# Patient Record
Sex: Female | Born: 1975 | Race: Black or African American | Hispanic: No | Smoking: Never smoker
Health system: Southern US, Community
[De-identification: ages and names within clinical notes are randomized; demographics above are authoritative.]

## PROBLEM LIST (undated history)

## (undated) DIAGNOSIS — E669 Obesity, unspecified: Secondary | ICD-10-CM

## (undated) DIAGNOSIS — Z8719 Personal history of other diseases of the digestive system: Secondary | ICD-10-CM

## (undated) DIAGNOSIS — E668 Other obesity: Secondary | ICD-10-CM

## (undated) DIAGNOSIS — R51 Headache: Secondary | ICD-10-CM

## (undated) DIAGNOSIS — E119 Type 2 diabetes mellitus without complications: Secondary | ICD-10-CM

## (undated) DIAGNOSIS — G4733 Obstructive sleep apnea (adult) (pediatric): Secondary | ICD-10-CM

## (undated) DIAGNOSIS — K219 Gastro-esophageal reflux disease without esophagitis: Secondary | ICD-10-CM

## (undated) DIAGNOSIS — Z9989 Dependence on other enabling machines and devices: Secondary | ICD-10-CM

## (undated) DIAGNOSIS — R0789 Other chest pain: Secondary | ICD-10-CM

## (undated) DIAGNOSIS — I10 Essential (primary) hypertension: Secondary | ICD-10-CM

## (undated) DIAGNOSIS — H33309 Unspecified retinal break, unspecified eye: Secondary | ICD-10-CM

## (undated) HISTORY — PX: ABDOMINAL HYSTERECTOMY: SHX81

## (undated) HISTORY — DX: Unspecified retinal break, unspecified eye: H33.309

## (undated) HISTORY — PX: TUBAL LIGATION: SHX77

## (undated) HISTORY — DX: Obstructive sleep apnea (adult) (pediatric): G47.33

---

## 1997-05-12 HISTORY — PX: WISDOM TOOTH EXTRACTION: SHX21

## 2006-09-27 ENCOUNTER — Emergency Department (HOSPITAL_COMMUNITY): Admission: EM | Admit: 2006-09-27 | Discharge: 2006-09-28 | Payer: Self-pay | Admitting: Emergency Medicine

## 2006-11-25 ENCOUNTER — Ambulatory Visit (HOSPITAL_COMMUNITY): Admission: RE | Admit: 2006-11-25 | Discharge: 2006-11-25 | Payer: Self-pay | Admitting: Obstetrics

## 2007-01-01 ENCOUNTER — Ambulatory Visit (HOSPITAL_COMMUNITY): Admission: RE | Admit: 2007-01-01 | Discharge: 2007-01-01 | Payer: Self-pay | Admitting: Obstetrics

## 2007-01-15 ENCOUNTER — Ambulatory Visit (HOSPITAL_COMMUNITY): Admission: RE | Admit: 2007-01-15 | Discharge: 2007-01-15 | Payer: Self-pay | Admitting: Obstetrics

## 2007-04-02 ENCOUNTER — Ambulatory Visit (HOSPITAL_COMMUNITY): Admission: RE | Admit: 2007-04-02 | Discharge: 2007-04-02 | Payer: Self-pay | Admitting: Obstetrics & Gynecology

## 2007-04-19 ENCOUNTER — Inpatient Hospital Stay (HOSPITAL_COMMUNITY): Admission: AD | Admit: 2007-04-19 | Discharge: 2007-04-19 | Payer: Self-pay | Admitting: Obstetrics

## 2007-05-09 ENCOUNTER — Inpatient Hospital Stay (HOSPITAL_COMMUNITY): Admission: AD | Admit: 2007-05-09 | Discharge: 2007-05-09 | Payer: Self-pay | Admitting: Obstetrics

## 2007-05-14 ENCOUNTER — Inpatient Hospital Stay (HOSPITAL_COMMUNITY): Admission: AD | Admit: 2007-05-14 | Discharge: 2007-05-14 | Payer: Self-pay | Admitting: Obstetrics & Gynecology

## 2007-05-15 ENCOUNTER — Inpatient Hospital Stay (HOSPITAL_COMMUNITY): Admission: AD | Admit: 2007-05-15 | Discharge: 2007-05-16 | Payer: Self-pay | Admitting: Obstetrics & Gynecology

## 2007-05-24 ENCOUNTER — Inpatient Hospital Stay (HOSPITAL_COMMUNITY): Admission: AD | Admit: 2007-05-24 | Discharge: 2007-05-24 | Payer: Self-pay | Admitting: Obstetrics & Gynecology

## 2007-05-25 ENCOUNTER — Inpatient Hospital Stay (HOSPITAL_COMMUNITY): Admission: AD | Admit: 2007-05-25 | Discharge: 2007-05-30 | Payer: Self-pay | Admitting: Obstetrics

## 2007-05-28 ENCOUNTER — Encounter: Payer: Self-pay | Admitting: Obstetrics & Gynecology

## 2007-09-26 ENCOUNTER — Emergency Department (HOSPITAL_COMMUNITY): Admission: EM | Admit: 2007-09-26 | Discharge: 2007-09-26 | Payer: Self-pay | Admitting: Emergency Medicine

## 2008-03-09 ENCOUNTER — Emergency Department (HOSPITAL_COMMUNITY): Admission: EM | Admit: 2008-03-09 | Discharge: 2008-03-09 | Payer: Self-pay | Admitting: Emergency Medicine

## 2008-06-13 ENCOUNTER — Emergency Department (HOSPITAL_COMMUNITY): Admission: EM | Admit: 2008-06-13 | Discharge: 2008-06-13 | Payer: Self-pay | Admitting: Emergency Medicine

## 2008-09-15 ENCOUNTER — Emergency Department (HOSPITAL_COMMUNITY): Admission: EM | Admit: 2008-09-15 | Discharge: 2008-09-16 | Payer: Self-pay | Admitting: Emergency Medicine

## 2009-03-06 ENCOUNTER — Emergency Department (HOSPITAL_COMMUNITY): Admission: EM | Admit: 2009-03-06 | Discharge: 2009-03-06 | Payer: Self-pay | Admitting: Emergency Medicine

## 2009-03-26 ENCOUNTER — Encounter: Admission: RE | Admit: 2009-03-26 | Discharge: 2009-03-26 | Payer: Self-pay | Admitting: Physician Assistant

## 2009-03-29 ENCOUNTER — Emergency Department (HOSPITAL_COMMUNITY): Admission: EM | Admit: 2009-03-29 | Discharge: 2009-03-29 | Payer: Self-pay | Admitting: Emergency Medicine

## 2009-04-11 HISTORY — PX: HERNIA REPAIR: SHX51

## 2009-09-13 ENCOUNTER — Ambulatory Visit: Payer: Self-pay | Admitting: Diagnostic Radiology

## 2009-09-13 ENCOUNTER — Emergency Department (HOSPITAL_BASED_OUTPATIENT_CLINIC_OR_DEPARTMENT_OTHER): Admission: EM | Admit: 2009-09-13 | Discharge: 2009-09-13 | Payer: Self-pay | Admitting: Emergency Medicine

## 2009-09-19 ENCOUNTER — Ambulatory Visit: Payer: Self-pay | Admitting: Family

## 2009-09-19 DIAGNOSIS — J45909 Unspecified asthma, uncomplicated: Secondary | ICD-10-CM | POA: Insufficient documentation

## 2009-09-19 DIAGNOSIS — J309 Allergic rhinitis, unspecified: Secondary | ICD-10-CM | POA: Insufficient documentation

## 2009-10-17 ENCOUNTER — Ambulatory Visit: Payer: Self-pay | Admitting: Family

## 2009-10-17 LAB — CONVERTED CEMR LAB
Albumin: 4.1 g/dL (ref 3.5–5.2)
Alkaline Phosphatase: 68 units/L (ref 39–117)
BUN: 13 mg/dL (ref 6–23)
Basophils Absolute: 0 10*3/uL (ref 0.0–0.1)
Basophils Relative: 0 % (ref 0–1)
Eosinophils Absolute: 0.6 10*3/uL (ref 0.0–0.7)
Eosinophils Relative: 7 % — ABNORMAL HIGH (ref 0–5)
Glucose, Bld: 92 mg/dL (ref 70–99)
HCT: 39.9 % (ref 36.0–46.0)
HDL: 44 mg/dL (ref >39)
Hemoglobin: 12.8 g/dL (ref 12.0–15.0)
LDL Cholesterol: 67 mg/dL (ref 0–99)
MCHC: 32.1 g/dL (ref 30.0–36.0)
MCV: 89.1 fL (ref 78.0–100.0)
Monocytes Absolute: 0.7 10*3/uL (ref 0.1–1.0)
Potassium: 5.2 meq/L (ref 3.5–5.3)
RDW: 14.3 % (ref 11.5–15.5)
Triglycerides: 83 mg/dL (ref <150)

## 2009-10-18 ENCOUNTER — Encounter: Payer: Self-pay | Admitting: Family

## 2009-10-23 ENCOUNTER — Encounter: Payer: Self-pay | Admitting: Family

## 2009-12-04 ENCOUNTER — Ambulatory Visit: Payer: Self-pay | Admitting: Internal Medicine

## 2010-03-12 LAB — HM PAP SMEAR: HM Pap smear: NORMAL

## 2010-03-13 ENCOUNTER — Ambulatory Visit: Payer: Self-pay | Admitting: Family

## 2010-03-13 ENCOUNTER — Ambulatory Visit (HOSPITAL_BASED_OUTPATIENT_CLINIC_OR_DEPARTMENT_OTHER): Admission: RE | Admit: 2010-03-13 | Discharge: 2010-03-13 | Payer: Self-pay | Admitting: Internal Medicine

## 2010-03-13 ENCOUNTER — Ambulatory Visit: Payer: Self-pay | Admitting: Diagnostic Radiology

## 2010-03-13 DIAGNOSIS — M25559 Pain in unspecified hip: Secondary | ICD-10-CM | POA: Insufficient documentation

## 2010-03-13 DIAGNOSIS — M25569 Pain in unspecified knee: Secondary | ICD-10-CM

## 2010-03-22 ENCOUNTER — Ambulatory Visit: Payer: Self-pay | Admitting: Family Medicine

## 2010-03-25 ENCOUNTER — Encounter: Payer: Self-pay | Admitting: Family Medicine

## 2010-04-17 ENCOUNTER — Ambulatory Visit: Payer: Self-pay | Admitting: Family

## 2010-04-18 ENCOUNTER — Encounter: Payer: Self-pay | Admitting: Family

## 2010-04-24 ENCOUNTER — Ambulatory Visit: Payer: Self-pay | Admitting: Family

## 2010-05-01 ENCOUNTER — Encounter: Admit: 2010-05-01 | Payer: Self-pay | Attending: Internal Medicine | Admitting: Internal Medicine

## 2010-06-04 ENCOUNTER — Ambulatory Visit
Admission: RE | Admit: 2010-06-04 | Discharge: 2010-06-04 | Payer: Self-pay | Source: Home / Self Care | Attending: Family | Admitting: Family

## 2010-06-04 ENCOUNTER — Encounter: Payer: Self-pay | Admitting: Family

## 2010-06-04 DIAGNOSIS — A088 Other specified intestinal infections: Secondary | ICD-10-CM | POA: Insufficient documentation

## 2010-06-04 LAB — CONVERTED CEMR LAB
Basophils Absolute: 0 10*3/uL (ref 0.0–0.1)
Basophils Relative: 0 % (ref 0–1)
Bilirubin, Direct: 0.1 mg/dL (ref 0.0–0.3)
Calcium: 9.1 mg/dL (ref 8.4–10.5)
Creatinine, Ser: 0.7 mg/dL (ref 0.40–1.20)
Eosinophils Absolute: 0.4 10*3/uL (ref 0.0–0.7)
Eosinophils Relative: 4 % (ref 0–5)
HCT: 37.2 % (ref 36.0–46.0)
Hemoglobin: 12.1 g/dL (ref 12.0–15.0)
Indirect Bilirubin: 0.2 mg/dL (ref 0.0–0.9)
MCHC: 32.5 g/dL (ref 30.0–36.0)
Monocytes Absolute: 0.7 10*3/uL (ref 0.1–1.0)
Neutro Abs: 4.8 10*3/uL (ref 1.7–7.7)
RDW: 13.7 % (ref 11.5–15.5)
Total Bilirubin: 0.3 mg/dL (ref 0.3–1.2)

## 2010-06-05 ENCOUNTER — Encounter: Payer: Self-pay | Admitting: Family

## 2010-06-12 NOTE — Letter (Signed)
   [place] at [facility] [address] [place], [place]  [address]  [place] Phone: [phone]      October 18, 2009   [facility] [address] [place], [place] [address]  RE:  LAB RESULTS  Dear  [name],  The following is an interpretation of your most recent lab tests.  Please take note of any instructions provided or changes to medications that have resulted from your lab work.  ELECTROLYTES:  Good - no changes needed  KIDNEY FUNCTION TESTS:  Good - no changes needed  LIVER FUNCTION TESTS:  Good - no changes needed  LIPID PANEL:  Good - no changes needed Triglyceride: 83   Cholesterol: 128   LDL: 67   HDL: 44   Chol/HDL%:  2.9 Ratio  THYROID STUDIES:  Thyroid studies normal TSH: 1.769     DIABETIC STUDIES:  Excellent - no changes needed Blood Glucose: 92    CBC:  Good - no changes needed   Sincerely Yours,    Lemont Fillers FNP

## 2010-06-12 NOTE — Letter (Signed)
   [place] at [facility] [address] [place], [place]  [address]  [place] Phone: [phone]      October 23, 2009   [facility] [address] [place], [place] [address]  RE:  LAB RESULTS  Dear  Ms. Stukey,  The following is an interpretation of your most recent lab tests.  Please take note of any instructions provided or changes to medications that have resulted from your lab work.  ELECTROLYTES:  Good - no changes needed  KIDNEY FUNCTION TESTS:  Good - no changes needed  LIVER FUNCTION TESTS:  Good - no changes needed  LIPID PANEL:  Good - no changes needed Triglyceride: 83   Cholesterol: 128   LDL: 67   HDL: 44   Chol/HDL%:  2.9 Ratio  THYROID STUDIES:  Thyroid studies normal TSH: 1.769     CBC:  Good - no changes needed   Sincerely Yours,    Lemont Fillers FNP

## 2010-06-12 NOTE — Assessment & Plan Note (Signed)
Summary: LEFT HIP PAIN/NP/LP   Vital Signs:  Patient profile:   35 year old female Height:      66.75 inches (169.55 cm) Weight:      363.8 pounds (165.36 kg) BMI:     57.61 Temp:     97.9 degrees F (36.61 degrees C) oral Pulse rate:   84 / minute BP sitting:   137 / 84  (right arm)  Vitals Entered By: Baxter Hire) (March 22, 2010 11:51 AM) CC: left hip and lower back pain Pain Assessment Patient in pain? yes     Location: lower back/hip Intensity: 2 Type: throbbing Onset of pain  pain comes and goes  Does patient need assistance? Functional Status Self care Ambulation Normal   Primary Care Provider:  Lemont Fillers FNP  CC:  left hip and lower back pain.  History of Present Illness: 35 yo F presents with intermittent (more persistent recently) low back and left leg pain  Patient reports first had pain with last pregnancy about 3 years ago Remembers falling on ice in 2004 but did not have lingering issues since then Developed low back pain/sciatica/SI joint pain per her report with pregnancy Saw a chiropractor which helped some Describes a stabbing sensation in left low back with radiation into left leg No numbness or tingling but describes feeling of 'ants running up and down my leg' at times No bowel/bladder issues Unable to stay in one position for a length of time No problems with right leg. Has not tried PT.  Has tried heat patches. Aleve irritated her stomach but tolerated ibuprofen.  Habits & Providers  Alcohol-Tobacco-Diet     Alcohol drinks/day: 0     Tobacco Status: never  Problems Prior to Update: 1)  Lumbago  (ICD-724.2) 2)  Knee Pain, Bilateral  (ICD-719.46) 3)  Hip Pain, Left  (ICD-719.45) 4)  Rhinosinusitis, Acute  (ICD-461.8) 5)  Morbid Obesity  (ICD-278.01) 6)  Preventive Health Care  (ICD-V70.0) 7)  Allergic Rhinitis  (ICD-477.9) 8)  Asthma  (ICD-493.90)  Medications Prior to Update: 1)  Albuterol Sulfate (2.5 Mg/86ml)  0.083% Nebu (Albuterol Sulfate) .... Once Daily 2)  Ventolin Hfa 108 (90 Base) Mcg/act Aers (Albuterol Sulfate) .... 2 Puffs As Needed. 3)  Zyrtec Allergy 10 Mg Caps (Cetirizine Hcl) .... One Tablet By Mouth Daily 4)  Flonase 50 Mcg/act Susp (Fluticasone Propionate) .... Two Sprays Each Nostril Once Daily 5)  One-A-Day Extras Antioxidant  Caps (Multiple Vitamins-Minerals) .... Take 1 Capsule By Mouth Once A Day. 6)  Aleve 220 Mg Tabs (Naproxen Sodium) .... One Tablet By Mouth Two Times A Day For One Week, Then Use Only On An As Needed Basis  Allergies: 1)  ! * Latex 2)  ! * Advair 3)  ! Spinach  Family History: Reviewed history from 12/04/2009 and no changes required. Cervical Dysplasia--mother Heart Disease--maternal grandmother HTN--mother, father, maternal / paternal grandparents Manic Depressive, Bipolar-- father, maternal grandmother Diabetes, Type II--mother   Social History: Reviewed history from 10/17/2009 and no changes required. 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  No scoliosis or other abnormalities. TTP in mid lumbar region, L > R paraspinal region as well.  No thoracic TTP.  Mod TTP left SI joint. FROM, pain worse with extension. Strength 4/5 with Left hip flexion, knee extension, flexion, foot dorsiflexion and plantarflexion - ?effort  or limited by pain MSRs trace in bilateral patellar tendons, 1+ in bilateral achilles tendons and equal + Fabers on left SLRs negative bilaterally 2+ dp pulses  L hip negative logroll   Impression & Recommendations:  Problem # 1:  LUMBAGO (ICD-724.2) Assessment New Patient on exam has evidence of SI joint dysfunction as well as lumbar radiculopathy.  Her weakness in left extremity does not fit with a dermatomal distribution and I believe is more limited 2/2 pain  than true weakness.  Morbid obesity a contributing factor to her pain as well - she is trying to become more active, considering joining the aquatic center in [place].  Has seen a chiropractor for back which helped some (does help with SI joint issues - can consider this again).  Treat conservatively - shown SI joint stretches and will go to physical therapy for both issues.  Flexeril for muscle spasms, try a prednisone dosepak with transition to mobic (to call if this also irritates her stomach - she tolerated ibuprofen though).  Weight loss and core strengthening are paramount to her improving in teh long term.  Her updated medication list for this problem includes:    Meloxicam 15 Mg Tabs (Meloxicam) .[name]... 1 tab by mouth daily with food - start after finishing prednisone    Flexeril 10 Mg Tabs (Cyclobenzaprine hcl) .[name]... 1 tab by mouth three times a day as needed spasms  Complete Medication List: 1)  Albuterol Sulfate (2.5 Mg/104ml) 0.083% Nebu (Albuterol sulfate) .... Once daily 2)  Ventolin Hfa 108 (90 Base) Mcg/act Aers (Albuterol sulfate) .... 2 puffs as needed. 3)  Zyrtec Allergy 10 Mg Caps (Cetirizine hcl) .... One tablet by mouth daily 4)  Flonase 50 Mcg/act Susp (Fluticasone propionate) .... Two sprays each nostril once daily 5)  One-a-day Extras Antioxidant Caps (Multiple vitamins-minerals) .... Take 1 capsule by mouth once a day. 6)  Meloxicam 15 Mg Tabs (Meloxicam) .[name].. 1 tab by mouth daily with food - start after finishing prednisone 7)  Prednisone (pak) 10 Mg Tabs (Prednisone) .... Take as directed x 6 days 8)  Flexeril 10 Mg Tabs (Cyclobenzaprine hcl) .[name].. 1 tab by mouth three times a day as needed spasms  Patient Instructions: 1)  You have evidence of SI joint dysfunction and nerve irritation coming out of your back. 2)  Take prednisone dose pack as directed - AFTER FINISHING then start the meloxicam once daily.  Make sure you take these with food. 3)  Flexeril up to three times  a day as needed for muscle spasms but no driving on this. 4)  Heat 15 minutes at a time 3-4 times a day 5)  Stay as active as possible. 6)  Go to physical therapy 2-3 times a week for 6 weeks. 7)  Strengthening your core muscles and low back muscles is important to relieve motion causing the nerve irritation in your back. 8)  Can consider massage, chiropractor, acupuncture. 9)  Follow up with me in 6 weeks for a recheck. Prescriptions: FLEXERIL 10 MG TABS (CYCLOBENZAPRINE HCL) 1 tab by mouth three times a day as needed spasms  #60 x 1   Entered and Authorized by:   Norton Blizzard MD   Signed by:   Norton Blizzard MD on 03/26/2010   Method used:   [facility]        [facility] [address] [phone]* (retail)       [address]       [place]  [place], [place]  [address]       Ph: [phone] or [phone]       Fax: [phone]   RxID:   [id number] PREDNISONE (PAK) 10 MG TABS (PREDNISONE) Take as directed x 6 days  #1 x 0   Entered and Authorized by:   Norton Blizzard MD   Signed by:   Norton Blizzard MD on 03/26/2010   Method used:   [facility]        [facility] [address] [phone]* (retail)       [address]       [place], [place]  [address]       Ph: [phone] or [phone]       Fax: [phone]   RxID:   [id number] MELOXICAM 15 MG TABS (MELOXICAM) 1 tab by mouth daily with food - start AFTER finishing prednisone  #30 x 1   Entered and Authorized by:   Norton Blizzard MD   Signed by:   Norton Blizzard MD on 03/26/2010   Method used:   [facility]        [facility] [address] [phone]* (retail)       [address]       [place], [place]  [address]       Ph: [phone] or [phone]       Fax: [phone]   RxID:   [id number] FLEXERIL 10 MG TABS (CYCLOBENZAPRINE HCL) 1 tab by mouth three times a day as needed spasms  #60 x 1   Entered and Authorized by:   Norton Blizzard MD   Signed by:    Norton Blizzard MD on 03/25/2010   Method used:   [facility]ically [facility]        [facility] [address] [phone]* (retail)       [address]       [place], [place]  [address]       Ph: [phone] or [phone]       Fax: [phone]   RxID:   [id number] PREDNISONE (PAK) 10 MG TABS (PREDNISONE) Take as directed x 6 days  #1 x 0   Entered and Authorized by:   Norton Blizzard MD   Signed by:   Norton Blizzard MD on 03/25/2010   Method used:   [facility]ronically to        [facility] [address] [phone]* (retail)       [address]       [place], [place]  [address]       Ph: [phone] or [phone]       Fax: [phone]   RxID:   [id number] MELOXICAM 15 MG TABS (MELOXICAM) 1 tab by mouth daily with food - start AFTER finishing prednisone  #30 x 1   Entered and Authorized by:   Norton Blizzard MD   Signed by:   Norton Blizzard MD on 03/25/2010   Method used:   [facility]        [facility] [address] [phone]* (retail)       [address]       [place], [place]  [address]       Ph: [phone] or [phone]       Fax: [phone]   RxID:   [id number]  Orders Added: 1)  New Patient Level III [[address]]

## 2010-06-12 NOTE — Assessment & Plan Note (Signed)
Summary: SOB/COUGH/SEEN IN ED/HEA   Vital Signs:  Patient profile:   35 year old female Height:      66.75 inches Weight:      378.75 pounds BMI:     59.98 O2 Sat:      98 % on Room air Temp:     97.7 degrees F oral Pulse rate:   112 / minute Pulse rhythm:   regular Resp:     16 per minute BP sitting:   130 / 78  (right arm) Cuff size:   thigh  Vitals Entered By: Amber Salas CMA (Sep 19, 2009 3:13 PM)  O2 Flow:  Room air CC: room 4  Pt was seen in ER Thursday for difficulty breathing. Has also been diagnosed with sinus infection. Feels like symptoms have returned. Is Patient Diabetic? No   CC:  room 4  Pt was seen in ER Thursday for difficulty breathing. Has also been diagnosed with sinus infection. Feels like symptoms have returned.[name]  History of Present Illness: Amber Salas is a 35 year old female who presents today to establish care.  She was seen in the Emergency Department last Thursday for cough/shortness of breath and was diagnosed with an Acute Asthma Exacerbation.  She was prescribed a spacer and albuterol nebs (which she is using daily) also was given a 5 day course of Prednisone.  Patient tells met that she only took the first dose but then forgot to take the remaining 4 days.  Today she notes overall breathing is feeling better, but now having clear nasal discharge which she is attributes to her allergies.  She has not had a primary care provider in some time- except a few visits in [facility] for evaluation of an umbilical hernia.  Preventive Screening-Counseling & Management  Alcohol-Tobacco     Alcohol drinks/day: 0     Smoking Status: never  Caffeine-Diet-Exercise     Caffeine use/day: 0     Does Patient Exercise: yes     Type of exercise: pilates     Exercise (avg: min/session): <30     Times/week: 3  Allergies (verified): 1)  ! 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 Resp: + cough clear "bubbles" CV:  Denies Chest Pain GI:  Denies nausea or vomitting GU: Denies dysuria Lymphatic: Denies lymphadenopathy Musculoskeletal:  Denies muscle/joint pain Skin:  Denies Rashes Psychiatric: Denies depression Neuro: Denies numbness     Physical Exam  General:  Well-developed,well-nourished,in no acute distress; alert,appropriate and cooperative throughout examination Head:  Normocephalic and atraumatic without obvious abnormalities. No apparent alopecia or balding. Lungs:  Normal respiratory effort, chest expands symmetrically. Lungs are clear to auscultation, no crackles or wheezes. Heart:  Normal rate and regular rhythm. S1 and S2 normal without gallop, murmur, click, rub or other extra sounds.   Impression & Recommendations:  Problem # 1:  ASTHMA (ICD-493.90) Assessment Improved Symptoms have improved since her visit to the ED.  Pt instructed to complete prednisone  taper.  Will add Advair as maintenence medication.  Continue as needed albuterol nebs/MDI Her updated medication list for this problem includes:    Albuterol Sulfate (2.5 Mg/30ml) 0.083% Nebu (Albuterol sulfate) ..... Once daily    Ventolin Hfa 108 (90 Base) Mcg/act Aers (Albuterol sulfate) .[name]... 2 puffs as needed.  Problem # 2:  ALLERGIC RHINITIS (ICD-477.9) Assessment: New Will add zyrtec for nasal congestion.   Her updated  medication list for this problem includes:    Zyrtec Allergy 10 Mg Caps (Cetirizine hcl) ..... One tablet by mouth daily  Complete Medication List: 1)  Albuterol Sulfate (2.5 Mg/71ml) 0.083% Nebu (Albuterol sulfate) .... Once daily 2)  Ventolin Hfa 108 (90 Base) Mcg/act Aers (Albuterol sulfate) .... 2 puffs as needed. 3)  Zyrtec Allergy 10 Mg Caps (Cetirizine hcl) .... One tablet by mouth daily  Patient Instructions: 1)  Please complete your prednisone. 2)  Call if worsening shortness of breath. 3)  Please return in 1-2 weeks for a complete physical.  Come fasting to this appointment.   Preventive Care Screening  Pap Smear:    Date:  03/12/2009    Results:  normal    Current Allergies (reviewed today): ! * LATEX  Appended Document: SOB/COUGH/SEEN IN ED/HEA Extremities without edema Psych: A and  O x 3, calm and pleasant

## 2010-06-12 NOTE — Assessment & Plan Note (Signed)
Summary: cpx patient fasting/mhf--Rm 4   Vital Signs:  Patient profile:   35 year old female Height:      66.75 inches Weight:      378.75 pounds BMI:     59.98 Temp:     98.0 degrees F oral Pulse rate:   84 / minute Pulse rhythm:   regular Resp:     16 per minute BP sitting:   130 / 78  (right arm) Cuff size:   thigh  Vitals Entered By: Mervin Kung CMA (October 17, 2009 9:31 AM) CC: Rm 4  Physical.  Needs refill on Albuterol for nebulizer and Zyrtec. Is Patient Diabetic? No   CC:  Rm 4  Physical.  Needs refill on Albuterol for nebulizer and Zyrtec.[name]  History of Present Illness: [name] is a 35 year old female who presents today for complete physical.   Preventative- Has been exercising regularly.  Pap smear was up to date.  Last tetanus was greater than 10 years ago.  Has been working hard on diet.    1)Asthma- improved since starting zyrtec, did not tolerate advair.  2)Morbid obesity- having trouble losing weight despite exercise/healthy eating.   3)allergic rhinitis- continues with mild cough and post nasal drip despite zyrtec  Preventive Screening-Counseling & Management  Alcohol-Tobacco     Alcohol drinks/day: 0     Smoking Status: never  Caffeine-Diet-Exercise     Caffeine use/day: none     Does Patient Exercise: yes     Type of exercise: dance video     Exercise (avg: min/session): 30-60     Times/week: 3  Allergies: 1)  ! * Latex 2)  ! 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 Resp: Denies cough CV:  Denies Chest Pain GI:  Denies nausea or vomitting GU: Denies dysuria Lymphatic: Denies lymphadenopathy Musculoskeletal:  occasional right knee pain Skin:  Denies Rashes Psychiatric: Denies depression or  anxiety Neuro: some numbness and swelling in left leg following "pinched nerve" from epidural 2 years ago     Physical Exam  General:  Pleasant, morbidly obese AA female, awake, alert, NAD Head:  Normocephalic and atraumatic without obvious abnormalities. No apparent alopecia or balding. Eyes:  PERRLA Ears:  External ear exam shows no significant lesions or deformities.  Otoscopic examination reveals clear canals, tympanic membranes are intact bilaterally without bulging, retraction, inflammation or discharge. Hearing is grossly normal bilaterally. Mouth:  Oral mucosa and oropharynx without lesions or exudates.  Teeth in good repair. Neck:  No deformities, masses, or tenderness noted. Breasts:  No mass, nodules, thickening, tenderness, bulging, retraction, inflamation, nipple discharge or skin changes noted.   Lungs:  Normal respiratory effort, chest expands symmetrically. Lungs are clear to auscultation, no crackles or wheezes. Heart:  Normal rate and regular rhythm. S1 and S2 normal without gallop, murmur, click, rub or other extra sounds. Abdomen:  Bowel sounds positive,abdomen soft and non-tender without masses, organomegaly or hernias noted. Genitalia:  deferred Extremities:  No clubbing, cyanosis, edema, or deformity noted with normal full range of motion of all joints.   Neurologic:  alert & oriented X3, cranial nerves II-XII intact, strength normal in all extremities, and gait normal.  DTR's limited by habitus Skin:  Intact without suspicious lesions  or rashes Cervical Nodes:  No lymphadenopathy noted Axillary Nodes:  No palpable lymphadenopathy Psych:  Cognition and judgment appear intact. Alert and cooperative with normal attention span and concentration. No apparent delusions, illusions, hallucinations   Impression & Recommendations:  Problem # 1:  Preventive Health Care (ICD-V70.0) Assessment Comment Only Immunizations reviewed- patient due for Td which was given today.   Patient educated on diet, exercise and weight loss.  Will plan referral to the Weight Loss Center  Orders: T-Comprehensive Metabolic Panel [phone]) T-CBC w/Diff [phone]) T-TSH [phone]) T-Lipid Profile [phone])  Problem # 2:  ALLERGIC RHINITIS (ICD-477.9)  Her updated medication list for this problem includes:    Zyrtec Allergy 10 Mg Caps (Cetirizine hcl) ..... One tablet by mouth daily    Flonase 50 Mcg/act Susp (Fluticasone propionate) .[name]..[name] Two sprays each nostril once daily  Problem # 3:  ASTHMA (ICD-493.90) Assessment: Improved didn't tolerate advair, has been well controlled since starting zyrtec. Will hold off on any further inhaled steroids at this time. Her updated medication list for this problem includes:    Albuterol Sulfate (2.5 Mg/54ml) 0.083% Nebu (Albuterol sulfate) ..... Once daily    Ventolin Hfa 108 (90 Base) Mcg/act Aers (Albuterol sulfate) .[name]... 2 puffs as needed.  Problem # 4:  MORBID OBESITY (ICD-278.01)  Requests referral to weight loss clinic.   Orders: Misc. Referral (Misc. Ref)  Complete Medication List: 1)  Albuterol Sulfate (2.5 Mg/67ml) 0.083% Nebu (Albuterol sulfate) .... Once daily 2)  Ventolin Hfa 108 (90 Base) Mcg/act Aers (Albuterol sulfate) .... 2 puffs as needed. 3)  Zyrtec Allergy 10 Mg Caps (Cetirizine hcl) .... One tablet by mouth daily 4)  Flonase 50 Mcg/act Susp (Fluticasone propionate) .... Two sprays each nostril once daily  Other Orders: Tdap => 67yrs IM (36644) Admin 1st Vaccine (03474)  Patient Instructions: 1)  You will be contacted about your referral to the Weight Loss Clinic. 2)  Try to keep to 1200-1500 calories a day. 3)  Exercise 30 minutes every day. 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 5)  Follow up in 6 months so that we can monitor your weight loss progress, sooner if problems or concerns. Prescriptions: FLONASE 50 MCG/ACT SUSP (FLUTICASONE PROPIONATE) two sprays each nostril once daily  #1 x 2    Entered and Authorized by:   Lemont Fillers FNP   Signed by:   Lemont Fillers FNP on 10/17/2009   Method used:   [facility]        [facility] [address] [phone]* (retail)       [address]       [place], [place]  [address]       Ph: [phone] or [phone]       Fax: [phone]   RxID:   [id number]    Current Allergies (reviewed today): ! * LATEX ! * ADVAIR      Immunizations Administered:  Tetanus Vaccine:    Vaccine Type: Tdap    Site: right deltoid    Mfr: [employer]oSmith[employer]    Dose: 0.5 ml    Route: IM    Given by: Mervin Kung CMA    Exp. Date: 08/03/2011    Lot #: [id number]

## 2010-06-12 NOTE — Assessment & Plan Note (Signed)
Summary: sinus infection/mhf   Vital Signs:  Patient profile:   35 year old female Weight:      371.75 pounds BMI:     58.87 O2 Sat:      98 % on Room air Temp:     98.0 degrees F oral Pulse rate:   93 / minute Pulse rhythm:   regular Resp:     20 per minute BP sitting:   122 / 90  (left arm) Cuff size:   Thigh  Vitals Entered By: Glendell Docker CMA (December 04, 2009 11:48 AM)  O2 Flow:  Room air CC: Rm 3-  sinus discomfort Is Patient Diabetic? No Pain Assessment Patient in pain? no      Comments c/o sinus pressure , nasal drainage yellow in color, throat pain, denies temp- not checked at home, taken sinus congestion,  and theraflu with no relief, ongoing for the past 3 days. She state this is her 3rd infection ithis year   Primary Care Provider:  Lemont Fillers FNP  CC:  Rm 3-  sinus discomfort.  History of Present Illness: 35 yo AA female co sinus congestion and purulent drainage  onset 3 days left sided facial pressure 3 sinus infections within 6 months to 1 yr no previous imaging  she has hx of allegic rhinitis no pets at home prev skin test showed sensitivity to grass and pet dander  Preventive Screening-Counseling & Management  Alcohol-Tobacco     Smoking Status: never  Allergies: 1)  ! * Latex 2)  ! * Advair 3)  ! Spinach  Past History:  Past Medical History: Hx of asthma Murmur    Family History: Cervical Dysplasia--mother Heart Disease--maternal grandmother HTN--mother, father, maternal / paternal grandparents Manic Depressive, Bipolar-- father, maternal grandmother Diabetes, Type II--mother   Physical Exam  General:  alert, well-developed, and well-nourished.   Ears:  R ear normal and L ear normal.   Mouth:  pharynx pink and moist.   Lungs:  normal respiratory effort and normal breath sounds.   Heart:  normal rate, regular rhythm, and no gallop.     Impression & Recommendations:  Problem # 1:  RHINOSINUSITIS, ACUTE  (ICD-461.8)  Her updated medication list for this problem includes:    Flonase 50 Mcg/act Susp (Fluticasone propionate) .[name]..[name] Two sprays each nostril once daily    Cefuroxime Axetil 500 Mg Tabs (Cefuroxime axetil) ..... One by mouth bid  Instructed on treatment. Call if symptoms persist or worsen.  Use nasal saline irrigation If persistent infection, consider CT of sinuses  Complete Medication List: 1)  Albuterol Sulfate (2.5 Mg/8ml) 0.083% Nebu (Albuterol sulfate) .... Once daily 2)  Ventolin Hfa 108 (90 Base) Mcg/act Aers (Albuterol sulfate) .... 2 puffs as needed. 3)  Zyrtec Allergy 10 Mg Caps (Cetirizine hcl) .... One tablet by mouth daily 4)  Flonase 50 Mcg/act Susp (Fluticasone propionate) .... Two sprays each nostril once daily 5)  Cefuroxime Axetil 500 Mg Tabs (Cefuroxime axetil) .... One by mouth bid  Patient Instructions: 1)  Patient advised to call office if symptoms persist or worsen. 2)  The highlighted prescriptions were electronically sent to your pharmacy 3)  Use nasal saline irrigation regularly Prescriptions: CEFUROXIME AXETIL 500 MG TABS (CEFUROXIME AXETIL) one by mouth bid  #20 x 0   Entered and Authorized by:   D. Thomos Lemons DO   Signed by:   D. Thomos Lemons DO on 12/04/2009   Method used:   [facility] [address] [phone]* (retail)       [address]       [place], [place]  [address]       Ph: [phone] or [phone]       Fax: [phone]   RxID:   [id number]   Current Allergies (reviewed today): ! * LATEX ! * ADVAIR ! SPINACH

## 2010-06-12 NOTE — Assessment & Plan Note (Signed)
Summary: leg swelling/mhf rsch per pt/dt--Rm 5   Vital Signs:  Patient profile:   35 year old female Height:      66.75 inches Weight:      367.25 pounds BMI:     58.16 Temp:     98.0 degrees F oral Pulse rate:   90 / minute Pulse rhythm:   regular Resp:     18 per minute BP sitting:   130 / 84  (right arm) Cuff size:   thigh  Vitals Entered By: Mervin Kung CMA Duncan Dull) (March 13, 2010 10:09 AM) CC: Rm 5  Pt states she is having pain and tingling in left leg down to the foot. States she had injury to her joint during delivery of her son. Is Patient Diabetic? No Pain Assessment Patient in pain? yes     Location: left hip down to the foot Comments Pt has completed Flonase and cefuroxime. Nicki Guadalajara Fergerson CMA Duncan Dull)  March 13, 2010 10:16 AM    Primary Care [place]ot:  Lemont Fillers FNP  CC:  Rm 5  Pt states she is having pain and tingling in left leg down to the foot. States she had injury to her joint during delivery of her son.[name]  History of Present Illness: Ms Kozicki isa 34 year old female who presents today with complaint of Left leg pain/swelling.  Pain starts in the left lower back and then radiates down her left leg.  Associated with tightness and intermittent swelling. Pain is worsened by being in one position. Pain is improved by ibuprofen.   Allergies: 1)  ! * Latex 2)  ! * Advair 3)  ! Spinach  Past History:  Past Medical History: Last updated: 12/04/2009 Hx of asthma Murmur    Past Surgical History: Last updated: 09/19/2009 abdominal hernia repair--12/10 c-section--4/04 wisdom teeth extraction--1999  Physical Exam  General:  Morbidly obese female, awake, alert and in NAD Head:  Normocephalic and atraumatic without obvious abnormalities. No apparent alopecia or balding. Lungs:  Normal respiratory effort, chest expands symmetrically. Lungs are clear to auscultation, no crackles or wheezes. Heart:  Normal rate and regular rhythm. S1 and  S2 normal without gallop, murmur, click, rub or other extra sounds. Msk:  Bilateral LE strength is 5/5.  + pain with extention and adduction of left hip.    Extremities:  No clubbing, cyanosis, edema, or deformity noted  Neurologic:  Difficulty eliciting patellar reflexes due to habitus   Detailed Back/Spine Exam  Lumbosacral Exam:  Toe Walking:    Right:  normal    Left:  abnormal Heel Walking:    Right:  normal    Left:  normal   Impression & Recommendations:  Problem # 1:  HIP PAIN, LEFT (ICD-719.45) Assessment Deteriorated Patient has left hip pain with some pain which seems to radiate down the left leg, ? sciatic component.  + history of SI injury during childbirth.  X-ray of LS spine today notes vacuum SI joint phenomena and neg lumbar spine film.  Will plan to refer to Sports medicine for ? SI injection and also to PT.  Recommended short term use of Aleve for pain.   Orders: Physical Therapy Referral (PT) Sports Medicine (Sports Med)  Her updated medication list for this problem includes:    Aleve 220 Mg Tabs (Naproxen sodium) ..... One tablet by mouth two times a day for one week, then use only on an as needed basis  Complete Medication List: 1)  Albuterol Sulfate (2.5  Mg/66ml) 0.083% Nebu (Albuterol sulfate) .... Once daily 2)  Ventolin Hfa 108 (90 Base) Mcg/act Aers (Albuterol sulfate) .... 2 puffs as needed. 3)  Zyrtec Allergy 10 Mg Caps (Cetirizine hcl) .... One tablet by mouth daily 4)  Flonase 50 Mcg/act Susp (Fluticasone propionate) .... Two sprays each nostril once daily 5)  One-a-day Extras Antioxidant Caps (Multiple vitamins-minerals) .... Take 1 capsule by mouth once a day. 6)  Aleve 220 Mg Tabs (Naproxen sodium) .... One tablet by mouth two times a day for one week, then use only on an as needed basis  Other Orders: Lumbar Spine Complete, 5 Views [phone])  Patient Instructions: 1)  Try uploading [email or web address].com to your phone  for a convenient calorie  counter. 2)  Try to keep your calories to 1200-1400kcal a day. 3)  Try to exercise 30 minutes 5 days a week. 4)  Complete your x-ray downstairs today. 5)  You will be contacted about your referral to physical therapy. 6)  Follow up in 6 weeks.    Orders Added: 1)  Lumbar Spine Complete, 5 Views [71110TC] 2)  Physical Therapy Referral [PT] 3)  Sports Medicine [Sports Med] 4)  Est. Patient Level III [45409]    Current Allergies (reviewed today): ! * LATEX ! * ADVAIR ! SPINACH

## 2010-06-13 NOTE — Assessment & Plan Note (Signed)
Summary: N & V  /hea--rm 5   Vital Signs:  Patient profile:   35 year old female Height:      66.75 inches Weight:      370 pounds BMI:     58.60 Temp:     97.1 degrees F oral Pulse rate:   90 / minute Pulse rhythm:   regular Resp:     18 per minute BP sitting:   140 / 70  (right arm) Cuff size:   thigh  Vitals Entered By: Mervin Kung CMA Duncan Dull) (June 04, 2010 12:01 PM) CC: Pt has had vomiting x 2 days. Is Patient Diabetic? No Pain Assessment Patient in pain? no      Comments Pt states she has never taken Meloxicam. Has completed prednisone. Nicki Guadalajara Fergerson CMA Duncan Dull)  June 04, 2010 12:08 PM    Primary Care Provider:  Lemont Fillers FNP  CC:  Pt has had vomiting x 2 days.[name]  History of Present Illness: Ms.  Kirchman is a 35 year old female who presents today with chief complaint of vomitting.  Symptoms started 2 days ago.  Denies hematemesis or diarrhea.   + epigastric pain in her lower abdomen since yesterday.   Denies known fever.  Feels weak.  Improved by nothing.  Has not taken anything over the counter.  Denies sick contacts.  Allergies: 1)  ! * Latex 2)  ! * Advair 3)  ! Spinach  Past History:  Past Medical History: Last updated: 12/04/2009 Hx of asthma Murmur    Past Surgical History: Last updated: 09/19/2009 abdominal hernia repair--12/10 c-section--4/04 wisdom teeth extraction--1999  Review of Systems       see HPI  Physical Exam  General:  Morbidly obese AA female, awake, alert and ,in no acute distress; alert,appropriate and cooperative throughout examination Head:  Normocephalic and atraumatic without obvious abnormalities. No apparent alopecia or balding. Lungs:  Normal respiratory effort, chest expands symmetrically. Lungs are clear to auscultation, no crackles or wheezes. Heart:  Normal rate and regular rhythm. S1 and S2 normal without gallop, murmur, click, rub or other extra sounds. Abdomen:  generalized abdominal  ternderness without guarding or distention.  + bowel sounds noted.   Psych:  Cognition and judgment appear intact. Alert and cooperative with normal attention span and concentration. No apparent delusions, illusions, hallucinations   Impression & Recommendations:  Problem # 1:  GASTROENTERITIS, VIRAL, ACUTE (ICD-008.8) Assessment New Will check labs as listed below.  Phenergan given today in the office.  Patient was instructed on hydration and follow up as outlined in pt. sign out sheet. Orders: TLB-BMP (Basic Metabolic Panel-BMET) (80048-METABOL) TLB-CBC Platelet - w/Differential (85025-CBCD) TLB-Hepatic/Liver Function Pnl (80076-HEPATIC) Promethazine up to 50mg  (J2550) Admin of Therapeutic Inj  intramuscular or subcutaneous ([address])  Complete Medication List: 1)  Albuterol Sulfate (2.5 Mg/59ml) 0.083% Nebu (Albuterol sulfate) .... Once daily 2)  Ventolin Hfa 108 (90 Base) Mcg/act Aers (Albuterol sulfate) .... 2 puffs as needed. 3)  Zyrtec Allergy 10 Mg Caps (Cetirizine hcl) .... One tablet by mouth daily 4)  Flonase 50 Mcg/act Susp (Fluticasone propionate) .... Two sprays each nostril once daily 5)  One-a-day Extras Antioxidant Caps (Multiple vitamins-minerals) .... Take 1 capsule by mouth once a day. 6)  Meloxicam 15 Mg Tabs (Meloxicam) .[name].. 1 tab by mouth daily with food - start after finishing prednisone 7)  Flexeril 10 Mg Tabs (Cyclobenzaprine hcl) .[name].. 1 tab by mouth three times a day as needed spasms 8)  Zofran 4 Mg Tabs (  Ondansetron hcl) .... One tablet by mouth every 8 hours as needed for anxiety  Patient Instructions: 1)  Please drink small frequent sips of fluids (popsicles, gatorade etc.) 2)  If you are unable to keep down fluids despite use of zofran, then you will need to go to the ER. 3)  Call us if your symptoms are not improved in 48 hours. Prescriptions: ZOFRAN 4 MG TABS (ONDANSETRON HCL) one tablet by mouth every 8 hours as needed for anxiety  #20 x 0   Entered and  Authorized by:   Lemont Fillers FNP   Signed by:   Lemont Fillers FNP on 06/04/2010   Method used:   [facility]        [facility] [address] [phone]* (retail)       [address]       [place], [place]  [address]       Ph: [phone] or [phone]       Fax: [phone]   RxID:   [id number]    Medication Administration  Injection # 1:    Medication: Promethazine up to 50mg     Diagnosis: GASTROENTERITIS, VIRAL, ACUTE (ICD-008.8)    Route: IM    Site: RUOQ gluteus    Exp Date: 05/12/2011    Lot #: [id number]    Mfr: [employer]    Comments: Pt was given 25mg .    Patient tolerated injection without complications    Given by: Mervin Kung CMA Duncan Dull) (June 04, 2010 2:27 PM)  Orders Added: 1)  TLB-BMP (Basic Metabolic Panel-BMET) [80048-METABOL] 2)  TLB-CBC Platelet - w/Differential [85025-CBCD] 3)  TLB-Hepatic/Liver Function Pnl [80076-HEPATIC] 4)  Promethazine up to 50mg  [J2550] 5)  Admin of Therapeutic Inj  intramuscular or subcutaneous [96372] 6)  Est. Patient Level III [30160]    Current Allergies (reviewed today): ! * LATEX ! * ADVAIR ! SPINACH

## 2010-06-13 NOTE — Letter (Signed)
Summary: Unable to Reach Patient/[facility]  [facility]/ [facility]   Imported By: Lanelle Bal 04/27/2010 10:59:29  _____________________________________________________________________  External Attachment:    Type:   Image     Comment:   External Document

## 2010-06-13 NOTE — Letter (Signed)
   [place] [facility] [address] [place], [place]  [address]  [place] Phone: [phone]      June 05, 2010   [facility] [address] [place], [place] [address]  RE:  LAB RESULTS  Dear  Ms. Felling,  The following is an interpretation of your most recent lab tests.  Please take note of any instructions provided or changes to medications that have resulted from your lab work.  ELECTROLYTES:  Good - no changes needed  KIDNEY FUNCTION TESTS:  Good - no changes needed  LIVER FUNCTION TESTS:  Good - no changes needed    CBC:  Good - no changes needed Your lab work is all normal.  I hope that you are feeling better.  Please let us know if your symptoms are not improved.   Sincerely Yours,    Lemont Fillers FNP  Appended Document:  Mailed.

## 2010-07-11 ENCOUNTER — Ambulatory Visit: Payer: Self-pay | Admitting: Internal Medicine

## 2010-07-12 ENCOUNTER — Encounter: Payer: Self-pay | Admitting: Family

## 2010-07-12 ENCOUNTER — Ambulatory Visit (INDEPENDENT_AMBULATORY_CARE_PROVIDER_SITE_OTHER): Payer: Managed Care, Other (non HMO) | Admitting: Family

## 2010-07-12 DIAGNOSIS — J029 Acute pharyngitis, unspecified: Secondary | ICD-10-CM

## 2010-07-12 DIAGNOSIS — R350 Frequency of micturition: Secondary | ICD-10-CM | POA: Insufficient documentation

## 2010-07-12 DIAGNOSIS — J329 Chronic sinusitis, unspecified: Secondary | ICD-10-CM | POA: Insufficient documentation

## 2010-07-12 LAB — CONVERTED CEMR LAB
Bilirubin Urine: NEGATIVE
Ketones, urine, test strip: NEGATIVE
Protein, U semiquant: NEGATIVE
Rapid Strep: NEGATIVE
Urobilinogen, UA: 0.2
pH: 6.5

## 2010-07-18 NOTE — Assessment & Plan Note (Signed)
Summary: sinus infection/ss--rm 4   Vital Signs:  Patient profile:   35 year old female Height:      66.75 inches Weight:      371.75 pounds BMI:     58.87 Temp:     97.9 degrees F oral Pulse rate:   84 / minute Pulse rhythm:   regular Resp:     18 per minute BP sitting:   122 / 78  (right arm) Cuff size:   thigh  Vitals Entered By: Mervin Kung CMA Duncan Dull) (July 12, 2010 8:22 AM) CC: Pt state she has had sore throat, productive cough and head congestion x 3 days. Also has had urinary frequency x 1 day. Is Patient Diabetic? No   Primary Care Provider:  Lemont Fillers FNP  CC:  Pt state she has had sore throat and productive cough and head congestion x 3 days. Also has had urinary frequency x 1 day.[name]  History of Present Illness: Ms.  Scoggin is a 35 year old female who presents with 3 day history of nasal congestion and sore throat. Nasal congestion is green/yellow in the AM, then clearer as the day moves on. Symptoms started 3 days ago.  Denies associated fever, nausea and vomitting.  Symptoms are associated with cough (yellow with streaks of blood).  Tried Dayquil yesterday which helped to relieve sinus pressure.    Pt also complains of urinary frequency.  Allergies: 1)  ! * Latex 2)  ! * Advair 3)  ! Spinach  Past History:  Past Medical History: Last updated: 12/04/2009 Hx of asthma Murmur    Past Surgical History: Last updated: 09/19/2009 abdominal hernia repair--12/10 c-section--4/04 wisdom teeth extraction--1999  Review of Systems       see HPI  Physical Exam  General:  Morbidly obese female, awake, alert and in NAD Ears:  External ear exam shows no significant lesions or deformities.  Otoscopic examination reveals clear canals, tympanic membranes are intact bilaterally without bulging, retraction, inflammation or discharge. Hearing is grossly normal bilaterally. Mouth:  Oral mucosa and oropharynx without lesions or exudates.  Teeth in good  repair. Lungs:  Normal respiratory effort, chest expands symmetrically. Lungs are clear to auscultation, no crackles or wheezes. Heart:  Normal rate and regular rhythm. S1 and S2 normal without gallop, murmur, click, rub or other extra sounds.   Impression & Recommendations:  Problem # 1:  SINUSITIS (ICD-473.9) Assessment New Will treat with amoxicillin.   Her updated medication list for this problem includes:    Flonase 50 Mcg/act Susp (Fluticasone propionate) .[name]..[name] Two sprays each nostril once daily as needed.    Amoxicillin 500 Mg Cap (Amoxicillin) .[name]... Take 2 capsules by mouth three times a day x 10 days  Her updated medication list for this problem includes:    Flonase 50 Mcg/act Susp (Fluticasone propionate) .[name]..[name] Two sprays each nostril once daily as needed.  Problem # 2:  FREQUENCY, URINARY (ICD-788.41) Assessment: New UA is negative.  Sugar was stable last visit.  Will monitor for now.   Orders: UA Dipstick w/o Micro (manual) ([address])  Complete Medication List: 1)  Albuterol Sulfate (2.5 Mg/39ml) 0.083% Nebu (Albuterol sulfate) .... Once daily as needed. 2)  Ventolin Hfa 108 (90 Base) Mcg/act Aers (Albuterol sulfate) .... 2 puffs as needed. 3)  Zyrtec Allergy 10 Mg Caps (Cetirizine hcl) .... One tablet by mouth daily as needed. 4)  Flonase 50 Mcg/act Susp (Fluticasone propionate) .... Two sprays each nostril once daily as needed. 5)  One-a-day Extras Antioxidant Caps (Multiple vitamins-minerals) .... Take 1 capsule by mouth once a day. 6)  Amoxicillin 500 Mg Cap (Amoxicillin) .... Take 2 capsules by mouth three times a day x 10 days  Other Orders: Rapid Strep ([address])  Patient Instructions: 1)  Call if you develop fever over 101, increasing sinus pressure, pain with eye movement, increased facial tenderness of swelling, or if you develop visual changes. 2)  Call if your symptoms are not improved in 48-72 hours. Prescriptions: AMOXICILLIN 500 MG CAP (AMOXICILLIN) Take 2  capsules by mouth three times a day X 10 days  #60 x 0   Entered and Authorized by:   Lemont Fillers FNP   Signed by:   Lemont Fillers FNP on 07/12/2010   Method used:   [facility]        [facility] [address] [phone]* (retail)       [address]       [place], [place]  [address]       Ph: [phone] or [phone]       Fax: [phone]   RxID:   [id number]    Orders Added: 1)  Rapid Strep [03474] 2)  UA Dipstick w/o Micro (manual) [81002] 3)  Est. Patient Level III [25[address]]    Current Allergies (reviewed today): ! * LATEX ! * ADVAIR ! [facility] Laboratory Results   Urine Tests   Date/Time Reported: Mervin Kung CMA Duncan Dull)  July 12, 2010 8:43 AM   Routine Urinalysis   Color: yellow Appearance: Clear Glucose: negative   (Normal Range: Negative) Bilirubin: negative   (Normal Range: Negative) Ketone: negative   (Normal Range: Negative) Spec. Gravity: 1.010   (Normal Range: 1.003-1.035) Blood: negative   (Normal Range: Negative) pH: 6.5   (Normal Range: 5.0-8.0) Protein: negative   (Normal Range: Negative) Urobilinogen: 0.2   (Normal Range: 0-1) Nitrite: negative   (Normal Range: Negative) Leukocyte Esterace: negative   (Normal Range: Negative)      Other Tests  Rapid Strep: negative  Kit Test Internal QC: Positive   (Normal Range: Negative)

## 2010-07-23 ENCOUNTER — Encounter: Payer: Managed Care, Other (non HMO) | Admitting: Family

## 2010-07-29 ENCOUNTER — Encounter: Payer: Self-pay | Admitting: Family

## 2010-07-30 ENCOUNTER — Ambulatory Visit (INDEPENDENT_AMBULATORY_CARE_PROVIDER_SITE_OTHER): Payer: Managed Care, Other (non HMO) | Admitting: Family

## 2010-07-30 ENCOUNTER — Other Ambulatory Visit (HOSPITAL_COMMUNITY)
Admission: RE | Admit: 2010-07-30 | Discharge: 2010-07-30 | Disposition: A | Discharge status: Home / Self Care | Payer: Managed Care, Other (non HMO) | Source: Ambulatory Visit | Attending: Internal Medicine | Admitting: Internal Medicine

## 2010-07-30 ENCOUNTER — Encounter: Payer: Self-pay | Admitting: Family

## 2010-07-30 VITALS — BP 118/88 | HR 84 | Temp 98.5°F | Resp 16 | Ht 66.0 in | Wt 377.0 lb

## 2010-07-30 DIAGNOSIS — Z01419 Encounter for gynecological examination (general) (routine) without abnormal findings: Secondary | ICD-10-CM | POA: Insufficient documentation

## 2010-07-30 DIAGNOSIS — Z124 Encounter for screening for malignant neoplasm of cervix: Secondary | ICD-10-CM

## 2010-07-30 LAB — BASIC METABOLIC PANEL WITH GFR
BUN: 15 mg/dL (ref 6–23)
CO2: 28 meq/L (ref 19–32)
Calcium: 9.3 mg/dL (ref 8.4–10.5)
Glucose, Bld: 100 mg/dL — ABNORMAL HIGH (ref 70–99)
Sodium: 145 meq/L (ref 135–145)

## 2010-07-30 LAB — BASIC METABOLIC PANEL
Chloride: 107 mEq/L (ref 96–112)
Creatinine, Ser: 0.8 mg/dL (ref 0.4–1.2)
GFR calc Af Amer: >60 mL/min (ref >60)
GFR calc non Af Amer: >60 mL/min (ref >60)
Potassium: 4.6 mEq/L (ref 3.5–5.1)

## 2010-07-30 LAB — POCT CARDIAC MARKERS
CKMB, poc: <1 ng/mL — ABNORMAL LOW (ref 1.0–8.0)
Myoglobin, poc: 42.4 ng/mL (ref 12–200)
Troponin i, poc: <0.05 ng/mL (ref 0.00–0.09)

## 2010-07-30 MED ORDER — FLUTICASONE PROPIONATE 50 MCG/ACT NA SUSP: 2.0000 | Freq: Every day | NASAL | Status: DC | PRN

## 2010-07-30 NOTE — Progress Notes (Signed)
Subjective:    Patient ID: Amber Salas, female    DOB: 03/04/1976, 35 y.o.   MRN: [id number]  Ms.  Amber Salas is a 35 year old female who presents today for her pap smear.  She reports that her last pap smear was 11/10.  She denies history of abnormal pap.    Gynecologic Exam The patient's pertinent negatives include no genital itching or genital odor. Pertinent negatives include no abdominal pain, dysuria, fever, frequency or nausea. She is sexually active. She uses tubal ligation for contraception. Her menstrual history has been regular.      Review of Systems  Constitutional: Negative for fever.  Gastrointestinal: Negative for nausea and abdominal pain.  Genitourinary: Negative for dysuria and frequency.   Past Medical History  Diagnosis Date  . Asthma     history of  . Murmur, cardiac     History   Social History  . Marital Status: Married    Spouse Name: N/A    Number of Children: N/A  . Years of Education: N/A   Occupational History  . Not on file.   Social History Main Topics  . Smoking status: Never Smoker   . Smokeless tobacco: Never Used  . Alcohol Use: No  . Drug Use: Not on file  . Sexually Active: Not on file   Other Topics Concern  . 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    Past Surgical History  Procedure Date  . Cesarean section 4/04  . Hernia repair 04/2009    abdominal hernia  . Wisdom tooth extraction 1999    Family History  Problem Relation Age of Onset  . Hypertension Mother   . Diabetes Mother     type II  . Other Mother     cervical dysplasia  . Hypertension Father   . Bipolar disorder Father   . Heart disease Maternal Grandmother   . Hypertension Maternal Grandmother   . Bipolar disorder Maternal Grandmother   . Hypertension Maternal Grandfather   . Hypertension Paternal Grandmother   . Hypertension Paternal  Grandfather     Allergies  Allergen Reactions  . Advair Hfa     REACTION: Palpitations, chest pain  . Latex     REACTION: wheezing, rash  . Spinach     REACTION: Rash    Current Outpatient Prescriptions on File Prior to Visit  Medication Sig Dispense Refill  . albuterol (PROVENTIL HFA) 108 (90 BASE) MCG/ACT inhaler Inhale 2 puffs into the lungs as needed.        [name] albuterol (PROVENTIL) (2.5 MG/3ML) 0.083% nebulizer solution Use once daily as needed.       . cetirizine (ZYRTEC) 10 MG tablet Take 10 mg by mouth daily as needed.        . Multiple Vitamins-Minerals (ONE-A-DAY EXTRAS ANTIOXIDANT) CAPS Take 1 capsule by mouth daily.        [name] DISCONTD: fluticasone (FLONASE) 50 MCG/ACT nasal spray 2 sprays by Nasal route daily as needed.         BP 118/88  Pulse 84  Temp(Src) 98.5 F (36.9 C) (Oral)  Resp 16  Ht 5\' 6"  (1.676 m)  Wt 377 lb (171.006 kg)  BMI 60.85 kg/m2  LMP 07/23/2010       Objective:   Physical Exam Gen:  Morbidly obese AA female, awake, alert and in NAD Cv: S1/S2, RRR Resp:  BS CTA bilaterally, no wheezes rales, or rhonchi Abdomen: soft, non-tender, non-distended.  + BS GYN:  Normal external genitalia.  No adnexal fullness noted.  Uterus is not enlarged.  No abnormal vaginal discharge. Breast:  No masses noted Axillary LN:  No axillary LAD.        Assessment & Plan:

## 2010-07-30 NOTE — Assessment & Plan Note (Signed)
Normal GYN exam today.  Pap was performed.  Pt to return in June for cpx.

## 2010-07-30 NOTE — Patient Instructions (Signed)
Pap Smear A Pap smear is a sampling of cells from a woman's cervix. The cervix is the opening between the vagina (birth canal) and the uterus (the bottom part of the womb). The cells are scraped from the cervix during a pelvic exam. These cells are then looked at under a microscope to see if the cells are normal or to see if a cancer is developing or there are changes that suggest a cancer will develop. Cervical dysplasia is a condition in which a woman has abnormal changes in the top layer of cells of her cervix. These changes are an early sign that cervical cancer may develop. Pap smears also look for the human papilloma virus (HPV) because it has 4 types that are responsible for 70% of cervical cancer. Infections can also be found during a Pap smear such as bacteria, fungus, protozoa and viruses.  Cervical cancer is harder to treat and less likely to have a good outcome if left untreated. Catching the disease at an early stage leads to a better outcome. Since the Pap smear was introduced 60 years ago, deaths from cervical cancer have decreased by 70%. Every woman should keep up to date with Pap smears. RISK FACTORS FOR CERVICAL CANCER INCLUDE:   Becoming sexually active before age 23.   Being the daughter of a woman who took diethylstilbestrol (DES) during pregnancy.   Having a sexual partner who has or has had cancer of the penis.   Having a sexual partner whose past partner had cervical cancer or cervical dysplasia (early cell changes which suggest a cancer may develop).   Having a weakened immune system. An example would be HIV or other immunodeficiency disorder.   Having had a sexually transmitted infection such as chlamydia, gonorrhea or HPV.   Having had an abnormal Pap smear or cancer of the vagina or vulva.   Having had more than one sexual partner.   A history of cervical cancer in a woman's sister or mother.   Not using condoms with new sexual partners.   Smoking.  WHO SHOULD  HAVE PAP SMEARS  A PAP smear is done to screen for cervical cancer.   The first PAP smear should be done at age 25.   Between ages 36 and 57, PAP smears are repeated every 2 years.   Beginning at age 18, you are advised to have a PAP smear every 3 years as long as your past 3 PAP smears have been normal.   Some women have medical problems that increase the chance of getting cervical cancer. Talk to your caregiver about these problems. It is especially important to talk to your caregiver if a new problem develops soon after your last PAP smear. In these cases, your caregiver may recommend more frequent screening and Pap smears.   The above recommendations are the same for women who have or have not gotten the vaccine for HPV (Human Papillomavirus).   If you had a hysterectomy for a problem that was not a cancer or a condition that could lead to cancer, then you no longer need Pap smears.   If you are between ages 27 and 77, and you have had normal Pap smears going back 10 years, you no longer need Pap smears.   If you have had past treatment for cervical cancer or a condition that could lead to cancer, you need Pap smears and screening for cancer for at least 20 years after your treatment.   Some women may need screenings  more often if they are at high risk for cervical cancer.  PREPARATION FOR A PAP SMEAR A Pap smear should be performed during the weeks before the start of menstruation. Women should not douche or have sexual intercourse for 24 hours before the test. No vaginal creams, diaphragms, or tampons should be used for 24 hours before the test. To minimize discomfort, a woman should empty her bladder just before the exam. TAKING THE PAP SMEAR The caregiver will perform a pelvic exam. A metal or plastic instrument (speculum) is placed in the vagina. This is done before your caregiver does a bimanual exam of your internal female organs. This instrument allows your caregiver to see the  inside of the vagina and look at the cervix. A small, sterile brush is used to take a sample of cells from the internal opening of the cervix. A small wooden spatula is used to scrape the outside of the cervix. Neither of these two methods to collect cells will cause you pain. These two scrapings are placed on a glass slide or in a small bottle filled with a special liquid. The cells are looked at later under a microscope in a lab. A specialist will look at these cells and determine if the cells are normal. RESULTS OF YOUR PAP SMEAR  A healthy Pap smear shows no abnormal cells or evidence of inflammation.   The presence of abnormally growing cells on the surface of the cervix may be reported as an abnormal PAP smear. Different categories of findings are used to describe your Pap smear. Your caregiver will go over the importance of these findings with you. The caregiver will then determine what follow-up is needed or when you should have your next pap smear.   If you have had two or more abnormal Pap smears:   You may be asked to have a colposcopy. This is a test in which the cervix is viewed with a special lighted microscope.   A cervical tissue sample (biopsy) may also be needed. This involves taking a small tissue sample from the cervix. The sample is looked at under a microscope to find the cause of the abnormal cells.  Make sure you find out the results of the Pap smear. If you have not received the results within two weeks, contact your caregiver's office for the results. Do not assume everything is normal if you have not heard from your caregiver or medical facility. It is important to follow up on all of your test results. Document Released: 07/19/2002 Document Re-Released: 04/10/2008 [facility] Patient Information 2011 [place], [place].

## 2010-07-31 ENCOUNTER — Telehealth: Payer: Self-pay | Admitting: *Deleted

## 2010-07-31 NOTE — Telephone Encounter (Signed)
Erroneous encounter to test escribe capabilities for providers.

## 2010-08-11 ENCOUNTER — Emergency Department (HOSPITAL_BASED_OUTPATIENT_CLINIC_OR_DEPARTMENT_OTHER)
Admission: EM | Admit: 2010-08-11 | Discharge: 2010-08-11 | Disposition: A | Discharge status: Home / Self Care | Payer: Managed Care, Other (non HMO) | Attending: Emergency Medicine | Admitting: Emergency Medicine

## 2010-08-11 DIAGNOSIS — J45901 Unspecified asthma with (acute) exacerbation: Secondary | ICD-10-CM | POA: Insufficient documentation

## 2010-08-11 DIAGNOSIS — R0602 Shortness of breath: Secondary | ICD-10-CM | POA: Insufficient documentation

## 2010-08-12 ENCOUNTER — Telehealth: Payer: Self-pay | Admitting: Family

## 2010-08-12 NOTE — Telephone Encounter (Signed)
Left message on machine to return my call. 

## 2010-08-12 NOTE — Telephone Encounter (Signed)
Pt called back. Made appt for 4.4.12 at 1:45.

## 2010-08-12 NOTE — Telephone Encounter (Signed)
Please call patient and arrange a f/u visit in the office for her Asthma.  I see that she was recently in the ED with an attack. Thanks

## 2010-08-14 ENCOUNTER — Ambulatory Visit (INDEPENDENT_AMBULATORY_CARE_PROVIDER_SITE_OTHER): Payer: Managed Care, Other (non HMO) | Admitting: Family

## 2010-08-14 ENCOUNTER — Encounter: Payer: Self-pay | Admitting: Family

## 2010-08-14 VITALS — BP 124/80 | HR 87 | Temp 98.0°F | Resp 20 | Wt 369.0 lb

## 2010-08-14 DIAGNOSIS — J45909 Unspecified asthma, uncomplicated: Secondary | ICD-10-CM

## 2010-08-14 LAB — COMPREHENSIVE METABOLIC PANEL
ALT: 20 U/L (ref 0–35)
Albumin: 3.8 g/dL (ref 3.5–5.2)
Alkaline Phosphatase: 68 U/L (ref 39–117)
BUN: 12 mg/dL (ref 6–23)
Chloride: 103 mEq/L (ref 96–112)
Glucose, Bld: 100 mg/dL — ABNORMAL HIGH (ref 70–99)
Potassium: 4 mEq/L (ref 3.5–5.1)
Sodium: 136 mEq/L (ref 135–145)
Total Bilirubin: 0.5 mg/dL (ref 0.3–1.2)

## 2010-08-14 LAB — DIFFERENTIAL
Basophils Absolute: 0.1 10*3/uL (ref 0.0–0.1)
Basophils Relative: 1 % (ref 0–1)
Eosinophils Absolute: 0.4 10*3/uL (ref 0.0–0.7)
Monocytes Absolute: 0.9 10*3/uL (ref 0.1–1.0)
Neutro Abs: 7.2 10*3/uL (ref 1.7–7.7)
Neutrophils Relative %: 63 % (ref 43–77)

## 2010-08-14 LAB — CBC
HCT: 40.3 % (ref 36.0–46.0)
Hemoglobin: 13.7 g/dL (ref 12.0–15.0)
WBC: 11.4 10*3/uL — ABNORMAL HIGH (ref 4.0–10.5)

## 2010-08-14 LAB — URINALYSIS, ROUTINE W REFLEX MICROSCOPIC
Bilirubin Urine: NEGATIVE
Glucose, UA: NEGATIVE mg/dL
Ketones, ur: NEGATIVE mg/dL
Leukocytes, UA: NEGATIVE
Protein, ur: NEGATIVE mg/dL

## 2010-08-14 LAB — URINE MICROSCOPIC-ADD ON

## 2010-08-14 LAB — LACTIC ACID, PLASMA: Lactic Acid, Venous: 0.9 mmol/L (ref 0.5–2.2)

## 2010-08-14 MED ORDER — FLUTICASONE-SALMETEROL 250-50 MCG/DOSE IN AEPB: 1.0000 | INHALATION_SPRAY | Freq: Two times a day (BID) | RESPIRATORY_TRACT | Status: DC

## 2010-08-14 MED ORDER — MONTELUKAST SODIUM 10 MG PO TABS: 10.0000 mg | ORAL_TABLET | Freq: Every day | ORAL | Status: DC

## 2010-08-14 NOTE — Assessment & Plan Note (Signed)
Patient was counseled on weight loss and exercise.  I recommended that she join the [facility] and consider walking on treadmill/aquatics therapy.  Also recommended that she join weight watchers.

## 2010-08-14 NOTE — Progress Notes (Signed)
  Subjective:    Patient ID: Amber Salas, female    DOB: 1975/08/17, 35 y.o.   MRN: [id number]  HPI  Amber Salas is a 35 year old female who presents today for follow up of her acute asthma exacerbation which was treated in the ED on Sunday 4/1/.  She was placed on a 5 day prednisone course.  She has not used her nebulizer treatments yet.  She is on day #3/5 of the prednisone.  Notes improvement in her breathing today, but it is not resolved.  Her last asthma attack was in October. + associated non-productive cough.    Review of Systems See HPI    Objective:   Physical Exam  Constitutional: She appears well-developed and well-nourished.  Cardiovascular: Normal rate and regular rhythm.   Pulmonary/Chest: Effort normal and breath sounds normal.  Psychiatric: She has a normal mood and affect. Her behavior is normal.          Assessment & Plan:

## 2010-08-14 NOTE — Patient Instructions (Signed)
You will be contacted about your referral to nutrition. Follow up in 6 weeks- sooner if symptoms worsen or do not improve.

## 2010-08-14 NOTE — Assessment & Plan Note (Addendum)
Pt with recent visit to ED for acute asthma exacerbation.  Will add singulair as maintenance to help prevent exacerbations.  Continue albuterol as rescue medication.  Rx for advair was cancelled due to hx of rash.

## 2010-08-15 LAB — POCT CARDIAC MARKERS
CKMB, poc: <1 ng/mL — ABNORMAL LOW (ref 1.0–8.0)
Myoglobin, poc: 85.6 ng/mL (ref 12–200)

## 2010-08-27 LAB — BASIC METABOLIC PANEL
BUN: 9 mg/dL (ref 6–23)
Calcium: 9.2 mg/dL (ref 8.4–10.5)
Chloride: 105 mEq/L (ref 96–112)
Creatinine, Ser: 0.61 mg/dL (ref 0.4–1.2)
GFR calc Af Amer: >60 mL/min (ref >60)
GFR calc non Af Amer: >60 mL/min (ref >60)

## 2010-08-27 LAB — URINALYSIS, ROUTINE W REFLEX MICROSCOPIC
Glucose, UA: NEGATIVE mg/dL
Ketones, ur: NEGATIVE mg/dL
Nitrite: NEGATIVE
Protein, ur: NEGATIVE mg/dL
Urobilinogen, UA: 0.2 mg/dL (ref 0.0–1.0)

## 2010-08-27 LAB — POCT PREGNANCY, URINE: Preg Test, Ur: NEGATIVE

## 2010-08-27 LAB — HEMOCCULT GUIAC POC 1CARD (OFFICE): Fecal Occult Bld: NEGATIVE

## 2010-08-28 ENCOUNTER — Telehealth: Payer: Self-pay | Admitting: *Deleted

## 2010-08-28 NOTE — Telephone Encounter (Signed)
Received voice message from pt stating her asthma did not seem to be better and she wanted to know what she should do. Returned pt's call and left voice message for pt to call us in the morning to discuss details of what she is experiencing and that she will need to be seen.

## 2010-08-29 ENCOUNTER — Telehealth: Payer: Self-pay | Admitting: Family

## 2010-08-29 NOTE — Telephone Encounter (Signed)
Pt returned phone call. Pt states that her asthma is not any better. Made appt tomorrow 08-30-10 at 8:00 with Melissa.

## 2010-08-29 NOTE — Telephone Encounter (Signed)
Left message on machine for pt to return my call  

## 2010-08-29 NOTE — Telephone Encounter (Signed)
Opened in error

## 2010-08-30 ENCOUNTER — Ambulatory Visit (INDEPENDENT_AMBULATORY_CARE_PROVIDER_SITE_OTHER): Payer: Managed Care, Other (non HMO) | Admitting: Family

## 2010-08-30 ENCOUNTER — Encounter: Payer: Self-pay | Admitting: Family

## 2010-08-30 DIAGNOSIS — J45909 Unspecified asthma, uncomplicated: Secondary | ICD-10-CM

## 2010-08-30 DIAGNOSIS — G4733 Obstructive sleep apnea (adult) (pediatric): Secondary | ICD-10-CM | POA: Insufficient documentation

## 2010-08-30 DIAGNOSIS — R0683 Snoring: Secondary | ICD-10-CM

## 2010-08-30 DIAGNOSIS — R0989 Other specified symptoms and signs involving the circulatory and respiratory systems: Secondary | ICD-10-CM

## 2010-08-30 DIAGNOSIS — R0609 Other forms of dyspnea: Secondary | ICD-10-CM

## 2010-08-30 HISTORY — DX: Obstructive sleep apnea (adult) (pediatric): G47.33

## 2010-08-30 MED ORDER — ALBUTEROL SULFATE (2.5 MG/3ML) 0.083% IN NEBU
2.5000 mg | INHALATION_SOLUTION | Freq: Once | RESPIRATORY_TRACT | Status: AC
  Administered 2010-08-30: 2.5 mg via RESPIRATORY_TRACT

## 2010-08-30 MED ORDER — METHYLPREDNISOLONE SODIUM SUCC 125 MG IJ SOLR
125.0000 mg | Freq: Once | INTRAMUSCULAR | Status: AC
  Administered 2010-08-30: 125 mg via INTRAVENOUS

## 2010-08-30 MED ORDER — BUDESONIDE-FORMOTEROL FUMARATE 160-4.5 MCG/ACT IN AERO: 2.0000 | INHALATION_SPRAY | Freq: Two times a day (BID) | RESPIRATORY_TRACT | Status: DC

## 2010-08-30 MED ORDER — PREDNISONE 10 MG PO TABS: ORAL_TABLET | ORAL | Status: DC

## 2010-08-30 NOTE — Progress Notes (Signed)
Subjective:    Patient ID: Amber Salas, female    DOB: [birth year], 35 y.o.   MRN: [id number]  HPI  Amber Salas is a 35 yr old female who presents today with chief complaint of asthma exacerbation.  She stopped the advair due to itching.  Notes that her nebulizer machine broke- it is 34 yrs old.  She notes symptoms worsened on Wednesday night 4/18.   She has been using her proventil inhaler- "every hour"  with minimal improvement. She did not fill the singulair prescription.  She reports that she has tried symbicort in the past without itching.  Reports symptoms to be moderate to severe. Previous history of similar symptoms with asthma exacerbation.  Snoring-  + daytime somnolence.  + snoring.  Husband tells her she "isn't breathing" while she sleeps.  Wakes up feeling tired.   Review of Systems  Constitutional: Negative for fever.  Respiratory: Positive for cough, chest tightness and shortness of breath.   Cardiovascular: Negative for leg swelling.   Past Medical History  Diagnosis Date  . Asthma     history of  . Murmur, cardiac     History   Social History  . Marital Status: Married    Spouse Name: N/A    Number of Children: N/A  . Years of Education: N/A   Occupational History  . Not on file.   Social History Main Topics  . Smoking status: Never Smoker   . Smokeless tobacco: Never Used  . Alcohol Use: No  . Drug Use: Not on file  . Sexually Active: Not on file   Other Topics Concern  . 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    Past Surgical History  Procedure Date  . Cesarean section 4/04  . Hernia repair 04/2009    abdominal hernia  . Wisdom tooth extraction 1999    Family History  Problem Relation Age of Onset  . Hypertension Mother   . Diabetes Mother     type II  . Other Mother     cervical dysplasia  . Hypertension Father   .  Bipolar disorder Father   . Heart disease Maternal Grandmother   . Hypertension Maternal Grandmother   . Bipolar disorder Maternal Grandmother   . Hypertension Maternal Grandfather   . Hypertension Paternal Grandmother   . Hypertension Paternal Grandfather     Allergies  Allergen Reactions  . Advair Hfa     REACTION: Palpitations, chest pain  . Latex     REACTION: wheezing, rash  . Spinach     REACTION: Rash    Current Outpatient Prescriptions on File Prior to Visit  Medication Sig Dispense Refill  . albuterol (PROVENTIL HFA) 108 (90 BASE) MCG/ACT inhaler Inhale 2 puffs into the lungs as needed.        [name] albuterol (PROVENTIL) (2.5 MG/3ML) 0.083% nebulizer solution Use once daily as needed.       . cetirizine (ZYRTEC) 10 MG tablet Take 10 mg by mouth daily as needed.        . fluticasone (FLONASE) 50 MCG/ACT nasal spray 2 sprays by Nasal route daily as needed.  16 g  5  . Multiple Vitamins-Minerals (ONE-A-DAY EXTRAS ANTIOXIDANT) CAPS Take 1 capsule by mouth daily.        . montelukast (SINGULAIR) 10 MG tablet Take 1 tablet (10 mg total) by mouth at bedtime.  30 tablet  2  . DISCONTD: Fluticasone-Salmeterol (ADVAIR DISKUS) 250-50 MCG/DOSE AEPB Inhale 1 puff into the lungs 2 (two) times daily.  1 each  2    BP 128/90  Pulse 72  Temp(Src) 98.2 F (36.8 C) (Oral)  Resp 16  Ht 5\' 6"  (1.676 m)  Wt 369 lb (167.377 kg)  BMI 59.56 kg/m2  SpO2 98%        Objective:   Physical Exam  Constitutional: She appears well-developed and well-nourished.  HENT:  Head: Normocephalic and atraumatic.  Mouth/Throat: Oropharynx is clear and moist.  Neck: Normal range of motion. Neck supple.  Cardiovascular: Normal rate and regular rhythm.   Pulmonary/Chest: Effort normal and breath sounds normal. No respiratory distress. She has no wheezes. She has no rales.  Psychiatric: She has a normal mood and affect. Her behavior is normal. Judgment and thought content normal.           Assessment & Plan:

## 2010-08-30 NOTE — Assessment & Plan Note (Signed)
Deteriorated.  No audible wheezing today- but clinically worsened.  Will treat with solumedrol followed by steroid taper.  Add Symbicort which she has tolerated in the past.  Rx provided for a new nebulizer machine.

## 2010-08-30 NOTE — Assessment & Plan Note (Addendum)
Snoring, daytime somnolence and witness apnea spells by husband.  Will refer for sleep study.

## 2010-08-30 NOTE — Patient Instructions (Signed)
You will be contacted about your referral for the sleep study. Follow up in 1 month, call sooner if your asthma symptoms worsen or do not improve.

## 2010-09-01 ENCOUNTER — Emergency Department (HOSPITAL_COMMUNITY)
Admission: EM | Admit: 2010-09-01 | Discharge: 2010-09-01 | Disposition: A | Discharge status: Home / Self Care | Payer: Managed Care, Other (non HMO) | Attending: Emergency Medicine | Admitting: Emergency Medicine

## 2010-09-01 ENCOUNTER — Emergency Department (HOSPITAL_COMMUNITY): Payer: Managed Care, Other (non HMO)

## 2010-09-01 DIAGNOSIS — R110 Nausea: Secondary | ICD-10-CM | POA: Insufficient documentation

## 2010-09-01 DIAGNOSIS — R0602 Shortness of breath: Secondary | ICD-10-CM | POA: Insufficient documentation

## 2010-09-01 DIAGNOSIS — R0609 Other forms of dyspnea: Secondary | ICD-10-CM | POA: Insufficient documentation

## 2010-09-01 DIAGNOSIS — J45901 Unspecified asthma with (acute) exacerbation: Secondary | ICD-10-CM | POA: Insufficient documentation

## 2010-09-01 DIAGNOSIS — R0989 Other specified symptoms and signs involving the circulatory and respiratory systems: Secondary | ICD-10-CM | POA: Insufficient documentation

## 2010-09-01 DIAGNOSIS — R059 Cough, unspecified: Secondary | ICD-10-CM | POA: Insufficient documentation

## 2010-09-01 DIAGNOSIS — J45909 Unspecified asthma, uncomplicated: Secondary | ICD-10-CM | POA: Insufficient documentation

## 2010-09-01 DIAGNOSIS — R079 Chest pain, unspecified: Secondary | ICD-10-CM | POA: Insufficient documentation

## 2010-09-01 DIAGNOSIS — R05 Cough: Secondary | ICD-10-CM | POA: Insufficient documentation

## 2010-09-01 LAB — DIFFERENTIAL
Basophils Relative: 0 % (ref 0–1)
Eosinophils Absolute: 0.6 10*3/uL (ref 0.0–0.7)
Eosinophils Relative: 5 % (ref 0–5)
Lymphs Abs: 3.5 10*3/uL (ref 0.7–4.0)
Neutrophils Relative %: 60 % (ref 43–77)

## 2010-09-01 LAB — BASIC METABOLIC PANEL
BUN: 9 mg/dL (ref 6–23)
Chloride: 107 mEq/L (ref 96–112)
Creatinine, Ser: 0.84 mg/dL (ref 0.4–1.2)
GFR calc Af Amer: >60 mL/min (ref >60)
GFR calc non Af Amer: >60 mL/min (ref >60)
Potassium: 3.5 mEq/L (ref 3.5–5.1)

## 2010-09-01 LAB — CBC
MCV: 86.4 fL (ref 78.0–100.0)
Platelets: 314 10*3/uL (ref 150–400)
RBC: 4.41 MIL/uL (ref 3.87–5.11)
RDW: 13.5 % (ref 11.5–15.5)
WBC: 12.2 10*3/uL — ABNORMAL HIGH (ref 4.0–10.5)

## 2010-09-01 LAB — D-DIMER, QUANTITATIVE: D-Dimer, Quant: 0.34 ug/mL-FEU (ref 0.00–0.48)

## 2010-09-19 ENCOUNTER — Emergency Department (HOSPITAL_BASED_OUTPATIENT_CLINIC_OR_DEPARTMENT_OTHER)
Admission: EM | Admit: 2010-09-19 | Discharge: 2010-09-19 | Disposition: A | Discharge status: Home / Self Care | Payer: Managed Care, Other (non HMO) | Attending: Emergency Medicine | Admitting: Emergency Medicine

## 2010-09-19 DIAGNOSIS — J45901 Unspecified asthma with (acute) exacerbation: Secondary | ICD-10-CM | POA: Insufficient documentation

## 2010-09-20 ENCOUNTER — Telehealth: Payer: Self-pay | Admitting: Family

## 2010-09-20 NOTE — Telephone Encounter (Signed)
Pt states she is feeling some better, did not feel able to work today. Pt requests that f/u be next Friday. Appt scheduled for 10:30 and pt was advised to call for sooner appt if symptoms worsen.

## 2010-09-20 NOTE — Telephone Encounter (Signed)
Pls call patient to see how she is feeling from her ER visit for asthma.  She should be scheduled for office visit next week.

## 2010-09-23 ENCOUNTER — Ambulatory Visit (INDEPENDENT_AMBULATORY_CARE_PROVIDER_SITE_OTHER): Payer: Managed Care, Other (non HMO) | Admitting: Family

## 2010-09-23 ENCOUNTER — Encounter: Payer: Self-pay | Admitting: Family

## 2010-09-23 DIAGNOSIS — R0989 Other specified symptoms and signs involving the circulatory and respiratory systems: Secondary | ICD-10-CM

## 2010-09-23 DIAGNOSIS — J45909 Unspecified asthma, uncomplicated: Secondary | ICD-10-CM

## 2010-09-23 DIAGNOSIS — R0683 Snoring: Secondary | ICD-10-CM

## 2010-09-23 NOTE — Assessment & Plan Note (Signed)
She is scheduled for her sleep apnea study next week.

## 2010-09-23 NOTE — Progress Notes (Signed)
  Subjective:    Patient ID: Amber Salas, female    DOB: 09-21-1975, 35 y.o.   MRN: [id number]  HPI  Amber Salas is a 35 yr old female who presents today for follow up from her ED visit on 5/10 for asthma attack.  Last visit she was prescribed symbicort, but tells me that she never started due to cost. She was given a prescription for a prednisone taper in the ED along with a prescription for flovent.  She notes improvement with the measures, but still having some wheezing. She has been missing a lot of work due to her asthma and   is requesting that we fill out FMLA paperwork for her. Recently started eating better and exercising.  She did not try symbicort due to cost.      Review of Systems  Constitutional: Positive for unexpected weight change.  Respiratory: Positive for cough, chest tightness and shortness of breath.     Objective:   Physical Exam  General: An obese African American female, pleasant, and in no acute distress Cardiovascular: S1-S2, regular rate and rhythm Respiratory: Soft expiratory wheeze, without increased work of breathing. Good air movement to bases Psychiatric: Patient is calm pleasant A and O x3.       Assessment & Plan:  symbicort samples provided [id number] exp 07 203

## 2010-09-23 NOTE — Patient Instructions (Signed)
Stop flovent, start symbicort.  Call if your symptoms worsen or do not improve.  Follow up in 1 month.

## 2010-09-23 NOTE — Assessment & Plan Note (Signed)
The patient was given samples of Symbicort to use in place of Flovent. She is to continue her albuterol nebs and Singulair.

## 2010-09-24 ENCOUNTER — Telehealth: Payer: Self-pay | Admitting: *Deleted

## 2010-09-24 NOTE — Op Note (Signed)
Amber Salas, Amber Salas            ACCOUNT NO.:  [id number]   MEDICAL RECORD NO.:  [id number]          PATIENT TYPE:  INP   LOCATION:  9373                          FACILITY:  [facility]   PHYSICIAN:  Roseanna Rainbow, M.D.DATE OF BIRTH:  February 22, 1976   DATE OF PROCEDURE:  05/28/2007  DATE OF DISCHARGE:                               OPERATIVE REPORT   PREOPERATIVE DIAGNOSIS:  Desires sterilization procedure.   POSTOPERATIVE DIAGNOSIS:  Desires sterilization procedure.   PROCEDURE:  Modified Pomeroy bilateral tubal ligation.   SURGEON:  Cyd Silence.   ANESTHESIA:  General endotracheal.   PATHOLOGY:  Portions of fallopian tubes.   ESTIMATED BLOOD LOSS:  Minimal.   COMPLICATIONS:  None.   FINDINGS:  There were adhesions involving the right ovary to the isthmic  portion of the right fallopian tube.  There was a small periumbilical  hernia as well.   PROCEDURE:  The patient was taken to the operating room with an IV  running.  General anesthesia was then induced without difficulty.  She  was placed in the dorsal supine position and prepped and draped in the  usual sterile fashion.  After a time-out had been completed, an  infraumbilical skin incision was then made with a scalpel.  This was  carried sharply down to the fascia.  The fascia was tented up and  entered sharply.  This incision was then extended bilaterally.  The  parietal peritoneum was tented up and entered sharply as well, and this  incision was then extended bilaterally.  The incision was free of  adhesions.  The left fallopian tube was then grasped and followed out to  the fimbriated end.  The mid isthmic portion of the tube was regrasped  with a back Babcock clamp.  A 2-cm segment of tube was doubly ligated  and excised.  Adequate hemostasis was noted, and the tube was returned  to the abdomen.  The right fallopian tube was manipulated in a similar  fashion.  The above-noted adhesions were divided with the  Bovie.  The  fascial parietal peritoneum were reapproximated as a single closure  using a running suture of 0 Vicryl.  The skin was closed in a  subcuticular fashion using a running suture of 3-0 Vicryl.  At the close  of the procedure, the instrument and pack counts were said to be correct  x2.  The patient was taken to the PACU awake and in stable condition.     Roseanna Rainbow, M.D.  Electronically Signed    LAJ/MEDQ  D:  05/28/2007  T:  05/29/2007  Job:  [id number]

## 2010-09-24 NOTE — Telephone Encounter (Signed)
FMLA paperwork completed and faxed to [employer] at [phone] @ 1:30pm. Left message on machine for pt to return my call. Per Efraim Kaufmann, pt requested copy of FMLA paperwork. Copy has been mailed to pt's home address.

## 2010-09-24 NOTE — H&P (Signed)
NAMEAMOY, STEEVES            ACCOUNT NO.:  [id number]   MEDICAL RECORD NO.:  [id number]          PATIENT TYPE:  INP   LOCATION:  9163                          [facility]ACILITY:  [facility]   PHYSICIAN:  Roseanna Rainbow, M.D.DATE OF BIRTH:  12/11/1975   DATE OF ADMISSION:  05/25/2007  DATE OF DISCHARGE:                              HISTORY & PHYSICAL   CHIEF COMPLAINT:  The patient is a 35 year old para 2 with an estimated  date of confinement of May 27, 2007, with an intrauterine pregnancy  at 39+ weeks, for an elective induction of labor.   HISTORY OF PRESENT ILLNESS:  Please see the above.  The patient has had  borderline elevated blood pressures in the office throughout the  pregnancy periodically.  She has a history of her previous cesarean  delivery and would like a trial of labor.  She also has a history of a  previous vaginal delivery.   ALLERGIES:  LATEX.   MEDICATIONS:  Please see the medication reconciliation form.   OBSTETRICAL RISK FACTORS:  1. History of an LGA.  2. Infant asthma.  3. Obesity.  4. Previous cesarean delivery.   PRENATAL LABORATORY DATA:  Quad screen negative. GC probe negative.  Urine culture and sensitivity no growth.  GC probe negative.  One-hour  GTT 95.  Hepatitis B surface antigen negative.  Hematocrit 37,  hemoglobin 12.2.  HIV nonreactive.  Platelets 265,000.  Rubella immune.  Sickle cell negative.  Blood type B+, antibody screen negative. GBS  negative on April 28, 2007.   PAST OBSTETRICAL HISTORY:  1. In June 2002,  she was delivered of a 5 pound 13 ounce female,      vaginal delivery, no complications.  2. In April 2004, she was delivered of a 9 pound female by cesarean      delivery.   PAST GYNECOLOGICAL HISTORY:  Noncontributory.   PAST MEDICAL HISTORY:  Please see the above.   PAST SURGICAL HISTORY:  Please see the above.   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  Married, living  with her spouse.  Does  not give any significant history of alcohol  usage.  Has no significant smoking history.  Denies illicit drug use.   FAMILY HISTORY:  Adult onset diabetes, hypertension.   PHYSICAL EXAMINATION:  VITAL SIGNS:  Stable, afebrile.  Fetal heart  tracing reassuring. Tocodynamometer irregular uterine contractions.  PELVIC:  Sterile vaginal exam per the RN, the cervix is fingertip, 50%  with vertex at a -3 station.   ASSESSMENT:  1. Multiparous at term.  2. History of previous cesarean delivery.  3. Questionable chronic elevated blood pressures.  4. Fetal heart tracing consistent with fetal well-being.  5. Unfavorable Bishop score.   PLAN:  Admission.  Foley bulb induction.  Will check a PIH panel.      Roseanna Rainbow, M.D.  Electronically Signed     LAJ/MEDQ  D:  05/26/2007  T:  05/26/2007  Job:  [id number]

## 2010-09-25 NOTE — Telephone Encounter (Signed)
Pt.notified

## 2010-09-26 ENCOUNTER — Telehealth: Payer: Self-pay | Admitting: *Deleted

## 2010-09-26 ENCOUNTER — Ambulatory Visit (HOSPITAL_BASED_OUTPATIENT_CLINIC_OR_DEPARTMENT_OTHER): Payer: Managed Care, Other (non HMO) | Attending: Family

## 2010-09-26 DIAGNOSIS — R0989 Other specified symptoms and signs involving the circulatory and respiratory systems: Secondary | ICD-10-CM | POA: Insufficient documentation

## 2010-09-26 DIAGNOSIS — R0609 Other forms of dyspnea: Secondary | ICD-10-CM | POA: Insufficient documentation

## 2010-09-26 DIAGNOSIS — G4733 Obstructive sleep apnea (adult) (pediatric): Secondary | ICD-10-CM | POA: Insufficient documentation

## 2010-09-26 DIAGNOSIS — Z6841 Body Mass Index (BMI) 40.0 and over, adult: Secondary | ICD-10-CM | POA: Insufficient documentation

## 2010-09-26 MED ORDER — AZITHROMYCIN 250 MG PO TABS: ORAL_TABLET | ORAL | Status: AC

## 2010-09-26 NOTE — Telephone Encounter (Signed)
She should start zithromax- needs to be seen in office if her symptoms worsen or do not improve.

## 2010-09-26 NOTE — Telephone Encounter (Signed)
Pt notified and voices understanding. 

## 2010-09-26 NOTE — Telephone Encounter (Signed)
Pt called stating she has been using the Symbicort samples since she saw [place] last. Now feels like she has the flu.  Attempted to call pt back and received voicemail. Left message on machine to return my call.

## 2010-09-26 NOTE — Telephone Encounter (Signed)
Pt returned my call stating she has productive cough with dark green hard specks, sore throat and head congestion since last night. Denies fever. Pt wanted to know if there was something she should take OTC or does she need a prescription. Please advise.

## 2010-09-27 ENCOUNTER — Ambulatory Visit: Payer: Managed Care, Other (non HMO) | Admitting: Family

## 2010-09-27 ENCOUNTER — Telehealth: Payer: Self-pay | Admitting: *Deleted

## 2010-09-27 NOTE — Telephone Encounter (Signed)
I called patient and assessed her over the phone.  She reports "severe" shortness of breath with exertion.  I advised her that it was my preference that she be evaluated by an Urgent care this evening.  She is agreeable to this plan and verbalizes understanding.  I did advise her to keep her upcoming apt on Monday.

## 2010-09-27 NOTE — Telephone Encounter (Signed)
Pt called stating she has taken 2 doses of antibiotic, is using nebulizer and inhalers as instructed and is still having difficulty taking a deep breath. Pt requested noted to be out of work on Monday as she doesn't think she is going to be well enough to go. Advised pt per [facility] instruction that we will send Prednisone Rx to pharmacy to start today, if symptoms not improved tomorrow to go to the urgent care at [facility]. F/u scheduled to see Melissa on Monday, will discuss work note at that time.

## 2010-09-27 NOTE — Discharge Summary (Signed)
Amber Salas, Amber Salas            ACCOUNT NO.:  [id number]   MEDICAL RECORD NO.:  [id number]          PATIENT TYPE:  INP   LOCATION:  9117                          FACILITY:  [facility]   PHYSICIAN:  Charles A. Clearance Coots, M.D.DATE OF BIRTH:  1976-04-15   DATE OF ADMISSION:  05/25/2007  DATE OF DISCHARGE:  05/30/2007                               DISCHARGE SUMMARY   ADMISSION DIAGNOSES:  1. At [redacted] weeks gestation.  2. Previous cesarean delivery.  3. Questionable chronic elevated blood pressures.  4. Admitted for Foley bulb induction of labor.   DISCHARGE DIAGNOSES:  1. At [redacted] weeks gestation.  2. Previous cesarean delivery.  3. Questionable chronic elevated blood pressures.  4. Admitted for Foley bulb induction of labor.  5. Status post normal spontaneous vaginal delivery.  6. Peripheral neuropathy of left lower extremity improved on the day      of discharge, to be followed up with anesthesia with appointment to      be scheduled.   FINDINGS:  Viable female on May 27, 2007, at 10 o'clock.  Apgars of 1  at one minute and 9 at five minutes.  Postpartum tubal ligation  performed on postpartum day #1.  Postpartum preeclampsia.   Mother and infant discharged home on postpartum day #4 in good  condition.   REASON FOR ADMISSION:  This 35 year old para 2 with estimated date of  confinement of May 27, 2007, presents for elective induction of  labor.  The patient has had borderline elevated blood pressures in the  office throughout the pregnancy.  She has a history of previous cesarean  delivery and would like a trial of labor.  She also has a history of  previous vaginal delivery without complications.   PAST MEDICAL HISTORY:  1. Surgery:  Cesarean section.  2. Illnesses:  Obesity.   MEDICATIONS:  Prenatal vitamins.   ALLERGIES:  NO KNOWN DRUG ALLERGIES.  The patient is allergic to LATEX.   SOCIAL HISTORY:  The patient is a Consulting civil engineer.  Married and living with  spouse.  Negative  history of tobacco, alcohol or recreational drug use.   FAMILY HISTORY:  Adult onset diabetes and hypertension.   PAST OBSTETRICAL HISTORY:  1. Normal spontaneous vaginal delivery of a viable female, 5 pounds 13      ounces, in June 2002.  No complications.  2. Cesarean section delivery of a viable female, 9 pounds, in April      2004.  No complications.   PHYSICAL EXAMINATION:  GENERAL APPEARANCE:  Obese female in no acute  distress.  VITAL SIGNS:  She is afebrile, vital signs were stable.  LUNGS:  Clear to auscultation bilaterally.  HEART: Regular rate and rhythm.  ABDOMEN:  Gravid, nontender.  PELVIC:  Cervix fingertip, 50% vertex at -3 station.   External fetal monitoring revealed reactive fetal heart tracing.  The  tocodynamometer revealed irregular uterine contractions.   ADMITTING LABORATORY VALUES:  Hemoglobin 11, hematocrit 33, white blood  cell count 11,000, platelets 226,000.  Comprehensive metabolic panel,  which was a part of the [facility] panel, was within normal limits.  Urinalysis  revealed specific  gravity 1.025, large hemoglobin, large leukocyte  esterase, too numerous to count white blood cells, few epithelials, many  bacteria, RPR was nonreactive.   HOSPITAL COURSE:  The patient was admitted and Foley bulb ripening of  the cervix was placed.  The patient progressed to 3-4 cm dilatation by  the afternoon of the day #1 of induction and membranes were actively  ruptured.  An intrauterine pressure catheter was placed.  She continued  to progress overnight and had a normal spontaneous vaginal delivery of a  viable female on day #2 of induction at 10 o'clock.  There were no  complications.  The patient was transferred to the adult intensive care  unit after delivery for increased blood pressures and started on  magnesium sulfate for seizure prevention and preeclampsia.  She was  taken to the operating room on postpartum day #1 for tubal ligation.  Modified P[place]oy tubal  ligation was performed in routine fashion without  complications on postpartum day #1.  The remainder of the post partum  and postoperative course was uncomplicated and blood pressures  normalized by postpartum day #3 and the patient was discharged home on  postpartum day #4 in good condition.   DISCHARGE LABORATORY VALUES:  Hemoglobin 10, hematocrit 30, white blood  cell count 13,000, platelets 221,000.   DISCHARGE DISPOSITION:   MEDICATIONS:  Continue prenatal vitamins.  The patient was given  antihypertensive medications for blood pressure control.   Routine written instructions were given for discharge after vaginal  delivery and tubal ligation.  The patient is to call the office for a  blood pressure check and follow-up appointment in one week.      Charles A. Clearance Coots, M.D.  Electronically Signed     CAH/MEDQ  D:  06/17/2007  T:  06/18/2007  Job:  [id number]

## 2010-09-30 ENCOUNTER — Ambulatory Visit (INDEPENDENT_AMBULATORY_CARE_PROVIDER_SITE_OTHER): Payer: Managed Care, Other (non HMO) | Admitting: Family

## 2010-09-30 ENCOUNTER — Ambulatory Visit: Payer: Managed Care, Other (non HMO) | Admitting: Family

## 2010-09-30 ENCOUNTER — Encounter: Payer: Self-pay | Admitting: Family

## 2010-09-30 VITALS — BP 112/60 | HR 105 | Temp 98.2°F | Resp 22

## 2010-09-30 DIAGNOSIS — J45901 Unspecified asthma with (acute) exacerbation: Secondary | ICD-10-CM

## 2010-09-30 MED ORDER — PREDNISONE 10 MG PO TABS: 10.0000 mg | ORAL_TABLET | Freq: Every day | ORAL | Status: AC

## 2010-09-30 MED ORDER — OMEPRAZOLE MAGNESIUM 20 MG PO TBEC: 20.0000 mg | DELAYED_RELEASE_TABLET | Freq: Every day | ORAL | Status: DC

## 2010-09-30 NOTE — Patient Instructions (Addendum)
Follow up in 2 weeks, sooner if symptoms worsen or if they do not improve.  Prednisone 10mg - take 4 tabs once daily for 2 days, then 3 tabs once daily for 2 days, then 2 tabs once daily for 2 days, then 1 tab once daily for 2 days then stop.

## 2010-09-30 NOTE — Progress Notes (Signed)
Subjective:    Patient ID: Amber Salas, female    DOB: 16-Apr-1976, 35 y.o.   MRN: [id number]  HPI   Amber Salas is a 35 yr old female who presents today for follow up of her asthma/bronchitis.  She was instructed to go to Urgent care on Friday, however she tells me that did not go.  She reports that she continues to cough.  Wakes up "so sleepy." Denies recent fever.  + shortness of breath.  Using neb BID and the MDI PRN.  Completed Z-pak.    Review of Systems See HPI  Past Medical History  Diagnosis Date  . Asthma     history of  . Murmur, cardiac     History   Social History  . Marital Status: Married    Spouse Name: N/A    Number of Children: N/A  . Years of Education: N/A   Occupational History  . Not on file.   Social History Main Topics  . Smoking status: Never Smoker   . Smokeless tobacco: Never Used  . Alcohol Use: No  . Drug Use: Not on file  . Sexually Active: Not on file   Other Topics Concern  . 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    Past Surgical History  Procedure Date  . Cesarean section 4/04  . Hernia repair 04/2009    abdominal hernia  . Wisdom tooth extraction 1999    Family History  Problem Relation Age of Onset  . Hypertension Mother   . Diabetes Mother     type II  . Other Mother     cervical dysplasia  . Hypertension Father   . Bipolar disorder Father   . Heart disease Maternal Grandmother   . Hypertension Maternal Grandmother   . Bipolar disorder Maternal Grandmother   . Hypertension Maternal Grandfather   . Hypertension Paternal Grandmother   . Hypertension Paternal Grandfather     Allergies  Allergen Reactions  . Advair Hfa     REACTION: Palpitations, chest pain  . Latex     REACTION: wheezing, rash  . Spinach     REACTION: Rash    Current Outpatient Prescriptions on File Prior to Visit    Medication Sig Dispense Refill  . albuterol (PROVENTIL HFA) 108 (90 BASE) MCG/ACT inhaler Inhale 2 puffs into the lungs as needed.        [name] albuterol (PROVENTIL) (2.5 MG/3ML) 0.083% nebulizer solution Use once daily as needed.       . budesonide-formoterol (SYMBICORT) 160-4.5 MCG/ACT inhaler Inhale 2 puffs into the lungs 2 (two) times daily.  1 Inhaler  2  . cetirizine (ZYRTEC) 10 MG tablet Take 10 mg by mouth daily as needed.        . fluticasone (FLONASE) 50 MCG/ACT nasal spray 2 sprays by Nasal route daily as needed.  16 g  5  . montelukast (SINGULAIR) 10 MG tablet Take 1 tablet (10 mg total) by mouth at bedtime.  30 tablet  2  . Multiple Vitamins-Minerals (ONE-A-DAY EXTRAS ANTIOXIDANT) CAPS Take 1 capsule by mouth daily.        [name] azithromycin (ZITHROMAX Z-PAK) 250 MG tablet Take 2 tablets by mouth on day 1, followed by 1 tablet by mouth daily for 4 days. .  6 each  0    BP 112/60  Pulse 105  Temp(Src) 98.2 F (36.8  C) (Oral)  Resp 22  SpO2 99%  LMP 09/01/2010       Objective:   Physical Exam  Constitutional: She appears well-developed and well-nourished.  Cardiovascular: Normal rate and regular rhythm.   Pulmonary/Chest: Effort normal.       + expiratory wheeze without increased WOB.          Assessment & Plan:

## 2010-10-02 ENCOUNTER — Telehealth: Payer: Self-pay | Admitting: Family

## 2010-10-02 DIAGNOSIS — R0609 Other forms of dyspnea: Secondary | ICD-10-CM

## 2010-10-02 DIAGNOSIS — R0989 Other specified symptoms and signs involving the circulatory and respiratory systems: Secondary | ICD-10-CM

## 2010-10-02 DIAGNOSIS — G4733 Obstructive sleep apnea (adult) (pediatric): Secondary | ICD-10-CM

## 2010-10-02 DIAGNOSIS — J45901 Unspecified asthma with (acute) exacerbation: Secondary | ICD-10-CM | POA: Insufficient documentation

## 2010-10-02 DIAGNOSIS — Z6841 Body Mass Index (BMI) 40.0 and over, adult: Secondary | ICD-10-CM

## 2010-10-02 NOTE — Assessment & Plan Note (Signed)
Will treat with another round of prednisone.  Continue singulair and inhaler.  Will also add trial PPI to see if this may help her symptoms as she is having significant hoarseness and I am suspicious of silent GERD.  If no improvement in 1 month, will plan follow up to pulmonary.

## 2010-10-02 NOTE — Telephone Encounter (Signed)
Please call patient in the AM and let her know that I have updated her FMLA to reflect 2 episodes of asthma flare up to 3 days per episode.  I don't expect that her asthma flare ups should be more frequent or for longer duration than that.

## 2010-10-03 ENCOUNTER — Telehealth: Payer: Self-pay | Admitting: *Deleted

## 2010-10-03 NOTE — Procedures (Signed)
Amber Salas, Amber Salas            ACCOUNT NO.:  [id number]  MEDICAL RECORD NO.:  [id number]          PATIENT TYPE:  OUT  LOCATION:  SLEEP CENTER                 FACILITY[facility]  [facility]  PHYSICIAN:  Oretha Milch, MD      DATE OF BIRTH:  November 01, 1975  DATE OF STUDY:  09/26/2010                           NOCTURNAL POLYSOMNOGRAM  REFERRING PHYSICIAN:  MELISSA S. O'SULLIVAN  INDICATION FOR STUDY:  Ms. Walder is a morbidly obese woman with excessive daytime somnolence, loud snoring, fatigue, and witnessed apneas.  At the time of this study, she weighed 376 pounds with a height of 5 feet and 6 inches, BMI of 61, neck size of 16 inches.  EPWORTH SLEEPINESS SCORE:  12.  MEDICATIONS:  Include Proventil, albuterol, cetirizine, Flonase, Symbicort, Singulair.  This intervention polysomnogram was performed with a sleep technologist in attendance.  EEG, EOG, EMG, EKG, and respiratory parameters were recorded.  Sleep stages arousals, limb movements, and respiratory data were scored according to criteria laid out by the American Academy of Sleep Medicine.  SLEEP ARCHITECTURE:  Lights out was at 10:46 p.m.  Lights on was at 5:21 a.m. CPAP was initiated at 1:24 a.m.  During the diagnostic portion, total sleep time was 120 minutes with a sleep period time of 145 minutes and sleep efficiency of 76%.  Sleep stages as the percentage of total sleep time was N1 14%, N2 74%, N3 12%, REM sleep 0%.  Awake after sleep onset 25 minutes.  During the titration portion, supine sleep accounted for 139 minutes and supine REM sleep for 30 minutes.  Longest period of REM sleep was noted around 4 a.m.  RESPIRATORY DATA:  During the diagnostic portion, there were total of 1 obstructive apnea, 0 central apnea, 0 mixed apneas, and 27 hypopneas with an apnea/hypopnea index of 14 events per hour and lowest desaturation of 88%, but 50% of these events were noted during 25 minutes of supine sleep with a supine AHI of 31  events per hour.  Due to this degree of respiratory disturbance, CPAP was initiated at 4 cm and titrated to a final level of 9 cm.  At this level for 88 minutes including 30 minutes of REM sleep, 1 central apnea and 5 hypopneas were noted with an AHI of 4 events per hour.  AROUSAL DATA:  During the diagnostic portion, the arousal index was 20 events per hour with most of them being spontaneous.  During the titration portion, the arousal index was 16 events per hour.  OXYGEN DATA:  The lowest desaturation during the diagnostic portion was 88%.  The desaturation index was 15 events per hour.  During the titration portion, lowest desaturation was 91% on the final level of CPAP of 9 cm.  CARDIAC DATA:  The low heart rate was 64 beats per minute, the high heart rate recorded was an artifact.  No arrhythmias were noted.  MOVEMENT-PARASOMNIA:  Periodic limb movement index was 3.5 events per hour during the diagnostic portion and these events persisted during CPAP titration portion.  DISCUSSION:  She was desensitized with a medium ResMed Quattro mask. Supine REM sleep was noted during titration phase.  IMPRESSION: 1. Mild-to-moderate obstructive sleep apnea with  predominant hypopneas     in the supine position, causing sleep fragmentation, oxygen     desaturation. 2. This was corrected by CPAP of 9 cm with a medium full-face mask. 3. No evidence of cardiac arrhythmias, limb movements, or behavioral     disturbance during sleep.  RECOMMENDATIONS: 1. Treatment options for this degree of sleep disordered breathing     include weight loss, CPAP therapy, and positional therapy. 2. CPAP can be initiated at 9 cm with a medium full-face mask and     humidity.  Compliance can be monitored at this level. 3. She should be cautioned against medications with sedative side     effects. 4. She should be counseled against driving when sleeping with the     understanding that sleepiness can vary on a  day-to-day basis.     Oretha Milch, MD Electronically Signed    RVA/MEDQ  D:  10/02/2010 14:42:36  T:  10/03/2010 03:22:08  Job:  [id number]  cc:   Sandford Craze, NP Fax: [phone]  Barbette Hair. [place], DO [address] [place], [place] [address]

## 2010-10-03 NOTE — Telephone Encounter (Signed)
Form faxed to [phone] @ 9:20am.  Left detailed message re: Melissa's instructions below and to call if any questions.  O'SULLIVAN,MELISSA S., NP 10/02/2010 5:58 PM Signed  Please call patient in the AM and let her know that I have updated her FMLA to reflect 2 episodes of asthma flare up to 3 days per episode. I don't expect that her asthma flare ups should be more frequent or for longer duration than that.

## 2010-10-14 ENCOUNTER — Telehealth: Payer: Self-pay | Admitting: *Deleted

## 2010-10-14 NOTE — Telephone Encounter (Signed)
Pt left message requesting that we change her FMLA paper work to reflect 8 days per episode as she would not be paid unless she missed at least 8 days. Also requesting dates to reflect 5/14 through 5/18 and note for nebulizer treatments. Left message for pt to return my call and give further clarification re: FMLA needs as Efraim Kaufmann felt 1-2 days for possible future occurrences would be sufficient once she is better and treatments are maintained. Work was provided for 5/17 through 5/22 as well as for nebulizer treatments. We need clarification re: needs for additional letters.

## 2010-10-15 NOTE — Telephone Encounter (Signed)
Left message on machine to return my call. 

## 2010-10-16 NOTE — Telephone Encounter (Signed)
Left message on machine to return my call. 

## 2010-10-16 NOTE — Telephone Encounter (Signed)
Pt returned my call and was advised per Sandford Craze, NP that she felt 1-2 days per episode flare was sufficient as listed on her FMLA forms. If pt continues treatment as prescribed, flares should become less frequent and less severe. Melissa is not going to change FMLA forms at present. Pt voices understanding and requests copies of her work note and letter re: nebulizer treatments at work. Pt requests to pick letters up tomorrow. Pt notified that letters will be at front desk for pick up.

## 2010-10-30 ENCOUNTER — Telehealth: Payer: Self-pay | Admitting: Family

## 2010-10-30 DIAGNOSIS — G4733 Obstructive sleep apnea (adult) (pediatric): Secondary | ICD-10-CM

## 2010-10-30 NOTE — Telephone Encounter (Signed)
Please call patient and let her know that I have reviewed the sleep study results. They are recommending that she start CPAP.  I will place order for home health to bring to her home and set up.  She should work hard on diet, exercise and weight loss.  Also, she should avoid driving while sleepy.

## 2010-10-31 NOTE — Telephone Encounter (Signed)
Pt notified and prefers to use [facility]. Note has been sent to referral coordinator.

## 2010-11-04 ENCOUNTER — Telehealth: Payer: Self-pay | Admitting: *Deleted

## 2010-11-04 NOTE — Telephone Encounter (Signed)
Received call from [place] at [facility] requesting a return call. Attempted to reach [place] and received voicemail. Left message for her to return my call and leave detailed info re: their need on my voicemail.

## 2010-11-07 NOTE — Telephone Encounter (Signed)
Spoke to [place] at [facility]. She states the order they received for pt's CPAP was unsigned. Advised her we would reprint order and fax to them at [phone]. Referral printed and forwarded to Provider for review.

## 2010-11-10 HISTORY — PX: EYE SURGERY: SHX253

## 2010-11-11 NOTE — Telephone Encounter (Signed)
Signed referral faxed to [facility] [facility] [phone].

## 2010-11-18 ENCOUNTER — Encounter: Payer: Self-pay | Admitting: Family

## 2010-11-20 ENCOUNTER — Telehealth: Payer: Self-pay | Admitting: *Deleted

## 2010-11-20 MED ORDER — ALBUTEROL SULFATE HFA 108 (90 BASE) MCG/ACT IN AERS: 2.0000 | INHALATION_SPRAY | RESPIRATORY_TRACT | Status: DC | PRN

## 2010-11-20 MED ORDER — ALBUTEROL SULFATE (2.5 MG/3ML) 0.083% IN NEBU: INHALATION_SOLUTION | RESPIRATORY_TRACT | Status: DC

## 2010-11-20 MED ORDER — BUDESONIDE-FORMOTEROL FUMARATE 160-4.5 MCG/ACT IN AERO: 2.0000 | INHALATION_SPRAY | Freq: Two times a day (BID) | RESPIRATORY_TRACT | Status: DC

## 2010-11-20 NOTE — Telephone Encounter (Signed)
Received message from pt requesting refills on Proventil inhaler, symbicort and albuterol for her nebulizer. Pt was due for f/u in June. Will give 1 month supply only. Left message on pt's voicemail to call office and schedule f/u before she runs out of current supply as she will need to be seen for additional refills.

## 2010-11-26 ENCOUNTER — Encounter: Payer: Self-pay | Admitting: Family

## 2010-11-26 ENCOUNTER — Ambulatory Visit (INDEPENDENT_AMBULATORY_CARE_PROVIDER_SITE_OTHER): Payer: Managed Care, Other (non HMO) | Admitting: Family

## 2010-11-26 ENCOUNTER — Ambulatory Visit: Payer: Managed Care, Other (non HMO) | Admitting: Family

## 2010-11-26 DIAGNOSIS — J45909 Unspecified asthma, uncomplicated: Secondary | ICD-10-CM

## 2010-11-26 NOTE — Patient Instructions (Signed)
Follow up in 6 months, sooner if problems or concerns.  

## 2010-11-26 NOTE — Assessment & Plan Note (Signed)
Patient's asthma is greatly improved. Continue Symbicort as well as albuterol on a when necessary basis. Motor move Prilosec and Singulair for Aeliana Spates she is not currently using these medications.

## 2010-11-26 NOTE — Progress Notes (Signed)
Subjective:    Patient ID: Amber Salas, female    DOB: 1975/10/13, 35 y.o.   MRN: [id number]  HPI Amber Salas is a 35 year old female who presents today for followup of her asthma. Last visit it was recommended that she stop start Prilosec and continue Singulair. She notes that her symptoms are much improved since that time. She notes significant improvement with the use of simple court twice daily. She did not start the Singulair or Prilosec. She reports that she is only needing to use her rescue albuterol inhaler once or twice a week. Review of Systems    see history of present illness  Past Medical History  Diagnosis Date  . Asthma     history of  . Murmur, cardiac     History   Social History  . Marital Status: Married    Spouse Name: N/A    Number of Children: N/A  . Years of Education: N/A   Occupational History  . Not on file.   Social History Main Topics  . Smoking status: Never Smoker   . Smokeless tobacco: Never Used  . Alcohol Use: No  . Drug Use: Not on file  . Sexually Active: Not on file   Other Topics Concern  . 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    Past Surgical History  Procedure Date  . Cesarean section 4/04  . Hernia repair 04/2009    abdominal hernia  . Wisdom tooth extraction 1999    Family History  Problem Relation Age of Onset  . Hypertension Mother   . Diabetes Mother     type II  . Other Mother     cervical dysplasia  . Hypertension Father   . Bipolar disorder Father   . Heart disease Maternal Grandmother   . Hypertension Maternal Grandmother   . Bipolar disorder Maternal Grandmother   . Hypertension Maternal Grandfather   . Hypertension Paternal Grandmother   . Hypertension Paternal Grandfather     Allergies  Allergen Reactions  . Advair Hfa     REACTION: Palpitations, chest pain  . Latex     REACTION:  wheezing, rash  . Spinach     REACTION: Rash    Current Outpatient Prescriptions on File Prior to Visit  Medication Sig Dispense Refill  . albuterol (PROVENTIL HFA) 108 (90 BASE) MCG/ACT inhaler Inhale 2 puffs into the lungs as needed.  1 Inhaler  0  . albuterol (PROVENTIL) (2.5 MG/3ML) 0.083% nebulizer solution Use once daily as needed.  75 mL  0  . budesonide-formoterol (SYMBICORT) 160-4.5 MCG/ACT inhaler Inhale 2 puffs into the lungs 2 (two) times daily.  1 Inhaler  0  . cetirizine (ZYRTEC) 10 MG tablet Take 10 mg by mouth daily as needed.        . fluticasone (FLONASE) 50 MCG/ACT nasal spray 2 sprays by Nasal route daily as needed.  16 g  5  . Multiple Vitamins-Minerals (ONE-A-DAY EXTRAS ANTIOXIDANT) CAPS Take 1 capsule by mouth daily.          BP 128/86  Pulse 78  Temp(Src) 98.2 F (36.8 C) (Oral)  Resp 16  Ht 5\' 6"  (1.676 m)  Wt 369 lb (167.377 kg)  BMI 59.56 kg/m2  SpO2 99%  LMP 11/05/2010    Objective:   Physical Exam  Constitutional: She appears well-developed and well-nourished.  HENT:  Head: Normocephalic and  atraumatic.  Cardiovascular: Normal rate and regular rhythm.   Pulmonary/Chest: Effort normal and breath sounds normal.  Psychiatric: She has a normal mood and affect. Her behavior is normal. Judgment and thought content normal.          Assessment & Plan:

## 2010-12-18 ENCOUNTER — Encounter: Payer: Self-pay | Admitting: Family

## 2010-12-18 ENCOUNTER — Ambulatory Visit: Payer: Managed Care, Other (non HMO) | Admitting: Family

## 2010-12-18 ENCOUNTER — Ambulatory Visit (INDEPENDENT_AMBULATORY_CARE_PROVIDER_SITE_OTHER): Payer: Managed Care, Other (non HMO) | Admitting: Family

## 2010-12-18 DIAGNOSIS — J45901 Unspecified asthma with (acute) exacerbation: Secondary | ICD-10-CM

## 2010-12-18 MED ORDER — PREDNISONE (PAK) 10 MG PO TABS: 10.0000 mg | ORAL_TABLET | Freq: Every day | ORAL | Status: AC

## 2010-12-18 MED ORDER — BUDESONIDE-FORMOTEROL FUMARATE 160-4.5 MCG/ACT IN AERO: 2.0000 | INHALATION_SPRAY | Freq: Two times a day (BID) | RESPIRATORY_TRACT | Status: DC

## 2010-12-18 NOTE — Patient Instructions (Signed)
Call if your symptoms worsen or if they do not improve.   

## 2010-12-18 NOTE — Assessment & Plan Note (Signed)
While she is not overtly wheezing at today's visit, her symptoms are consistent with acute asthma exacerbation.  Will plan to continue current meds and will add a short prednisone taper.

## 2010-12-18 NOTE — Progress Notes (Signed)
Subjective:    Patient ID: Amber Salas, female    DOB: 1976/03/16, 35 y.o.   MRN: [id number]  HPI  Ms.  Prestage is a 35 yr old female who presents today with chief complaint of wheezing.  Symptoms worsened on Sunday night.  She has been using the nebulizer with brief improvement in her symptoms. She continues the symbicort, zyrtec and flonase.  Feels chest tightness with breathing even at rest, though it is worsened with activity.  Notes dry cough which is mild.     Review of Systems See HPI  Past Medical History  Diagnosis Date  . Asthma     history of  . Murmur, cardiac     History   Social History  . Marital Status: Married    Spouse Name: N/A    Number of Children: N/A  . Years of Education: N/A   Occupational History  . Not on file.   Social History Main Topics  . Smoking status: Never Smoker   . Smokeless tobacco: Never Used  . Alcohol Use: No  . Drug Use: Not on file  . Sexually Active: Not on file   Other Topics Concern  . 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    Past Surgical History  Procedure Date  . Cesarean section 4/04  . Hernia repair 04/2009    abdominal hernia  . Wisdom tooth extraction 1999    Family History  Problem Relation Age of Onset  . Hypertension Mother   . Diabetes Mother     type II  . Other Mother     cervical dysplasia  . Hypertension Father   . Bipolar disorder Father   . Heart disease Maternal Grandmother   . Hypertension Maternal Grandmother   . Bipolar disorder Maternal Grandmother   . Hypertension Maternal Grandfather   . Hypertension Paternal Grandmother   . Hypertension Paternal Grandfather     Allergies  Allergen Reactions  . Advair Hfa     REACTION: Palpitations, chest pain  . Latex     REACTION: wheezing, rash  . Spinach     REACTION: Rash    Current Outpatient Prescriptions on File  Prior to Visit  Medication Sig Dispense Refill  . albuterol (PROVENTIL HFA) 108 (90 BASE) MCG/ACT inhaler Inhale 2 puffs into the lungs as needed.  1 Inhaler  0  . albuterol (PROVENTIL) (2.5 MG/3ML) 0.083% nebulizer solution Use once daily as needed.  75 mL  0  . cetirizine (ZYRTEC) 10 MG tablet Take 10 mg by mouth daily as needed.        . fluticasone (FLONASE) 50 MCG/ACT nasal spray 2 sprays by Nasal route daily as needed.  16 g  5  . Multiple Vitamins-Minerals (ONE-A-DAY EXTRAS ANTIOXIDANT) CAPS Take 1 capsule by mouth daily.          BP 130/70  Pulse 78  Temp(Src) 98.2 F (36.8 C) (Oral)  Resp 18  Ht 5\' 6"  (1.676 m)  Wt 368 lb 1.3 oz (166.96 kg)  BMI 59.41 kg/m2  SpO2 98%  LMP 12/04/2010        Objective:   Physical Exam  Constitutional: She appears well-developed and well-nourished.  HENT:  Head: Normocephalic and atraumatic.  Eyes: Conjunctivae are normal.  Cardiovascular: Normal rate and regular rhythm.   Pulmonary/Chest: Effort normal and breath sounds normal.  Psychiatric: She has a normal  mood and affect. Her behavior is normal. Judgment and thought content normal.          Assessment & Plan:

## 2011-01-30 LAB — CBC
HCT: 33.3 — ABNORMAL LOW
HCT: 34.5 — ABNORMAL LOW
Hemoglobin: 11.2 — ABNORMAL LOW
Hemoglobin: 11.8 — ABNORMAL LOW
Hemoglobin: 10.5 — ABNORMAL LOW
MCHC: 34.2
MCV: 87.7
MCV: 87.8
MCV: 87.9
Platelets: 226
RBC: 3.93
RBC: 3.49 — ABNORMAL LOW
RDW: 13.7
WBC: 11.7 — ABNORMAL HIGH
WBC: 13.4 — ABNORMAL HIGH

## 2011-01-30 LAB — URINE MICROSCOPIC-ADD ON

## 2011-01-30 LAB — URINALYSIS, ROUTINE W REFLEX MICROSCOPIC
Bilirubin Urine: NEGATIVE
Glucose, UA: NEGATIVE
Ketones, ur: NEGATIVE
pH: 6

## 2011-01-30 LAB — COMPREHENSIVE METABOLIC PANEL
ALT: 18
AST: 16
AST: 45 — ABNORMAL HIGH
CO2: 20
CO2: 24
Calcium: 9.1
Chloride: 110
Creatinine, Ser: 0.48
GFR calc Af Amer: >60
GFR calc Af Amer: >60
GFR calc non Af Amer: >60
GFR calc non Af Amer: >60
Glucose, Bld: 110 — ABNORMAL HIGH
Sodium: 134 — ABNORMAL LOW
Sodium: 139
Total Bilirubin: 0.5
Total Protein: 6.2

## 2011-01-30 LAB — LACTATE DEHYDROGENASE: LDH: 114

## 2011-02-11 LAB — POCT URINALYSIS DIP (DEVICE)
Glucose, UA: NEGATIVE
Hgb urine dipstick: NEGATIVE
Nitrite: NEGATIVE
Operator id: 247071
Urobilinogen, UA: 0.2

## 2011-02-11 LAB — POCT PREGNANCY, URINE: Preg Test, Ur: NEGATIVE

## 2011-02-14 LAB — WET PREP, GENITAL: Yeast Wet Prep HPF POC: NONE SEEN

## 2011-02-17 LAB — URINALYSIS, ROUTINE W REFLEX MICROSCOPIC
Glucose, UA: NEGATIVE
Ketones, ur: NEGATIVE
pH: 6.5

## 2011-03-25 ENCOUNTER — Telehealth: Payer: Self-pay | Admitting: *Deleted

## 2011-03-25 NOTE — Telephone Encounter (Signed)
Pt left message stating it is time to renew her FMLA paperwork (due every 6 months). Pt is due for follow up in January. Pt requests [place] to call her if we need her to fax renewal paperwork to [place]. Asks that we leave message on her cell.  Will pt need to be seen for renewal of FMLA?

## 2011-03-26 NOTE — Telephone Encounter (Signed)
Left message on machine to fax or drop copy of FMLA paperwork by our office and to call if any questions.

## 2011-03-26 NOTE — Telephone Encounter (Signed)
We will need a copy of her FMLA.  I do not need to see her back prior to completing.

## 2011-04-08 ENCOUNTER — Telehealth: Payer: Self-pay | Admitting: Family

## 2011-04-08 NOTE — Telephone Encounter (Signed)
Left message for pt requesting that she obtain the proper FMLA paperwork as the one provided was for a family member and she will need one for her own medical leave.

## 2011-04-18 NOTE — Telephone Encounter (Signed)
Pt called stating [name] has not received her FMLA paperwork from [place]. Advised pt per instruction below and asked her to call [employer] to have them send FMLA forms for employee. Pt will request correct form from [employer].

## 2011-04-22 ENCOUNTER — Ambulatory Visit (INDEPENDENT_AMBULATORY_CARE_PROVIDER_SITE_OTHER): Payer: Managed Care, Other (non HMO) | Admitting: Family

## 2011-04-22 ENCOUNTER — Encounter: Payer: Self-pay | Admitting: Family

## 2011-04-22 VITALS — BP 130/80 | HR 84 | Temp 98.2°F | Resp 18 | Ht 66.0 in | Wt 364.1 lb

## 2011-04-22 DIAGNOSIS — J45901 Unspecified asthma with (acute) exacerbation: Secondary | ICD-10-CM

## 2011-04-22 DIAGNOSIS — J029 Acute pharyngitis, unspecified: Secondary | ICD-10-CM

## 2011-04-22 LAB — POCT RAPID STREP A (OFFICE): Rapid Strep A Screen: NEGATIVE

## 2011-04-22 MED ORDER — PREDNISONE 10 MG PO TABS: ORAL_TABLET | ORAL | Status: DC

## 2011-04-22 NOTE — Progress Notes (Signed)
Subjective:    Patient ID: Amber Salas, female    DOB: [birth year], 35 y.o.   MRN: [id number]  HPI  [name] is a 35 yr old female who presents with chief complaint of chest tightness.  She reports + associated sore throat and ear pain.  Denies fever.  Symptoms started yesterday. She is using albuterol and symbicort without significant improvement.     Review of Systems See HPI  Past Medical History  Diagnosis Date  . Asthma     history of  . Murmur, cardiac     History   Social History  . Marital Status: Married    Spouse Name: N/A    Number of Children: N/A  . Years of Education: N/A   Occupational History  . Not on file.   Social History Main Topics  . Smoking status: Never Smoker   . Smokeless tobacco: Never Used  . Alcohol Use: No  . Drug Use: Not on file  . Sexually Active: Not on file   Other Topics Concern  . 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    Past Surgical History  Procedure Date  . Cesarean section 4/04  . Hernia repair 04/2009    abdominal hernia  . Wisdom tooth extraction 1999    Family History  Problem Relation Age of Onset  . Hypertension Mother   . Diabetes Mother     type II  . Other Mother     cervical dysplasia  . Hypertension Father   . Bipolar disorder Father   . Heart disease Maternal Grandmother   . Hypertension Maternal Grandmother   . Bipolar disorder Maternal Grandmother   . Hypertension Maternal Grandfather   . Hypertension Paternal Grandmother   . Hypertension Paternal Grandfather     Allergies  Allergen Reactions  . Advair Hfa     REACTION: Palpitations, chest pain  . Latex     REACTION: wheezing, rash  . Spinach     REACTION: Rash    Current Outpatient Prescriptions on File Prior to Visit  Medication Sig Dispense Refill  . albuterol (PROVENTIL HFA) 108 (90 BASE) MCG/ACT inhaler Inhale  2 puffs into the lungs as needed.  1 Inhaler  0  . albuterol (PROVENTIL) (2.5 MG/3ML) 0.083% nebulizer solution Use once daily as needed.  75 mL  0  . budesonide-formoterol (SYMBICORT) 160-4.5 MCG/ACT inhaler Inhale 2 puffs into the lungs 2 (two) times daily.  1 Inhaler  5  . cetirizine (ZYRTEC) 10 MG tablet Take 10 mg by mouth daily as needed.        . fluticasone (FLONASE) 50 MCG/ACT nasal spray 2 sprays by Nasal route daily as needed.  16 g  5  . Multiple Vitamins-Minerals (ONE-A-DAY EXTRAS ANTIOXIDANT) CAPS Take 1 capsule by mouth daily.          BP 130/80  Pulse 84  Temp(Src) 98.2 F (36.8 C) (Oral)  Resp 18  Ht 5\' 6"  (1.676 m)  Wt 364 lb 1.9 oz (165.164 kg)  BMI 58.77 kg/m2  SpO2 98%       Objective:   Physical Exam  Constitutional: She appears well-developed and well-nourished.  HENT:  Head: Normocephalic and atraumatic.  Right Ear: Tympanic membrane and ear canal normal.  Left Ear: Tympanic membrane and ear canal normal.  Mouth/Throat: No posterior oropharyngeal edema or posterior oropharyngeal erythema.  Cardiovascular: Normal  rate and regular rhythm.   No murmur heard. Pulmonary/Chest: Breath sounds normal. No respiratory distress. She has no wheezes. She has no rales. She exhibits no tenderness.  Psychiatric: She has a normal mood and affect. Her behavior is normal. Judgment and thought content normal.          Assessment & Plan:

## 2011-04-22 NOTE — Telephone Encounter (Signed)
Notified pt that we have not received the correct forms yet. She states she will contact [employer] again.

## 2011-04-22 NOTE — Patient Instructions (Signed)
Call if you develop fever >101, worsening shortness of breath or if you are not feeling better in 2-3 days.

## 2011-04-22 NOTE — Assessment & Plan Note (Addendum)
Asthma exacerbation in setting of URI.  Will plan to treat with prednisone taper. Continue symbicort and albuterol.

## 2011-05-01 ENCOUNTER — Telehealth: Payer: Self-pay | Admitting: *Deleted

## 2011-05-01 NOTE — Telephone Encounter (Signed)
Received fax from pt for FMLA paperwork completion. Paperwork forwarded to Provider for completion and signature.

## 2011-05-02 NOTE — Telephone Encounter (Signed)
Call placed to patient she was informed FMLA paper work was available at front desk for patient pick up

## 2011-05-02 NOTE — Telephone Encounter (Addendum)
Pls call pt and let her know that her FMLA paper work is completed and available for pick up at the front desk.

## 2011-05-05 DIAGNOSIS — Z0279 Encounter for issue of other medical certificate: Secondary | ICD-10-CM

## 2011-05-26 ENCOUNTER — Encounter: Payer: Self-pay | Admitting: Family

## 2011-05-26 ENCOUNTER — Ambulatory Visit (INDEPENDENT_AMBULATORY_CARE_PROVIDER_SITE_OTHER): Payer: Managed Care, Other (non HMO) | Admitting: Family

## 2011-05-26 VITALS — BP 138/84 | HR 98 | Temp 98.0°F | Resp 16 | Wt 377.0 lb

## 2011-05-26 DIAGNOSIS — N926 Irregular menstruation, unspecified: Secondary | ICD-10-CM

## 2011-05-26 DIAGNOSIS — J45909 Unspecified asthma, uncomplicated: Secondary | ICD-10-CM

## 2011-05-26 DIAGNOSIS — G4730 Sleep apnea, unspecified: Secondary | ICD-10-CM

## 2011-05-26 LAB — ESTRADIOL: Estradiol: 107.5 pg/mL

## 2011-05-26 LAB — PROLACTIN: Prolactin: 9.1 ng/mL

## 2011-05-26 MED ORDER — FLUTICASONE PROPIONATE 50 MCG/ACT NA SUSP: 2.0000 | Freq: Every day | NASAL | Status: DC | PRN

## 2011-05-26 NOTE — Progress Notes (Signed)
Subjective:    Patient ID: Amber Salas, female    DOB: Jul 23, 1975, 36 y.o.   MRN: [id number]  HPI  [name] is a 36 yr old female who presents today for follow up.    Asthma- notes that she is "a little tight." Due to rain.  Notes that her children have had pneumonia.  Has mild cough.  No fever.  Using nebulizer.  She continues symbicort- but tells me that she was using only once a day- didn't realize it was a bid med.  Not using flonase- requests refill.  Periods- Notes irregular menses, sometimes spotting, sometimes heavy.  + Hot flashes.  Notes fam hx of early menopause.  OSA- still sleepy a lot during the day.   Now ready to stat CPAP. Review of Systems See HPI  Past Medical History  Diagnosis Date  . Asthma     history of  . Murmur, cardiac     History   Social History  . Marital Status: Married    Spouse Name: N/A    Number of Children: N/A  . Years of Education: N/A   Occupational History  . Not on file.   Social History Main Topics  . Smoking status: Never Smoker   . Smokeless tobacco: Never Used  . Alcohol Use: No  . Drug Use: Not on file  . Sexually Active: Not on file   Other Topics Concern  . 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    Past Surgical History  Procedure Date  . Cesarean section 4/04  . Hernia repair 04/2009    abdominal hernia  . Wisdom tooth extraction 1999    Family History  Problem Relation Age of Onset  . Hypertension Mother   . Diabetes Mother     type II  . Other Mother     cervical dysplasia  . Hypertension Father   . Bipolar disorder Father   . Heart disease Maternal Grandmother   . Hypertension Maternal Grandmother   . Bipolar disorder Maternal Grandmother   . Hypertension Maternal Grandfather   . Hypertension Paternal Grandmother   . Hypertension Paternal Grandfather     Allergies  Allergen  Reactions  . Advair Hfa     REACTION: Palpitations, chest pain  . Latex     REACTION: wheezing, rash  . Spinach     REACTION: Rash    Current Outpatient Prescriptions on File Prior to Visit  Medication Sig Dispense Refill  . albuterol (PROVENTIL HFA) 108 (90 BASE) MCG/ACT inhaler Inhale 2 puffs into the lungs as needed.  1 Inhaler  0  . albuterol (PROVENTIL) (2.5 MG/3ML) 0.083% nebulizer solution Use once daily as needed.  75 mL  0  . budesonide-formoterol (SYMBICORT) 160-4.5 MCG/ACT inhaler Inhale 2 puffs into the lungs 2 (two) times daily.  1 Inhaler  5  . cetirizine (ZYRTEC) 10 MG tablet Take 10 mg by mouth daily as needed.        . Multiple Vitamins-Minerals (ONE-A-DAY EXTRAS ANTIOXIDANT) CAPS Take 1 capsule by mouth daily.          BP 138/84  Pulse 98  Temp(Src) 98 F (36.7 C) (Oral)  Resp 16  Wt 377 lb (171.006 kg)  SpO2 98%  LMP 05/12/2011       Objective:   Physical Exam  Constitutional: She appears well-developed and well-nourished. No distress.  HENT:  Head: Normocephalic and atraumatic.  Right Ear: Tympanic membrane and ear canal normal.  Left Ear: Tympanic membrane and ear canal normal.  Mouth/Throat: No posterior oropharyngeal edema or posterior oropharyngeal erythema.  Cardiovascular: Normal rate and regular rhythm.   No murmur heard. Pulmonary/Chest: Breath sounds normal. No respiratory distress. She has no wheezes. She has no rales.  Lymphadenopathy:    She has no cervical adenopathy.  Psychiatric: She has a normal mood and affect. Her behavior is normal. Judgment and thought content normal.          Assessment & Plan:

## 2011-05-26 NOTE — Assessment & Plan Note (Signed)
Stable, increase symbicort to BID.

## 2011-05-26 NOTE — Patient Instructions (Signed)
Schedule a fasting physical at the front desk.  

## 2011-05-26 NOTE — Assessment & Plan Note (Signed)
She is now agreeable to start CPAP. Will request Home health to bring supplies to her home.  I think this will help a lot with her daytime somnolence.

## 2011-05-27 ENCOUNTER — Ambulatory Visit (INDEPENDENT_AMBULATORY_CARE_PROVIDER_SITE_OTHER): Payer: Managed Care, Other (non HMO) | Admitting: Family

## 2011-05-27 ENCOUNTER — Encounter: Payer: Self-pay | Admitting: Family

## 2011-05-27 DIAGNOSIS — Z0000 Encounter for general adult medical examination without abnormal findings: Secondary | ICD-10-CM

## 2011-05-27 DIAGNOSIS — R739 Hyperglycemia, unspecified: Secondary | ICD-10-CM

## 2011-05-27 DIAGNOSIS — Z23 Encounter for immunization: Secondary | ICD-10-CM

## 2011-05-27 DIAGNOSIS — R7309 Other abnormal glucose: Secondary | ICD-10-CM

## 2011-05-27 NOTE — Progress Notes (Signed)
Subjective:    Patient ID: Amber Salas, female    DOB: 06/28/1975, 36 y.o.   MRN: [id number]  HPI   Amber Salas is here for CPX.  Last pap was 3/12.  She started walking and doing pilates.  Diet is better, she is weighing her food.  Joined a weight loss program at church.  Wants a flu shot.      Review of Systems  Constitutional: Negative for unexpected weight change.  HENT: Negative for congestion.   Eyes: Negative for visual disturbance.  Respiratory: Negative for choking.   Cardiovascular: Negative for chest pain and palpitations.  Gastrointestinal: Negative for nausea, vomiting and diarrhea.  Genitourinary: Negative for dysuria and frequency.  Musculoskeletal:       Occasional left hip pain.  Skin:       Denies rashes or lesions  Neurological: Negative for headaches.  Hematological: Negative for adenopathy.  Psychiatric/Behavioral:       Denies depression/anxiety   Past Medical History  Diagnosis Date  . Asthma     history of  . Murmur, cardiac   . Retinal hole or tear     left eye- 1996 due to injury    History   Social History  . Marital Status: Married    Spouse Name: N/A    Number of Children: N/A  . Years of Education: N/A   Occupational History  . Not on file.   Social History Main Topics  . Smoking status: Never Smoker   . Smokeless tobacco: Never Used  . Alcohol Use: No  . Drug Use: Not on file  . Sexually Active: Not on file   Other Topics Concern  . 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    Past Surgical History  Procedure Date  . Cesarean section 4/04  . Hernia repair 04/2009    abdominal hernia  . Wisdom tooth extraction 1999  . Eye surgery 11/2010    lasar repair of retinal tear    Family History  Problem Relation Age of Onset  . Hypertension Mother   . Diabetes Mother     type II  . Other Mother     cervical  dysplasia  . Hypertension Father   . Bipolar disorder Father   . Heart disease Maternal Grandmother   . Hypertension Maternal Grandmother   . Bipolar disorder Maternal Grandmother   . Hypertension Maternal Grandfather   . Hypertension Paternal Grandmother   . Hypertension Paternal Grandfather     Allergies  Allergen Reactions  . Advair Hfa     REACTION: Palpitations, chest pain  . Latex     REACTION: wheezing, rash  . Spinach     REACTION: Rash    Current Outpatient Prescriptions on File Prior to Visit  Medication Sig Dispense Refill  . albuterol (PROVENTIL HFA) 108 (90 BASE) MCG/ACT inhaler Inhale 2 puffs into the lungs as needed.  1 Inhaler  0  . albuterol (PROVENTIL) (2.5 MG/3ML) 0.083% nebulizer solution Use once daily as needed.  75 mL  0  . budesonide-formoterol (SYMBICORT) 160-4.5 MCG/ACT inhaler Inhale 2 puffs into the lungs 2 (two) times daily.  1 Inhaler  5  . cetirizine (ZYRTEC) 10 MG tablet Take 10 mg by mouth daily as needed.        . fluticasone (FLONASE) 50 MCG/ACT nasal spray Place 2 sprays into the nose daily as  needed.  16 g  5  . Multiple Vitamins-Minerals (ONE-A-DAY EXTRAS ANTIOXIDANT) CAPS Take 1 capsule by mouth daily.          BP 120/84  Pulse 84  Temp(Src) 98.1 F (36.7 C) (Oral)  Resp 16  Wt 375 lb (170.099 kg)  LMP 05/12/2011       Objective:   Physical Exam  Constitutional: She is oriented to person, place, and time. She appears well-developed and well-nourished. No distress.  HENT:  Head: Normocephalic and atraumatic.  Eyes: Pupils are equal, round, and reactive to light.  Neck: Normal range of motion. Neck supple. No thyromegaly present.  Cardiovascular: Normal rate and regular rhythm.   No murmur heard. Pulmonary/Chest: Effort normal and breath sounds normal. No respiratory distress. She has no wheezes. She has no rales. She exhibits no tenderness.  Abdominal: Soft. Bowel sounds are normal. She exhibits no distension. There is no  tenderness. There is no rebound and no guarding.  Genitourinary:       Breast exam is normal without masses, dimpling.  No axillary LAD  Musculoskeletal: She exhibits no edema.  Lymphadenopathy:    She has no cervical adenopathy.  Neurological: She is alert and oriented to person, place, and time. She displays normal reflexes. No cranial nerve deficit. She exhibits normal muscle tone. Coordination normal.  Skin: Skin is warm.  Psychiatric: She has a normal mood and affect. Her behavior is normal. Judgment and thought content normal.          Assessment & Plan:

## 2011-05-27 NOTE — Patient Instructions (Signed)
Please complete your lab work prior to leaving today.  Follow up in 6 months, sooner if problems or concerns.  

## 2011-05-27 NOTE — Assessment & Plan Note (Signed)
36 yr old female here today for annual CPX.  We discussed healthy diet, exercise and weight loss.  She had a normal pap in March 2012.  She reports regular paps and always normal.  Will plan to repeat pap 3/14.  Fasting labs today.

## 2011-05-28 ENCOUNTER — Encounter: Payer: Managed Care, Other (non HMO) | Admitting: Family

## 2011-05-28 LAB — HEPATIC FUNCTION PANEL
AST: 13 U/L (ref 0–37)
Albumin: 4 g/dL (ref 3.5–5.2)
Alkaline Phosphatase: 62 U/L (ref 39–117)
Total Protein: 7.1 g/dL (ref 6.0–8.3)

## 2011-05-28 LAB — LIPID PANEL: LDL Cholesterol: 62 mg/dL (ref 0–99)

## 2011-05-28 LAB — HEMOGLOBIN A1C: Hgb A1c MFr Bld: 5.8 % — ABNORMAL HIGH (ref <5.7)

## 2011-05-28 LAB — BASIC METABOLIC PANEL WITH GFR
Calcium: 9.2 mg/dL (ref 8.4–10.5)
Creat: 0.72 mg/dL (ref 0.50–1.10)
GFR, Est African American: >89 mL/min
GFR, Est Non African American: >89 mL/min

## 2011-06-04 ENCOUNTER — Telehealth: Payer: Self-pay | Admitting: *Deleted

## 2011-06-04 NOTE — Telephone Encounter (Signed)
Received fax from [facility] requesting copy of most recent Sleep study result so they may process our order for CPAP machine. Also requested new order if heated humidifier would be needed with CPAP. Per Melissa, humidifier not needed. Copy of result faxed.

## 2011-06-20 ENCOUNTER — Telehealth: Payer: Self-pay | Admitting: *Deleted

## 2011-06-20 NOTE — Telephone Encounter (Signed)
Rx has been completed

## 2011-06-20 NOTE — Telephone Encounter (Signed)
Received message from pt stating her mouth is very dry in the mornings from using her CPAP. Wants to know if we will send rx for a heated humidifier to [facility]. Pt states we may leave a detailed message on her cell.  Please advise.

## 2011-06-20 NOTE — Telephone Encounter (Signed)
Rx signed and faxed to [facility] at [phone]. Left detailed message of rx completion on pt's voicemail and to call if any questions.

## 2011-06-26 ENCOUNTER — Telehealth: Payer: Self-pay | Admitting: *Deleted

## 2011-06-26 NOTE — Telephone Encounter (Signed)
Received call from pt requesting status of Health Screening Form. Notified pt on 06/25/11 that Form was completed and faxed to [facility] [phone].

## 2011-07-05 ENCOUNTER — Emergency Department (HOSPITAL_BASED_OUTPATIENT_CLINIC_OR_DEPARTMENT_OTHER)
Admission: EM | Admit: 2011-07-05 | Discharge: 2011-07-05 | Disposition: A | Discharge status: Home / Self Care | Payer: Managed Care, Other (non HMO) | Attending: Emergency Medicine | Admitting: Emergency Medicine

## 2011-07-05 ENCOUNTER — Encounter (HOSPITAL_BASED_OUTPATIENT_CLINIC_OR_DEPARTMENT_OTHER): Payer: Self-pay | Admitting: *Deleted

## 2011-07-05 ENCOUNTER — Emergency Department (INDEPENDENT_AMBULATORY_CARE_PROVIDER_SITE_OTHER): Payer: Managed Care, Other (non HMO)

## 2011-07-05 DIAGNOSIS — W19XXXA Unspecified fall, initial encounter: Secondary | ICD-10-CM

## 2011-07-05 DIAGNOSIS — G4733 Obstructive sleep apnea (adult) (pediatric): Secondary | ICD-10-CM | POA: Insufficient documentation

## 2011-07-05 DIAGNOSIS — R209 Unspecified disturbances of skin sensation: Secondary | ICD-10-CM

## 2011-07-05 DIAGNOSIS — M79609 Pain in unspecified limb: Secondary | ICD-10-CM | POA: Insufficient documentation

## 2011-07-05 DIAGNOSIS — M549 Dorsalgia, unspecified: Secondary | ICD-10-CM

## 2011-07-05 DIAGNOSIS — J45909 Unspecified asthma, uncomplicated: Secondary | ICD-10-CM | POA: Insufficient documentation

## 2011-07-05 HISTORY — DX: Obstructive sleep apnea (adult) (pediatric): G47.33

## 2011-07-05 HISTORY — DX: Dependence on other enabling machines and devices: Z99.89

## 2011-07-05 MED ORDER — OXYCODONE-ACETAMINOPHEN 5-325 MG PO TABS: 1.0000 | ORAL_TABLET | Freq: Once | ORAL | Status: AC

## 2011-07-05 MED ORDER — DIAZEPAM 5 MG PO TABS
5.0000 mg | ORAL_TABLET | Freq: Once | ORAL | Status: AC
Start: 2011-07-05 — End: 2011-07-05
  Administered 2011-07-05: 5 mg via ORAL
  Filled 2011-07-05: qty 1

## 2011-07-05 MED ORDER — IBUPROFEN 800 MG PO TABS
800.0000 mg | ORAL_TABLET | Freq: Once | ORAL | Status: AC
  Administered 2011-07-05: 800 mg via ORAL
  Filled 2011-07-05: qty 1

## 2011-07-05 MED ORDER — DIAZEPAM 5 MG PO TABS: 5.0000 mg | ORAL_TABLET | Freq: Once | ORAL | Status: AC

## 2011-07-05 MED ORDER — OXYCODONE-ACETAMINOPHEN 5-325 MG PO TABS
1.0000 | ORAL_TABLET | Freq: Once | ORAL | Status: AC
  Administered 2011-07-05: 1 via ORAL
  Filled 2011-07-05: qty 1

## 2011-07-05 NOTE — Discharge Instructions (Signed)
Back Pain, Adult Low back pain is very common. About 1 in 5 people have back pain.The cause of low back pain is rarely dangerous. The pain often gets better over time.About half of people with a sudden onset of back pain feel better in just 2 weeks. About 8 in 10 people feel better by 6 weeks.  CAUSES Some common causes of back pain include:  Strain of the muscles or ligaments supporting the spine.   Wear and tear (degeneration) of the spinal discs.   Arthritis.   Direct injury to the back.  DIAGNOSIS Most of the time, the direct cause of low back pain is not known.However, back pain can be treated effectively even when the exact cause of the pain is unknown.Answering your caregiver's questions about your overall health and symptoms is one of the most accurate ways to make sure the cause of your pain is not dangerous. If your caregiver needs more information, he or she may order lab work or imaging tests (X-rays or MRIs).However, even if imaging tests show changes in your back, this usually does not require surgery. HOME CARE INSTRUCTIONS For many people, back pain returns.Since low back pain is rarely dangerous, it is often a condition that people can learn to manageon their own.   Remain active. It is stressful on the back to sit or stand in one place. Do not sit, drive, or stand in one place for more than 30 minutes at a time. Take short walks on level surfaces as soon as pain allows.Try to increase the length of time you walk each day.   Do not stay in bed.Resting more than 1 or 2 days can delay your recovery.   Do not avoid exercise or work.Your body is made to move.It is not dangerous to be active, even though your back may hurt.Your back will likely heal faster if you return to being active before your pain is gone.   Pay attention to your body when you bend and lift. Many people have less discomfortwhen lifting if they bend their knees, keep the load close to their  bodies,and avoid twisting. Often, the most comfortable positions are those that put less stress on your recovering back.   Find a comfortable position to sleep. Use a firm mattress and lie on your side with your knees slightly bent. If you lie on your back, put a pillow under your knees.   Only take over-the-counter or prescription medicines as directed by your caregiver. Over-the-counter medicines to reduce pain and inflammation are often the most helpful.Your caregiver may prescribe muscle relaxant drugs.These medicines help dull your pain so you can more quickly return to your normal activities and healthy exercise.   Put ice on the injured area.   Put ice in a plastic bag.   Place a towel between your skin and the bag.   Leave the ice on for 15 to 20 minutes, 3 to 4 times a day for the first 2 to 3 days. After that, ice and heat may be alternated to reduce pain and spasms.   Ask your caregiver about trying back exercises and gentle massage. This may be of some benefit.   Avoid feeling anxious or stressed.Stress increases muscle tension and can worsen back pain.It is important to recognize when you are anxious or stressed and learn ways to manage it.Exercise is a great option.  SEEK MEDICAL CARE IF:  You have pain that is not relieved with rest or medicine.   You have   pain that does not improve in 1 week.   You have new symptoms.   You are generally not feeling well.  SEEK IMMEDIATE MEDICAL CARE IF:   You have pain that radiates from your back into your legs.   You develop new bowel or bladder control problems.   You have unusual weakness or numbness in your arms or legs.   You develop nausea or vomiting.   You develop abdominal pain.   You feel faint.  Document Released: 04/28/2005 Document Revised: 01/08/2011 Document Reviewed: 09/16/2010 [facility] Patient Information 2012 [place], [place].Muscle Strain A muscle strain, or pulled muscle, occurs when a muscle is  over-stretched. A small number of muscle fibers may also be torn. This is especially common in athletes. This happens when a sudden violent force placed on a muscle pushes it past its capacity. Usually, recovery from a pulled muscle takes 1 to 2 weeks. But complete healing will take 5 to 6 weeks. There are millions of muscle fibers. Following injury, your body will usually return to normal quickly. HOME CARE INSTRUCTIONS   While awake, apply ice to the sore muscle for 15 to 20 minutes each hour for the first 2 days. Put ice in a plastic bag and place a towel between the bag of ice and your skin.   Do not use the pulled muscle for several days. Do not use the muscle if you have pain.   You may wrap the injured area with an elastic bandage for comfort. Be careful not to bind it too tightly. This may interfere with blood circulation.   Only take over-the-counter or prescription medicines for pain, discomfort, or fever as directed by your caregiver. Do not use aspirin as this will increase bleeding (bruising) at injury site.   Warming up before exercise helps prevent muscle strains.  SEEK MEDICAL CARE IF:  There is increased pain or swelling in the affected area. MAKE SURE YOU:   Understand these instructions.   Will watch your condition.   Will get help right away if you are not doing well or get worse.  Document Released: 04/28/2005 Document Revised: 01/08/2011 Document Reviewed: 11/25/2006 [facility] Patient Information 2012 [place], [place].

## 2011-07-05 NOTE — ED Notes (Signed)
Pt reports she twisted her leg at work 1 week ago but did not fall- painful to walk

## 2011-07-05 NOTE — ED Provider Notes (Signed)
History     CSN: [id number]  Arrival date & time 07/05/11  4098   First MD Initiated Contact with Patient 07/05/11 1908      Chief Complaint  Patient presents with  . Leg Pain    (Consider location/radiation/quality/duration/timing/severity/associated sxs/prior treatment) Patient is a 36 y.o. female presenting with leg pain. The history is provided by the patient.  Leg Pain    patient presents with left lower back pain x1 week. Patient injured her back when stepping incorrectly. Symptoms were improving with motrin initially but have since become worse. No loss of bowel or bladder function. Pain worse with movement better with rest. She is not dragging her foot.  Past Medical History  Diagnosis Date  . Asthma     history of  . Murmur, cardiac   . Retinal hole or tear     left eye- 1996 due to injury  . Obstructive sleep apnea on CPAP     Past Surgical History  Procedure Date  . Cesarean section 4/04  . Hernia repair 04/2009    abdominal hernia  . Wisdom tooth extraction 1999  . Eye surgery 11/2010    lasar repair of retinal tear  . Tubal ligation     Family History  Problem Relation Age of Onset  . Hypertension Mother   . Diabetes Mother     type II  . Other Mother     cervical dysplasia  . Hypertension Father   . Bipolar disorder Father   . Heart disease Maternal Grandmother   . Hypertension Maternal Grandmother   . Bipolar disorder Maternal Grandmother   . Hypertension Maternal Grandfather   . Hypertension Paternal Grandmother   . Hypertension Paternal Grandfather     History  Substance Use Topics  . Smoking status: Never Smoker   . Smokeless tobacco: Never Used  . Alcohol Use: No    OB History    Grav Para Term Preterm Abortions TAB SAB Ect Mult Living                  Review of Systems  All other systems reviewed and are negative.    Allergies  Advair hfa; Latex; and Spinach  Home Medications   Current Outpatient Rx  Name Route Sig  Dispense Refill  . ALBUTEROL SULFATE HFA 108 (90 BASE) MCG/ACT IN AERS Inhalation Inhale 2 puffs into the lungs every 4 (four) hours as needed. For shortness of breath or wheezing    . ALBUTEROL SULFATE (2.5 MG/3ML) 0.083% IN NEBU Nebulization Take 2.5 mg by nebulization every 6 (six) hours as needed. For shortness of breath or wheezing    . BUDESONIDE-FORMOTEROL FUMARATE 160-4.5 MCG/ACT IN AERO Inhalation Inhale 2 puffs into the lungs 2 (two) times daily. 1 Inhaler 5  . IBUPROFEN 200 MG PO TABS Oral Take 400 mg by mouth once as needed. For pain    . ADULT MULTIVITAMIN W/MINERALS CH Oral Take 1 tablet by mouth daily.      BP 149/86  Pulse 77  Temp(Src) 98.1 F (36.7 C) (Oral)  Resp 20  SpO2 100%  LMP 06/20/2011  Physical Exam  Nursing note and vitals reviewed. Constitutional: She is oriented to person, place, and time. She appears well-developed and well-nourished.  Non-toxic appearance. No distress.  HENT:  Head: Normocephalic and atraumatic.  Eyes: Conjunctivae, EOM and lids are normal. Pupils are equal, round, and reactive to light.  Neck: Normal range of motion. Neck supple. No tracheal deviation present. No mass present.  Cardiovascular: Normal rate, regular rhythm and normal heart sounds.  Exam reveals no gallop.   No murmur heard. Pulmonary/Chest: Effort normal and breath sounds normal. No stridor. No respiratory distress. She has no decreased breath sounds. She has no wheezes. She has no rhonchi. She has no rales.  Abdominal: Soft. Normal appearance and bowel sounds are normal. She exhibits no distension. There is no tenderness. There is no rebound and no CVA tenderness.  Musculoskeletal: Normal range of motion. She exhibits no edema and no tenderness.       Left l/s paraspinal pain  Neurological: She is alert and oriented to person, place, and time. She has normal strength. No cranial nerve deficit or sensory deficit. GCS eye subscore is 4. GCS verbal subscore is 5. GCS motor  subscore is 6.  Skin: Skin is warm and dry. No abrasion and no rash noted.  Psychiatric: She has a normal mood and affect. Her speech is normal and behavior is normal.    ED Course  Procedures (including critical care time)  Labs Reviewed - No data to display No results found.   No diagnosis found.    MDM  Pain meds given and pt feels better--neuro stable        Toy Baker, MD 07/05/11 2053

## 2011-08-14 ENCOUNTER — Telehealth: Payer: Self-pay | Admitting: *Deleted

## 2011-08-14 NOTE — Telephone Encounter (Signed)
Patient called and left voice message stating she is out of symbicort and would like to know if she could stop by the office to obtain a sample.

## 2011-08-14 NOTE — Telephone Encounter (Signed)
Call placed to patient at [phone], no answer. A voice message was left informing patient sample of Symbicort left at front desk for patient pick up.  Symbicort 160/4.5 Lot# [id number] EXP 09/2012  X 2 left for patient pick up at front desk.

## 2011-08-27 ENCOUNTER — Emergency Department (HOSPITAL_BASED_OUTPATIENT_CLINIC_OR_DEPARTMENT_OTHER)
Admission: EM | Admit: 2011-08-27 | Discharge: 2011-08-27 | Disposition: A | Discharge status: Home / Self Care | Payer: Managed Care, Other (non HMO) | Attending: Emergency Medicine | Admitting: Emergency Medicine

## 2011-08-27 ENCOUNTER — Encounter (HOSPITAL_BASED_OUTPATIENT_CLINIC_OR_DEPARTMENT_OTHER): Payer: Self-pay

## 2011-08-27 ENCOUNTER — Emergency Department (INDEPENDENT_AMBULATORY_CARE_PROVIDER_SITE_OTHER): Payer: Managed Care, Other (non HMO)

## 2011-08-27 DIAGNOSIS — R0609 Other forms of dyspnea: Secondary | ICD-10-CM | POA: Insufficient documentation

## 2011-08-27 DIAGNOSIS — R079 Chest pain, unspecified: Secondary | ICD-10-CM | POA: Insufficient documentation

## 2011-08-27 DIAGNOSIS — R011 Cardiac murmur, unspecified: Secondary | ICD-10-CM | POA: Insufficient documentation

## 2011-08-27 DIAGNOSIS — R209 Unspecified disturbances of skin sensation: Secondary | ICD-10-CM | POA: Insufficient documentation

## 2011-08-27 DIAGNOSIS — R42 Dizziness and giddiness: Secondary | ICD-10-CM

## 2011-08-27 DIAGNOSIS — R0989 Other specified symptoms and signs involving the circulatory and respiratory systems: Secondary | ICD-10-CM | POA: Insufficient documentation

## 2011-08-27 DIAGNOSIS — R0600 Dyspnea, unspecified: Secondary | ICD-10-CM

## 2011-08-27 DIAGNOSIS — R071 Chest pain on breathing: Secondary | ICD-10-CM

## 2011-08-27 DIAGNOSIS — J45909 Unspecified asthma, uncomplicated: Secondary | ICD-10-CM | POA: Insufficient documentation

## 2011-08-27 DIAGNOSIS — G4733 Obstructive sleep apnea (adult) (pediatric): Secondary | ICD-10-CM | POA: Insufficient documentation

## 2011-08-27 DIAGNOSIS — R0602 Shortness of breath: Secondary | ICD-10-CM

## 2011-08-27 DIAGNOSIS — R05 Cough: Secondary | ICD-10-CM

## 2011-08-27 LAB — COMPREHENSIVE METABOLIC PANEL
ALT: 12 U/L (ref 0–35)
Albumin: 3.8 g/dL (ref 3.5–5.2)
Alkaline Phosphatase: 68 U/L (ref 39–117)
Calcium: 10 mg/dL (ref 8.4–10.5)
Potassium: 3.8 mEq/L (ref 3.5–5.1)
Sodium: 139 mEq/L (ref 135–145)
Total Protein: 7.9 g/dL (ref 6.0–8.3)

## 2011-08-27 LAB — TROPONIN I: Troponin I: <0.3 ng/mL (ref <0.30)

## 2011-08-27 LAB — CBC
Hemoglobin: 13.3 g/dL (ref 12.0–15.0)
Platelets: 344 10*3/uL (ref 150–400)
RBC: 4.54 MIL/uL (ref 3.87–5.11)
WBC: 10.8 10*3/uL — ABNORMAL HIGH (ref 4.0–10.5)

## 2011-08-27 LAB — URINALYSIS, ROUTINE W REFLEX MICROSCOPIC
Bilirubin Urine: NEGATIVE
Glucose, UA: NEGATIVE mg/dL
Ketones, ur: NEGATIVE mg/dL
Leukocytes, UA: NEGATIVE
Nitrite: NEGATIVE
Specific Gravity, Urine: 1.011 (ref 1.005–1.030)
pH: 6 (ref 5.0–8.0)

## 2011-08-27 LAB — PRO B NATRIURETIC PEPTIDE: Pro B Natriuretic peptide (BNP): 17.2 pg/mL (ref 0–125)

## 2011-08-27 MED ORDER — ALBUTEROL SULFATE (5 MG/ML) 0.5% IN NEBU
5.0000 mg | INHALATION_SOLUTION | Freq: Once | RESPIRATORY_TRACT | Status: AC
  Administered 2011-08-27: 5 mg via RESPIRATORY_TRACT
  Filled 2011-08-27: qty 1

## 2011-08-27 MED ORDER — ASPIRIN 325 MG PO TABS
325.0000 mg | ORAL_TABLET | ORAL | Status: AC
  Administered 2011-08-27: 325 mg via ORAL
  Filled 2011-08-27: qty 1

## 2011-08-27 NOTE — Discharge Instructions (Signed)
Chest Pain (Nonspecific) It is often hard to give a specific diagnosis for the cause of chest pain. There is always a chance that your pain could be related to something serious, such as a heart attack or a blood clot in the lungs. You need to follow up with your caregiver for further evaluation. CAUSES   Heartburn.   Pneumonia or bronchitis.   Anxiety or stress.   Inflammation around your heart (pericarditis) or lung (pleuritis or pleurisy).   A blood clot in the lung.   A collapsed lung (pneumothorax). It can develop suddenly on its own (spontaneous pneumothorax) or from injury (trauma) to the chest.   Shingles infection (herpes zoster virus).  The chest wall is composed of bones, muscles, and cartilage. Any of these can be the source of the pain.  The bones can be bruised by injury.   The muscles or cartilage can be strained by coughing or overwork.   The cartilage can be affected by inflammation and become sore (costochondritis).  DIAGNOSIS  Lab tests or other studies, such as X-rays, electrocardiography, stress testing, or cardiac imaging, may be needed to find the cause of your pain.  TREATMENT   Treatment depends on what may be causing your chest pain. Treatment may include:   Acid blockers for heartburn.   Anti-inflammatory medicine.   Pain medicine for inflammatory conditions.   Antibiotics if an infection is present.   You may be advised to change lifestyle habits. This includes stopping smoking and avoiding alcohol, caffeine, and chocolate.   You may be advised to keep your head raised (elevated) when sleeping. This reduces the chance of acid going backward from your stomach into your esophagus.   Most of the time, nonspecific chest pain will improve within 2 to 3 days with rest and mild pain medicine.  HOME CARE INSTRUCTIONS   If antibiotics were prescribed, take your antibiotics as directed. Finish them even if you start to feel better.   For the next few  days, avoid physical activities that bring on chest pain. Continue physical activities as directed.   Do not smoke.   Avoid drinking alcohol.   Only take over-the-counter or prescription medicine for pain, discomfort, or fever as directed by your caregiver.   Follow your caregiver's suggestions for further testing if your chest pain does not go away.   Keep any follow-up appointments you made. If you do not go to an appointment, you could develop lasting (chronic) problems with pain. If there is any problem keeping an appointment, you must call to reschedule.  SEEK MEDICAL CARE IF:   You think you are having problems from the medicine you are taking. Read your medicine instructions carefully.   Your chest pain does not go away, even after treatment.   You develop a rash with blisters on your chest.  SEEK IMMEDIATE MEDICAL CARE IF:   You have increased chest pain or pain that spreads to your arm, neck, jaw, back, or abdomen.   You develop shortness of breath, an increasing cough, or you are coughing up blood.   You have severe back or abdominal pain, feel nauseous, or vomit.   You develop severe weakness, fainting, or chills.   You have a fever.  THIS IS AN EMERGENCY. Do not wait to see if the pain will go away. Get medical help at once. Call your local emergency services (911 in [place] Do not drive yourself to the hospital. MAKE SURE YOU:   Understand these instructions.     Will watch your condition.   Will get help right away if you are not doing well or get worse.  Document Released: 02/05/2005 Document Revised: 04/17/2011 Document Reviewed: 12/02/2007 [employer] Patient Information 2012 [employer]. 

## 2011-08-27 NOTE — ED Provider Notes (Signed)
History     CSN: [id number]  Arrival date & time 08/27/11  1406   First MD Initiated Contact with Patient 08/27/11 1530      Chief Complaint  Patient presents with  . Shortness of Breath    (Consider location/radiation/quality/duration/timing/severity/associated sxs/prior treatment) Patient is a 36 y.o. female presenting with shortness of breath. The history is provided by the patient. No language interpreter was used.  Shortness of Breath  The current episode started today. The problem occurs continuously. The problem has been gradually improving. The problem is severe. The symptoms are relieved by nothing. The symptoms are aggravated by nothing. Associated symptoms include chest pain and shortness of breath. There was no intake of a foreign body. The Heimlich maneuver was not attempted. She has not inhaled smoke recently. She has had no prior hospitalizations. She has had no prior ICU admissions. She has had no prior intubations. Her past medical history is significant for asthma. Urine output has been normal. There were no sick contacts.  Pt reports she was at work and had pain in her chest and shortness of breath.  Pt reports she frequently has tightness in her chest when she has asthma attacks.  Pt reports the episode today scared her.  Pt reports her finger and toes began tingling and she felt like she passed out.  Pt reports EMS told her that she was hyperventilating.   Past Medical History  Diagnosis Date  . Asthma     history of  . Murmur, cardiac   . Retinal hole or tear     left eye- 1996 due to injury  . Obstructive sleep apnea on CPAP   . Morbid obesity   . Arrhythmia     Past Surgical History  Procedure Date  . Cesarean section 4/04  . Hernia repair 04/2009    abdominal hernia  . Wisdom tooth extraction 1999  . Eye surgery 11/2010    lasar repair of retinal tear  . Tubal ligation     Family History  Problem Relation Age of Onset  . Hypertension Mother   .  Diabetes Mother     type II  . Other Mother     cervical dysplasia  . Hypertension Father   . Bipolar disorder Father   . Heart disease Maternal Grandmother   . Hypertension Maternal Grandmother   . Bipolar disorder Maternal Grandmother   . Hypertension Maternal Grandfather   . Hypertension Paternal Grandmother   . Hypertension Paternal Grandfather     History  Substance Use Topics  . Smoking status: Never Smoker   . Smokeless tobacco: Never Used  . Alcohol Use: No    OB History    Grav Para Term Preterm Abortions TAB SAB Ect Mult Living                  Review of Systems  Respiratory: Positive for shortness of breath.   Cardiovascular: Positive for chest pain.  All other systems reviewed and are negative.    Allergies  Advair hfa; Latex; and Spinach  Home Medications   Current Outpatient Rx  Name Route Sig Dispense Refill  . ALBUTEROL SULFATE HFA 108 (90 BASE) MCG/ACT IN AERS Inhalation Inhale 2 puffs into the lungs every 4 (four) hours as needed. For shortness of breath or wheezing    . BUDESONIDE-FORMOTEROL FUMARATE 160-4.5 MCG/ACT IN AERO Inhalation Inhale 2 puffs into the lungs 2 (two) times daily. 1 Inhaler 5  . DIAZEPAM 5 MG PO TABS Oral  Take 5 mg by mouth every 6 (six) hours as needed.    . IBUPROFEN 200 MG PO TABS Oral Take 400 mg by mouth once as needed. For pain    . OXYCODONE-ACETAMINOPHEN 5-325 MG PO TABS Oral Take 1 tablet by mouth every 4 (four) hours as needed.    . ALBUTEROL SULFATE (2.5 MG/3ML) 0.083% IN NEBU Nebulization Take 2.5 mg by nebulization every 6 (six) hours as needed. For shortness of breath or wheezing    . ADULT MULTIVITAMIN W/MINERALS CH Oral Take 1 tablet by mouth daily.      BP 140/69  Pulse 83  Temp(Src) 97.7 F (36.5 C) (Oral)  Resp 18  Wt 390 lb 4 oz (177.016 kg)  SpO2 96%  LMP 08/18/2011  Physical Exam  Nursing note and vitals reviewed. Constitutional: She is oriented to person, place, and time. She appears  well-developed and well-nourished.  HENT:  Head: Normocephalic and atraumatic.  Eyes: Conjunctivae and EOM are normal. Pupils are equal, round, and reactive to light.  Neck: Normal range of motion. Neck supple.  Cardiovascular: Normal rate and normal heart sounds.   Pulmonary/Chest: Effort normal and breath sounds normal.  Abdominal: Soft. Bowel sounds are normal.  Musculoskeletal: Normal range of motion.  Neurological: She is alert and oriented to person, place, and time.  Skin: Skin is warm.  Psychiatric: She has a normal mood and affect.    ED Course  Procedures (including critical care time)  Labs Reviewed  CBC - Abnormal; Notable for the following:    WBC 10.8 (*)    All other components within normal limits  COMPREHENSIVE METABOLIC PANEL - Abnormal; Notable for the following:    Total Bilirubin 0.2 (*)    All other components within normal limits  PRO B NATRIURETIC PEPTIDE  TROPONIN I  D-DIMER, QUANTITATIVE  URINALYSIS, ROUTINE W REFLEX MICROSCOPIC   Dg Chest 2 View  08/27/2011  *RADIOLOGY REPORT*  Clinical Data: Cough, dizziness, shortness of breath.  CHEST - 2 VIEW  Comparison: 09/01/2010  Findings: Mild peribronchial thickening. Heart and mediastinal contours are within normal limits.  No focal opacities or effusions.  No acute bony abnormality.  IMPRESSION: Bronchitic changes.  Original Report Authenticated By: Cyndie Chime, M.D.     No diagnosis found.  Results for orders placed during the hospital encounter of 08/27/11  CBC      Component Value Range   WBC 10.8 (*) 4.0 - 10.5 (K/uL)   RBC 4.54  3.87 - 5.11 (MIL/uL)   Hemoglobin 13.3  12.0 - 15.0 (g/dL)   HCT 16.1  09.6 - 04.5 (%)   MCV 86.6  78.0 - 100.0 (fL)   MCH 29.3  26.0 - 34.0 (pg)   MCHC 33.8  30.0 - 36.0 (g/dL)   RDW 40.9  81.1 - 91.4 (%)   Platelets 344  150 - 400 (K/uL)  PRO B NATRIURETIC PEPTIDE      Component Value Range   Pro B Natriuretic peptide (BNP) 17.2  0 - 125 (pg/mL)  TROPONIN I       Component Value Range   Troponin I <0.30  <0.30 (ng/mL)  D-DIMER, QUANTITATIVE      Component Value Range   D-Dimer, Quant 0.28  0.00 - 0.48 (ug/mL-FEU)  COMPREHENSIVE METABOLIC PANEL      Component Value Range   Sodium 139  135 - 145 (mEq/L)   Potassium 3.8  3.5 - 5.1 (mEq/L)   Chloride 102  96 - 112 (mEq/L)  CO2 26  19 - 32 (mEq/L)   Glucose, Bld 86  70 - 99 (mg/dL)   BUN 14  6 - 23 (mg/dL)   Creatinine, Ser 3.08  0.50 - 1.10 (mg/dL)   Calcium 65.7  8.4 - 10.5 (mg/dL)   Total Protein 7.9  6.0 - 8.3 (g/dL)   Albumin 3.8  3.5 - 5.2 (g/dL)   AST 14  0 - 37 (U/L)   ALT 12  0 - 35 (U/L)   Alkaline Phosphatase 68  39 - 117 (U/L)   Total Bilirubin 0.2 (*) 0.3 - 1.2 (mg/dL)   GFR calc non Af Amer >90  >90 (mL/min)   GFR calc Af Amer >90  >90 (mL/min)  URINALYSIS, ROUTINE W REFLEX MICROSCOPIC      Component Value Range   Color, Urine YELLOW  YELLOW    APPearance CLEAR  CLEAR    Specific Gravity, Urine 1.011  1.005 - 1.030    pH 6.0  5.0 - 8.0    Glucose, UA NEGATIVE  NEGATIVE (mg/dL)   Hgb urine dipstick NEGATIVE  NEGATIVE    Bilirubin Urine NEGATIVE  NEGATIVE    Ketones, ur NEGATIVE  NEGATIVE (mg/dL)   Protein, ur NEGATIVE  NEGATIVE (mg/dL)   Urobilinogen, UA 0.2  0.0 - 1.0 (mg/dL)   Nitrite NEGATIVE  NEGATIVE    Leukocytes, UA NEGATIVE  NEGATIVE   TROPONIN I      Component Value Range   Troponin I <0.30  <0.30 (ng/mL)   Dg Chest 2 View  08/27/2011  *RADIOLOGY REPORT*  Clinical Data: Cough, dizziness, shortness of breath.  CHEST - 2 VIEW  Comparison: 09/01/2010  Findings: Mild peribronchial thickening. Heart and mediastinal contours are within normal limits.  No focal opacities or effusions.  No acute bony abnormality.  IMPRESSION: Bronchitic changes.  Original Report Authenticated By: Cyndie Chime, M.D.     MDM   Date: 08/27/2011  Rate: 73  Rhythm: normal sinus rhythm  QRS Axis: normal  Intervals: normal  ST/T Wave abnormalities: normal  Conduction  Disutrbances:none  Narrative Interpretation:   Old EKG Reviewed: unchanged    Pt given albuterol neb,  Labs obtained dDimer is normal,  Troponin negative x 2.  I thinks pt's pain is pleuritic.  Shortness of breath may be due to pt's asthma  Probably along with hyperventilation.   I advised pt to continue her inhaler.  Pt advised to follow up with her MD for recheck.  Hydro done for pain     Lonia Skinner [place], [place] 08/27/11 2100

## 2011-08-27 NOTE — ED Notes (Signed)
Pt states she has to void upon arrival.  Pt ambulatory to restroom without assistance.  No distress noted.  Pt continues to speak full sentences after ambulation.

## 2011-08-27 NOTE — ED Notes (Signed)
[facility] reports pt was c/o shortness of breath, used inhaler, lung sounds clear. Pt speaking full sentences.

## 2011-08-27 NOTE — ED Notes (Signed)
MD at bedside. 

## 2011-08-28 ENCOUNTER — Ambulatory Visit (INDEPENDENT_AMBULATORY_CARE_PROVIDER_SITE_OTHER): Payer: Managed Care, Other (non HMO) | Admitting: Internal Medicine

## 2011-08-28 ENCOUNTER — Telehealth: Payer: Self-pay | Admitting: *Deleted

## 2011-08-28 ENCOUNTER — Other Ambulatory Visit (HOSPITAL_BASED_OUTPATIENT_CLINIC_OR_DEPARTMENT_OTHER): Payer: Managed Care, Other (non HMO)

## 2011-08-28 ENCOUNTER — Encounter: Payer: Self-pay | Admitting: Internal Medicine

## 2011-08-28 VITALS — BP 134/84 | HR 98 | Temp 98.3°F | Wt 390.0 lb

## 2011-08-28 DIAGNOSIS — M79605 Pain in left leg: Secondary | ICD-10-CM

## 2011-08-28 DIAGNOSIS — J45909 Unspecified asthma, uncomplicated: Secondary | ICD-10-CM

## 2011-08-28 DIAGNOSIS — M79609 Pain in unspecified limb: Secondary | ICD-10-CM

## 2011-08-28 MED ORDER — METHYLPREDNISOLONE 4 MG PO KIT: PACK | ORAL | Status: AC

## 2011-08-28 MED ORDER — AZITHROMYCIN 250 MG PO TABS: ORAL_TABLET | ORAL | Status: AC

## 2011-08-28 NOTE — Telephone Encounter (Signed)
Received message from [place] in imaging stating pt did not show for her 4pm ultrasound of her leg today. They have left message on her home and cell voicemail to call and r/s. Left message on cell# for pt to return my call.

## 2011-08-28 NOTE — ED Provider Notes (Signed)
Medical screening examination/treatment/procedure(s) were performed by non-physician practitioner and as supervising physician I was immediately available for consultation/collaboration.   Abelina Ketron A Suhailah Kwan, MD 08/28/11 0011 

## 2011-08-29 ENCOUNTER — Ambulatory Visit (HOSPITAL_BASED_OUTPATIENT_CLINIC_OR_DEPARTMENT_OTHER)
Admission: RE | Admit: 2011-08-29 | Discharge: 2011-08-29 | Disposition: A | Discharge status: Home / Self Care | Payer: Managed Care, Other (non HMO) | Source: Ambulatory Visit | Attending: Internal Medicine | Admitting: Internal Medicine

## 2011-08-29 DIAGNOSIS — M79605 Pain in left leg: Secondary | ICD-10-CM

## 2011-08-29 DIAGNOSIS — M79609 Pain in unspecified limb: Secondary | ICD-10-CM

## 2011-08-29 DIAGNOSIS — M7989 Other specified soft tissue disorders: Secondary | ICD-10-CM

## 2011-08-31 DIAGNOSIS — M79605 Pain in left leg: Secondary | ICD-10-CM | POA: Insufficient documentation

## 2011-08-31 NOTE — Assessment & Plan Note (Signed)
Attempt zpak and steroid taper. Continue albuterol mdi prn. Followup if no improvement or worsening.

## 2011-08-31 NOTE — Progress Notes (Signed)
  Subjective:    Patient ID: Amber Salas, female    DOB: Aug 26, 1975, 36 y.o.   MRN: [id number]  HPI Pt presents to clinic for evaluation of multiple medical complaints. Yesterday developed chest tightness and dyspnea. Was presyncopal and reportedly found by EMS hyperventilating. Underwent ED evaluation with CXR suggesting peribronchial thickening. EKG, cardiac enzymes and d-dimer unrevealing. Given albuterol neb with improvement. Notes mild dyspnea helped by albuterol mdi. Does have h/o asthma. No recent f/c or cough. Notes chronic left leg swelling recently worsened. No h/o DVT. No alleviating or exacerbating factors.   Past Medical History  Diagnosis Date  . Asthma     history of  . Murmur, cardiac   . Retinal hole or tear     left eye- 1996 due to injury  . Obstructive sleep apnea on CPAP   . Morbid obesity   . Arrhythmia    Past Surgical History  Procedure Date  . Cesarean section 4/04  . Hernia repair 04/2009    abdominal hernia  . Wisdom tooth extraction 1999  . Eye surgery 11/2010    lasar repair of retinal tear  . Tubal ligation     reports that she has never smoked. She has never used smokeless tobacco. She reports that she does not drink alcohol or use illicit drugs. family history includes Bipolar disorder in her father and maternal grandmother; Diabetes in her mother; Heart disease in her maternal grandmother; Hypertension in her father, maternal grandfather, maternal grandmother, mother, paternal grandfather, and paternal grandmother; and Other in her mother. Allergies  Allergen Reactions  . Advair Hfa Palpitations and Other (See Comments)    chest pain  . Latex Rash and Other (See Comments)    wheezing  . Spinach Rash     Review of Systems see hpi     Objective:   Physical Exam  Nursing note and vitals reviewed. Constitutional: She appears well-developed and well-nourished. No distress.  HENT:  Head: Normocephalic and atraumatic.  Eyes: Conjunctivae  are normal. No scleral icterus.  Cardiovascular: Normal rate, regular rhythm and normal heart sounds.   Pulmonary/Chest: Effort normal and breath sounds normal. No respiratory distress. She has no wheezes. She has no rales.  Neurological: She is alert.  Skin: Skin is warm and dry. She is not diaphoretic. No erythema.       LLE swelling. No palpable cords. Gait nl  Psychiatric: She has a normal mood and affect.          Assessment & Plan:

## 2011-08-31 NOTE — Assessment & Plan Note (Signed)
Obtain LE [place] r/o DVT

## 2011-09-01 NOTE — Telephone Encounter (Signed)
Had it done. Neg and notified

## 2011-09-01 NOTE — Telephone Encounter (Signed)
Please see below message.

## 2011-09-11 ENCOUNTER — Encounter: Payer: Self-pay | Admitting: Internal Medicine

## 2011-09-11 ENCOUNTER — Ambulatory Visit (INDEPENDENT_AMBULATORY_CARE_PROVIDER_SITE_OTHER): Payer: Managed Care, Other (non HMO) | Admitting: Internal Medicine

## 2011-09-11 VITALS — BP 130/82 | HR 92 | Temp 98.1°F | Resp 20 | Ht 66.0 in | Wt 395.0 lb

## 2011-09-11 DIAGNOSIS — J45909 Unspecified asthma, uncomplicated: Secondary | ICD-10-CM

## 2011-09-11 MED ORDER — METHYLPREDNISOLONE ACETATE 40 MG/ML IJ SUSP
40.0000 mg | Freq: Once | INTRAMUSCULAR | Status: AC
  Administered 2011-09-11: 40 mg via INTRAMUSCULAR

## 2011-09-11 MED ORDER — METHYLPREDNISOLONE 4 MG PO KIT: PACK | ORAL | Status: AC

## 2011-09-11 NOTE — Progress Notes (Signed)
Addended by: Glendell Docker on: 09/11/2011 02:02 PM   Modules accepted: Orders

## 2011-09-11 NOTE — Progress Notes (Signed)
  Subjective:    Patient ID: Amber Salas, female    DOB: July 02, 1975, 36 y.o.   MRN: [id number]  HPI Pt presents to clinic for evaluation of wheezing. Known h/o asthma and notes one day h/o wheezing without obvious trigger. Compliant with medications. No recent infxn. Using albuterol mdi and nebs prn. Requests note for work in support of her receiving an air filter to decrease potential allergen exposure. No alleviating or exacerbating factors.  Past Medical History  Diagnosis Date  . Asthma     history of  . Murmur, cardiac   . Retinal hole or tear     left eye- 1996 due to injury  . Obstructive sleep apnea on CPAP   . Morbid obesity   . Arrhythmia    Past Surgical History  Procedure Date  . Cesarean section 4/04  . Hernia repair 04/2009    abdominal hernia  . Wisdom tooth extraction 1999  . Eye surgery 11/2010    lasar repair of retinal tear  . Tubal ligation     reports that she has never smoked. She has never used smokeless tobacco. She reports that she does not drink alcohol or use illicit drugs. family history includes Bipolar disorder in her father and maternal grandmother; Diabetes in her mother; Heart disease in her maternal grandmother; Hypertension in her father, maternal grandfather, maternal grandmother, mother, paternal grandfather, and paternal grandmother; and Other in her mother. Allergies  Allergen Reactions  . Fluticasone-Salmeterol Palpitations and Other (See Comments)    chest pain  . Latex Rash and Other (See Comments)    wheezing  . Spinach Rash     Review of Systems see hpi     Objective:   Physical Exam  Nursing note and vitals reviewed. Constitutional: She appears well-developed and well-nourished. No distress.  HENT:  Head: Normocephalic and atraumatic.  Eyes: Conjunctivae are normal. No scleral icterus.  Cardiovascular: Normal rate, regular rhythm and normal heart sounds.   Pulmonary/Chest: Effort normal and breath sounds normal. No  respiratory distress. She has no wheezes. She has no rales.  Neurological: She is alert.  Skin: She is not diaphoretic.  Psychiatric: She has a normal mood and affect.          Assessment & Plan:

## 2011-09-11 NOTE — Assessment & Plan Note (Signed)
Current mild flare. Consider possible allergic trigger. Given depomedrol im in clinic and begin medrol dosepak. Followup if no improvement or worsening. Letter provided in support of air filter at work ?[employer]

## 2011-10-09 ENCOUNTER — Encounter: Payer: Self-pay | Admitting: Internal Medicine

## 2011-10-09 ENCOUNTER — Ambulatory Visit (INDEPENDENT_AMBULATORY_CARE_PROVIDER_SITE_OTHER): Payer: Managed Care, Other (non HMO) | Admitting: Internal Medicine

## 2011-10-09 VITALS — BP 124/80 | HR 79 | Temp 98.0°F | Resp 20 | Ht 66.0 in | Wt 389.0 lb

## 2011-10-09 DIAGNOSIS — K5289 Other specified noninfective gastroenteritis and colitis: Secondary | ICD-10-CM

## 2011-10-09 DIAGNOSIS — J45909 Unspecified asthma, uncomplicated: Secondary | ICD-10-CM

## 2011-10-09 DIAGNOSIS — K529 Noninfective gastroenteritis and colitis, unspecified: Secondary | ICD-10-CM

## 2011-10-09 MED ORDER — ONDANSETRON HCL 8 MG PO TABS: 8.0000 mg | ORAL_TABLET | Freq: Three times a day (TID) | ORAL | Status: DC | PRN

## 2011-10-12 DIAGNOSIS — K529 Noninfective gastroenteritis and colitis, unspecified: Secondary | ICD-10-CM | POA: Insufficient documentation

## 2011-10-12 NOTE — Progress Notes (Signed)
  Subjective:    Patient ID: Amber Salas, female    DOB: [birth year], 36 y.o.   MRN: [id number]  HPI Pt presents to clinic for followup of multiple medical problems. States breathing overall better since last visit but continues to have intermittent asthma exacerbations. States her employer has concerns and mentioned taking a possible leave to work on improved control. Has fmla paperwork in place. 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 No f/c. Improved today.  Past Medical History  Diagnosis Date  . Asthma     history of  . Murmur, cardiac   . Retinal hole or tear     left eye- 1996 due to injury  . Obstructive sleep apnea on CPAP   . Morbid obesity   . Arrhythmia    Past Surgical History  Procedure Date  . Cesarean section 4/04  . Hernia repair 04/2009    abdominal hernia  . Wisdom tooth extraction 1999  . Eye surgery 11/2010    lasar repair of retinal tear  . Tubal ligation     reports that she has never smoked. She has never used smokeless tobacco. She reports that she does not drink alcohol or use illicit drugs. family history includes Bipolar disorder in her father and maternal grandmother; Diabetes in her mother; Heart disease in her maternal grandmother; Hypertension in her father, maternal grandfather, maternal grandmother, mother, paternal grandfather, and paternal grandmother; and Other in her mother. Allergies  Allergen Reactions  . Fluticasone-Salmeterol Palpitations and Other (See Comments)    chest pain  . Latex Rash and Other (See Comments)    wheezing  . Spinach Rash      Review of Systems see hpi     Objective:   Physical Exam  Nursing note and vitals reviewed. Constitutional: She appears well-developed and well-nourished. No distress.  HENT:  Head: Normocephalic and atraumatic.  Eyes: Conjunctivae are normal. No scleral icterus.  Abdominal: Soft. Bowel sounds are normal. She exhibits no distension and no mass. There is no  tenderness. There is no rebound and no guarding.  Neurological: She is alert.  Skin: Skin is warm. She is not diaphoretic.  Psychiatric: She has a normal mood and affect.          Assessment & Plan:

## 2011-10-12 NOTE — Assessment & Plan Note (Signed)
Suspect viral etiology. Provide zofran prn n/v. Followup if no improvement or worsening.

## 2011-10-12 NOTE — Assessment & Plan Note (Signed)
Pulmonary consult

## 2011-10-14 ENCOUNTER — Ambulatory Visit (INDEPENDENT_AMBULATORY_CARE_PROVIDER_SITE_OTHER): Payer: Managed Care, Other (non HMO) | Admitting: Family

## 2011-10-14 ENCOUNTER — Encounter: Payer: Self-pay | Admitting: Family

## 2011-10-14 ENCOUNTER — Encounter (HOSPITAL_BASED_OUTPATIENT_CLINIC_OR_DEPARTMENT_OTHER): Payer: Self-pay | Admitting: *Deleted

## 2011-10-14 ENCOUNTER — Emergency Department (HOSPITAL_BASED_OUTPATIENT_CLINIC_OR_DEPARTMENT_OTHER)
Admission: EM | Admit: 2011-10-14 | Discharge: 2011-10-14 | Disposition: A | Discharge status: Home / Self Care | Payer: Managed Care, Other (non HMO) | Attending: Emergency Medicine | Admitting: Emergency Medicine

## 2011-10-14 VITALS — BP 122/70 | HR 105 | Temp 97.6°F | Resp 20

## 2011-10-14 DIAGNOSIS — J45901 Unspecified asthma with (acute) exacerbation: Secondary | ICD-10-CM

## 2011-10-14 DIAGNOSIS — G4733 Obstructive sleep apnea (adult) (pediatric): Secondary | ICD-10-CM | POA: Insufficient documentation

## 2011-10-14 MED ORDER — PREDNISONE 10 MG PO TABS: 20.0000 mg | ORAL_TABLET | Freq: Every day | ORAL | Status: DC

## 2011-10-14 MED ORDER — ACETAMINOPHEN 325 MG PO TABS
650.0000 mg | ORAL_TABLET | Freq: Once | ORAL | Status: AC
  Administered 2011-10-14: 650 mg via ORAL
  Filled 2011-10-14: qty 2

## 2011-10-14 MED ORDER — PREDNISONE 10 MG PO TABS: 10.0000 mg | ORAL_TABLET | Freq: Every day | ORAL | Status: DC

## 2011-10-14 NOTE — Progress Notes (Signed)
Subjective:    Patient ID: Amber Salas, female    DOB: 1975-11-06, 36 y.o.   MRN: [id number]  HPI  [name] is a 36 yr old female who presents today with chief complaint of asthma.  She reports that her asthma has been "flaring up."  She is using albuterol without improvement.  Using albuterol without improvement.  She fell asleep without CPAP last night. She continues the symbicort.  She reports feeling short of breath even at rest.  Shortness of breath is worsened by ambulation.   Review of Systems See HPI    Past Medical History  Diagnosis Date  . Asthma     history of  . Murmur, cardiac   . Retinal hole or tear     left eye- 1996 due to injury  . Obstructive sleep apnea on CPAP   . Morbid obesity   . Arrhythmia     History   Social History  . Marital Status: Married    Spouse Name: N/A    Number of Children: N/A  . Years of Education: N/A   Occupational History  . Not on file.   Social History Main Topics  . Smoking status: Never Smoker   . Smokeless tobacco: Never Used  . Alcohol Use: No  . Drug Use: No  . Sexually Active: Yes    Birth Control/ Protection: Surgical   Other Topics Concern  . 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    Past Surgical History  Procedure Date  . Cesarean section 4/04  . Hernia repair 04/2009    abdominal hernia  . Wisdom tooth extraction 1999  . Eye surgery 11/2010    lasar repair of retinal tear  . Tubal ligation     Family History  Problem Relation Age of Onset  . Hypertension Mother   . Diabetes Mother     type II  . Other Mother     cervical dysplasia  . Hypertension Father   . Bipolar disorder Father   . Heart disease Maternal Grandmother   . Hypertension Maternal Grandmother   . Bipolar disorder Maternal Grandmother   . Hypertension Maternal Grandfather   . Hypertension Paternal Grandmother    . Hypertension Paternal Grandfather     Allergies  Allergen Reactions  . Fluticasone-Salmeterol Palpitations and Other (See Comments)    chest pain  . Latex Rash and Other (See Comments)    wheezing  . Spinach Rash    Current Outpatient Prescriptions on File Prior to Visit  Medication Sig Dispense Refill  . albuterol (PROVENTIL HFA;VENTOLIN HFA) 108 (90 BASE) MCG/ACT inhaler Inhale 2 puffs into the lungs every 4 (four) hours as needed. For shortness of breath or wheezing      . albuterol (PROVENTIL) (2.5 MG/3ML) 0.083% nebulizer solution Take 2.5 mg by nebulization every 6 (six) hours as needed. For shortness of breath or wheezing      . budesonide-formoterol (SYMBICORT) 160-4.5 MCG/ACT inhaler Inhale 2 puffs into the lungs 2 (two) times daily.  1 Inhaler  5  . cetirizine (ZYRTEC) 10 MG tablet Take 10 mg by mouth daily.      [name] ibuprofen (ADVIL,MOTRIN) 200 MG tablet Take 400 mg by mouth once as needed. For pain      . Multiple Vitamin (MULITIVITAMIN WITH MINERALS) TABS Take 1 tablet by mouth daily.  BP 122/70  Pulse 105  Temp(Src) 97.6 F (36.4 C) (Oral)  Resp 20  SpO2 98%  LMP 10/07/2011    Objective:   Physical Exam  Constitutional: She appears well-developed and well-nourished.  HENT:  Head: Normocephalic and atraumatic.  Cardiovascular: Normal rate and regular rhythm.   No murmur heard. Pulmonary/Chest: No accessory muscle usage. She is in respiratory distress. She has no decreased breath sounds. She has wheezes. She has no rhonchi. She has no rales.       Mild respiratory distress is noted. Conversational dyspnea.  Fair air movement to bases.  Soft expiratory wheeze.           Assessment & Plan:

## 2011-10-14 NOTE — ED Notes (Signed)
Pt sent from PMD for eval of asthma , pt c/o sob

## 2011-10-14 NOTE — ED Notes (Signed)
MD at bedside. 

## 2011-10-14 NOTE — Discharge Instructions (Signed)
Asthma, Adult  Asthma is a disease of the lungs and can make it hard to breathe. Asthma cannot be cured, but medicine can help control it. Asthma may be started (triggered) by:   Pollen.   Dust.   Animal skin flakes (dander).   Molds.   Foods.   Respiratory infections (colds, flu).   Smoke.   Exercise.   Stress.   Other things that cause allergic reactions or allergies (allergens).  HOME CARE    Talk to your doctor about how to manage your attacks at home. This may include:   Using a tool called a peak flow meter.   Having medicine ready to stop the attack.   Take all medicine as told by your doctor.   Wash bed sheets and blankets every week in hot water and put them in the dryer.   Drink enough fluids to keep your pee (urine) clear or pale yellow.   Always be ready to get emergency help. Write down the phone number for your doctor. Keep it where you can easily find it.   Talk about exercise routines with your doctor.   If animal dander is causing your asthma, you may need to find a new home for your pet(s).  GET HELP RIGHT AWAY IF:    You have muscle aches.   You cough more.   You have chest pain.   You have thick spit (sputum) that changes to yellow, green, gray, or bloody.   Medicine does not stop your wheezing.   You have problems breathing.   You have a fever.   Your medicine causes:   A rash.   Itching.   Puffiness (swelling).   Breathing problems.  MAKE SURE YOU:    Understand these instructions.   Will watch your condition.   Will get help right away if you are not doing well or get worse.  Document Released: 10/15/2007 Document Revised: 04/17/2011 Document Reviewed: 03/08/2008  [employer] Patient Information 2012 [employer].

## 2011-10-14 NOTE — ED Provider Notes (Signed)
History     CSN: [id number]  Arrival date & time 10/14/11  1704   First MD Initiated Contact with Patient 10/14/11 1713      Chief Complaint  Patient presents with  . Shortness of Breath    HPI Patient presents with asthma exacerbation.  Patient with long history of asthma went to her private doctor with shortness of breath.  Was given Solu-Medrol I am an albuterol treatment when she had one episode of vomiting.  Was sent here for further evaluation.  By the time the patient arrived in emergency department her asthma was much improved she was speaking in full sentences and had a slight headache otherwise was back to baseline. Past Medical History  Diagnosis Date  . Asthma     history of  . Murmur, cardiac   . Retinal hole or tear     left eye- 1996 due to injury  . Obstructive sleep apnea on CPAP   . Morbid obesity   . Arrhythmia     Past Surgical History  Procedure Date  . Cesarean section 4/04  . Hernia repair 04/2009    abdominal hernia  . Wisdom tooth extraction 1999  . Eye surgery 11/2010    lasar repair of retinal tear  . Tubal ligation     Family History  Problem Relation Age of Onset  . Hypertension Mother   . Diabetes Mother     type II  . Other Mother     cervical dysplasia  . Hypertension Father   . Bipolar disorder Father   . Heart disease Maternal Grandmother   . Hypertension Maternal Grandmother   . Bipolar disorder Maternal Grandmother   . Hypertension Maternal Grandfather   . Hypertension Paternal Grandmother   . Hypertension Paternal Grandfather     History  Substance Use Topics  . Smoking status: Never Smoker   . Smokeless tobacco: Never Used  . Alcohol Use: No    OB History    Grav Para Term Preterm Abortions TAB SAB Ect Mult Living                  Review of Systems  Cardiovascular: Positive for chest pain.  All other systems reviewed and are negative.    Allergies  Fluticasone-salmeterol; Latex; and Spinach  Home  Medications   Current Outpatient Rx  Name Route Sig Dispense Refill  . ALBUTEROL SULFATE HFA 108 (90 BASE) MCG/ACT IN AERS Inhalation Inhale 2 puffs into the lungs every 4 (four) hours as needed. For shortness of breath or wheezing    . ALBUTEROL SULFATE (2.5 MG/3ML) 0.083% IN NEBU Nebulization Take 2.5 mg by nebulization every 6 (six) hours as needed. For shortness of breath or wheezing    . BUDESONIDE-FORMOTEROL FUMARATE 160-4.5 MCG/ACT IN AERO Inhalation Inhale 2 puffs into the lungs 2 (two) times daily. 1 Inhaler 5  . CETIRIZINE HCL 10 MG PO TABS Oral Take 10 mg by mouth daily.    . IBUPROFEN 200 MG PO TABS Oral Take 400 mg by mouth once as needed. For pain    . ADULT MULTIVITAMIN W/MINERALS CH Oral Take 1 tablet by mouth daily.    [name] PREDNISONE 10 MG PO TABS Oral Take 2 tablets (20 mg total) by mouth daily. 15 tablet 0    BP 134/80  Pulse 76  Temp(Src) 98.2 F (36.8 C) (Oral)  Resp 16  Ht 5\' 6"  (1.676 m)  Wt 380 lb (172.367 kg)  BMI 61.33 kg/m2  SpO2 100%  LMP 10/07/2011  Physical Exam  Nursing note and vitals reviewed. Constitutional: She is oriented to person, place, and time. She appears well-developed and well-nourished. No distress.  HENT:  Head: Normocephalic and atraumatic.  Eyes: Pupils are equal, round, and reactive to light.  Neck: Normal range of motion.  Cardiovascular: Normal rate and intact distal pulses.        Normal sinus rhythm Ventricular rate = 78 QRS = 90 QTC = 410 Nonspecific ST-T abnormalities which are unchanged from previous EKG of April 2013 Impression: Abnormal ECG  Pulmonary/Chest: No respiratory distress. She has no wheezes. She has no rales.  Abdominal: Normal appearance. She exhibits no distension.  Musculoskeletal: Normal range of motion.  Neurological: She is alert and oriented to person, place, and time. No cranial nerve deficit.  Skin: Skin is warm and dry. No rash noted.  Psychiatric: She has a normal mood and affect. Her behavior is  normal.    ED Course  Procedures (including critical care time) Scheduled Meds:    . acetaminophen  650 mg Oral Once   Continuous Infusions:  PRN Meds:.     ,Labs Reviewed - No data to display No results found.   1. Acute asthma exacerbation       MDM         Nelia Shi, MD 10/14/11 Rickey Primus

## 2011-10-14 NOTE — Assessment & Plan Note (Signed)
Pt was given IM solumedrol here in the office as well as an albuterol nebulizer treatment.  She then vomited and became diaphoretic with increasing shortness of breath.  She will be escorted down via wheelchair to the ED for further evaluation.  I think she needs an ABG at this point.  Report was given to the charge nurse in the ED.

## 2011-10-14 NOTE — Patient Instructions (Addendum)
Please go directly to the ER.

## 2011-10-15 MED ORDER — METHYLPREDNISOLONE SODIUM SUCC 125 MG IJ SOLR
125.0000 mg | Freq: Once | INTRAMUSCULAR | Status: AC
  Administered 2011-10-14: 125 mg via INTRAMUSCULAR

## 2011-10-15 MED ORDER — ALBUTEROL SULFATE (2.5 MG/3ML) 0.083% IN NEBU
2.5000 mg | INHALATION_SOLUTION | Freq: Once | RESPIRATORY_TRACT | Status: AC
  Administered 2011-10-14: 2.5 mg via RESPIRATORY_TRACT

## 2011-10-21 ENCOUNTER — Encounter: Payer: Self-pay | Admitting: Family

## 2011-10-21 ENCOUNTER — Ambulatory Visit (INDEPENDENT_AMBULATORY_CARE_PROVIDER_SITE_OTHER): Payer: Managed Care, Other (non HMO) | Admitting: Family

## 2011-10-21 VITALS — BP 122/80 | HR 94 | Temp 97.9°F | Resp 18 | Ht 65.98 in | Wt 391.0 lb

## 2011-10-21 DIAGNOSIS — J45901 Unspecified asthma with (acute) exacerbation: Secondary | ICD-10-CM

## 2011-10-21 DIAGNOSIS — G4730 Sleep apnea, unspecified: Secondary | ICD-10-CM

## 2011-10-21 MED ORDER — ALBUTEROL SULFATE (2.5 MG/3ML) 0.083% IN NEBU: 2.5000 mg | INHALATION_SOLUTION | RESPIRATORY_TRACT | Status: DC | PRN

## 2011-10-21 MED ORDER — MONTELUKAST SODIUM 10 MG PO TABS: 10.0000 mg | ORAL_TABLET | Freq: Every day | ORAL | Status: DC

## 2011-10-21 MED ORDER — OMEPRAZOLE 40 MG PO CPDR: 40.0000 mg | DELAYED_RELEASE_CAPSULE | Freq: Every day | ORAL | Status: DC

## 2011-10-21 NOTE — Progress Notes (Signed)
Subjective:    Patient ID: Amber Salas, female    DOB: Mar 15, 1976, 36 y.o.   MRN: [id number]  HPI  [name] is a 36 yr old female who presents today for follow up. She was seen last week for an acute asthma exacerbation. The patient was sent to the ED that afternoon. ED records are reviewed.  She was evaluated and discharged home on a prednisone taper. She reports that since she was discharged, her asthma symptoms are improved overall. Last night she had episode of wheezing and chest tightness which improved with use of her albuterol nebulizer. She is using nebulizer every night and several times during the day. She is also using her CPAP machine at bedtime.  She reports some fatigue but also reports that she has not been sleeping well due to her asthma these last few weeks.  She tells me that due to her recent asthma flare ups, 5/28 was the last day that she worked. She is requesting to be written out of work the rest of the week.        Review of Systems See HPI  Past Medical History  Diagnosis Date  . Asthma     history of  . Murmur, cardiac   . Retinal hole or tear     left eye- 1996 due to injury  . Obstructive sleep apnea on CPAP   . Morbid obesity   . Arrhythmia     History   Social History  . Marital Status: Married    Spouse Name: N/A    Number of Children: N/A  . Years of Education: N/A   Occupational History  . Not on file.   Social History Main Topics  . Smoking status: Never Smoker   . Smokeless tobacco: Never Used  . Alcohol Use: No  . Drug Use: No  . Sexually Active: Yes    Birth Control/ Protection: Surgical   Other Topics Concern  . 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    Past Surgical History  Procedure Date  . Cesarean section 4/04  . Hernia repair 04/2009    abdominal hernia  . Wisdom tooth extraction 1999  . Eye surgery  11/2010    lasar repair of retinal tear  . Tubal ligation     Family History  Problem Relation Age of Onset  . Hypertension Mother   . Diabetes Mother     type II  . Other Mother     cervical dysplasia  . Hypertension Father   . Bipolar disorder Father   . Heart disease Maternal Grandmother   . Hypertension Maternal Grandmother   . Bipolar disorder Maternal Grandmother   . Hypertension Maternal Grandfather   . Hypertension Paternal Grandmother   . Hypertension Paternal Grandfather     Allergies  Allergen Reactions  . Fluticasone-Salmeterol Palpitations and Other (See Comments)    chest pain  . Latex Rash and Other (See Comments)    wheezing  . Spinach Rash    Current Outpatient Prescriptions on File Prior to Visit  Medication Sig Dispense Refill  . albuterol (PROVENTIL HFA;VENTOLIN HFA) 108 (90 BASE) MCG/ACT inhaler Inhale 2 puffs into the lungs every 4 (four) hours as needed. For shortness of breath or wheezing      . budesonide-formoterol (SYMBICORT) 160-4.5 MCG/ACT inhaler Inhale 2 puffs into the lungs 2 (two) times daily.  1 Inhaler  5  . cetirizine (ZYRTEC) 10 MG tablet Take 10 mg by mouth daily.      [name] ibuprofen (ADVIL,MOTRIN) 200 MG tablet Take 400 mg by mouth once as needed. For pain      . Multiple Vitamin (MULITIVITAMIN WITH MINERALS) TABS Take 1 tablet by mouth daily.      . predniSONE (DELTASONE) 10 MG tablet Take 2 tablets (20 mg total) by mouth daily.  15 tablet  0  . DISCONTD: albuterol (PROVENTIL) (2.5 MG/3ML) 0.083% nebulizer solution Take 2.5 mg by nebulization every 6 (six) hours as needed. For shortness of breath or wheezing      . montelukast (SINGULAIR) 10 MG tablet Take 1 tablet (10 mg total) by mouth at bedtime.  30 tablet  3  . omeprazole (PRILOSEC) 40 MG capsule Take 1 capsule (40 mg total) by mouth daily.  30 capsule  3    BP 122/80  Pulse 94  Temp(Src) 97.9 F (36.6 C) (Oral)  Resp 18  Ht 5' 5.98" (1.676 m)  Wt 391 lb 0.6 oz (177.375 kg)   BMI 63.15 kg/m2  SpO2 97%  LMP 10/07/2011       Objective:   Physical Exam  Constitutional: She appears well-developed and well-nourished. No distress.  HENT:  Head: Normocephalic and atraumatic.  Eyes: No scleral icterus.  Cardiovascular: Normal rate and regular rhythm.   No murmur heard. Pulmonary/Chest: Effort normal and breath sounds normal. No respiratory distress. She has no wheezes. She has no rales. She exhibits no tenderness.  Skin: Skin is warm and dry.  Psychiatric: She has a normal mood and affect. Her behavior is normal. Judgment and thought content normal.          Assessment & Plan:

## 2011-10-21 NOTE — Assessment & Plan Note (Signed)
Continue CPAP.  We discussed the importance of weight loss today.

## 2011-10-21 NOTE — Assessment & Plan Note (Signed)
Improving. Lungs are clear today.  I have advised her to complete prednisone.  Will add Singulair and omeprazole to see if we can improve the frequency and severity of flare ups. She is to keep her upcoming apt with Pulmonology.  I have advised her that from our stand point, she is cleared to return to work on 6/13.  I have written her a letter re: need for nebulizer machine at work.

## 2011-10-21 NOTE — Patient Instructions (Signed)
Please keep your upcoming appointment with Pulmonology. Follow up in 3 months.

## 2011-10-22 ENCOUNTER — Telehealth: Payer: Self-pay | Admitting: *Deleted

## 2011-10-22 ENCOUNTER — Telehealth: Payer: Self-pay | Admitting: Family

## 2011-10-22 NOTE — Telephone Encounter (Signed)
Signed.

## 2011-10-22 NOTE — Telephone Encounter (Signed)
Received form from [facility] for Attending Physician Statement. Form forwarded to Provider for completion.

## 2011-10-22 NOTE — Telephone Encounter (Signed)
Notified pt. 

## 2011-10-22 NOTE — Telephone Encounter (Signed)
Completed forms faxed to [phone]. Awaiting approval/denial. Left message for pt to return my call.

## 2011-10-22 NOTE — Telephone Encounter (Signed)
Received fax form from pharmacy for prior auth of pt's omeprazole. Form forwarded to Provider for completion and signature.

## 2011-10-23 ENCOUNTER — Emergency Department (HOSPITAL_COMMUNITY)
Admission: EM | Admit: 2011-10-23 | Discharge: 2011-10-23 | Disposition: A | Discharge status: Home / Self Care | Payer: Managed Care, Other (non HMO) | Attending: Emergency Medicine | Admitting: Emergency Medicine

## 2011-10-23 ENCOUNTER — Encounter (HOSPITAL_COMMUNITY): Payer: Self-pay | Admitting: Emergency Medicine

## 2011-10-23 ENCOUNTER — Emergency Department (HOSPITAL_COMMUNITY): Payer: Managed Care, Other (non HMO)

## 2011-10-23 DIAGNOSIS — Z79899 Other long term (current) drug therapy: Secondary | ICD-10-CM | POA: Insufficient documentation

## 2011-10-23 DIAGNOSIS — J45901 Unspecified asthma with (acute) exacerbation: Secondary | ICD-10-CM | POA: Insufficient documentation

## 2011-10-23 DIAGNOSIS — G4733 Obstructive sleep apnea (adult) (pediatric): Secondary | ICD-10-CM | POA: Insufficient documentation

## 2011-10-23 DIAGNOSIS — IMO0002 Reserved for concepts with insufficient information to code with codable children: Secondary | ICD-10-CM | POA: Insufficient documentation

## 2011-10-23 MED ORDER — METHYLPREDNISOLONE SODIUM SUCC 125 MG IJ SOLR
125.0000 mg | Freq: Once | INTRAMUSCULAR | Status: AC
  Administered 2011-10-23: 125 mg via INTRAVENOUS
  Filled 2011-10-23: qty 2

## 2011-10-23 MED ORDER — ALBUTEROL SULFATE (5 MG/ML) 0.5% IN NEBU
INHALATION_SOLUTION | RESPIRATORY_TRACT | Status: AC
  Administered 2011-10-23: 10 mg/h via RESPIRATORY_TRACT
  Filled 2011-10-23: qty 2

## 2011-10-23 MED ORDER — ALBUTEROL (5 MG/ML) CONTINUOUS INHALATION SOLN
10.0000 mg/h | INHALATION_SOLUTION | Freq: Once | RESPIRATORY_TRACT | Status: AC
  Administered 2011-10-23: 10 mg/h via RESPIRATORY_TRACT

## 2011-10-23 MED ORDER — ACETAMINOPHEN 325 MG PO TABS
650.0000 mg | ORAL_TABLET | Freq: Once | ORAL | Status: AC
  Administered 2011-10-23: 650 mg via ORAL
  Filled 2011-10-23: qty 2

## 2011-10-23 MED ORDER — PREDNISONE 10 MG PO TABS: 20.0000 mg | ORAL_TABLET | Freq: Every day | ORAL | Status: DC

## 2011-10-23 MED ORDER — BENZONATATE 100 MG PO CAPS: 100.0000 mg | ORAL_CAPSULE | Freq: Three times a day (TID) | ORAL | Status: AC

## 2011-10-23 MED ORDER — IPRATROPIUM BROMIDE 0.02 % IN SOLN
0.5000 mg | Freq: Once | RESPIRATORY_TRACT | Status: AC
  Administered 2011-10-23: 0.5 mg via RESPIRATORY_TRACT
  Filled 2011-10-23: qty 2.5

## 2011-10-23 MED ORDER — ALBUTEROL SULFATE (5 MG/ML) 0.5% IN NEBU
5.0000 mg | INHALATION_SOLUTION | Freq: Once | RESPIRATORY_TRACT | Status: AC
  Administered 2011-10-23: 5 mg via RESPIRATORY_TRACT
  Filled 2011-10-23: qty 1

## 2011-10-23 NOTE — Telephone Encounter (Signed)
Approval received from [employer] for Omeprazole from 10/22/11 through 10/22/14. Notified pharmacy and pt.

## 2011-10-23 NOTE — ED Notes (Signed)
Pt reports h/o asthma, c/o SOB since last night. Has used inhalers without relief.

## 2011-10-23 NOTE — Discharge Instructions (Signed)
Asthma Attack Prevention HOW CAN ASTHMA BE PREVENTED? Currently, there is no way to prevent asthma from starting. However, you can take steps to control the disease and prevent its symptoms after you have been diagnosed. Learn about your asthma and how to control it. Take an active role to control your asthma by working with your caregiver to create and follow an asthma action plan. An asthma action plan guides you in taking your medicines properly, avoiding factors that make your asthma worse, tracking your level of asthma control, responding to worsening asthma, and seeking emergency care when needed. To track your asthma, keep records of your symptoms, check your peak flow number using a peak flow meter (handheld device that shows how well air moves out of your lungs), and get regular asthma checkups.  Other ways to prevent asthma attacks include:  Use medicines as your caregiver directs.   Identify and avoid things that make your asthma worse (as much as you can).   Keep track of your asthma symptoms and level of control.   Get regular checkups for your asthma.   With your caregiver, write a detailed plan for taking medicines and managing an asthma attack. Then be sure to follow your action plan. Asthma is an ongoing condition that needs regular monitoring and treatment.   Identify and avoid asthma triggers. A number of outdoor allergens and irritants (pollen, mold, cold air, air pollution) can trigger asthma attacks. Find out what causes or makes your asthma worse, and take steps to avoid those triggers (see below).   Monitor your breathing. Learn to recognize warning signs of an attack, such as slight coughing, wheezing or shortness of breath. However, your lung function may already decrease before you notice any signs or symptoms, so regularly measure and record your peak airflow with a home peak flow meter.   Identify and treat attacks early. If you act quickly, you're less likely to have  a severe attack. You will also need less medicine to control your symptoms. When your peak flow measurements decrease and alert you to an upcoming attack, take your medicine as instructed, and immediately stop any activity that may have triggered the attack. If your symptoms do not improve, get medical help.   Pay attention to increasing quick-relief inhaler use. If you find yourself relying on your quick-relief inhaler (such as albuterol), your asthma is not under control. See your caregiver about adjusting your treatment.  IDENTIFY AND CONTROL FACTORS THAT MAKE YOUR ASTHMA WORSE A number of common things can set off or make your asthma symptoms worse (asthma triggers). Keep track of your asthma symptoms for several weeks, detailing all the environmental and emotional factors that are linked with your asthma. When you have an asthma attack, go back to your asthma diary to see which factor, or combination of factors, might have contributed to it. Once you know what these factors are, you can take steps to control many of them.  Allergies: If you have allergies and asthma, it is important to take asthma prevention steps at home. Asthma attacks (worsening of asthma symptoms) can be triggered by allergies, which can cause temporary increased inflammation of your airways. Minimizing contact with the substance to which you are allergic will help prevent an asthma attack. Animal Dander:   Some people are allergic to the flakes of skin or dried saliva from animals with fur or feathers. Keep these pets out of your home.   If you can't keep a pet outdoors, keep the   pet out of your bedroom and other sleeping areas at all times, and keep the door closed.   Remove carpets and furniture covered with cloth from your home. If that is not possible, keep the pet away from fabric-covered furniture and carpets.  Dust Mites:  Many people with asthma are allergic to dust mites. Dust mites are tiny bugs that are found in  every home, in mattresses, pillows, carpets, fabric-covered furniture, bedcovers, clothes, stuffed toys, fabric, and other fabric-covered items.   Cover your mattress in a special dust-proof cover.   Cover your pillow in a special dust-proof cover, or wash the pillow each week in hot water. Water must be hotter than 130 F to kill dust mites. Cold or warm water used with detergent and bleach can also be effective.   Wash the sheets and blankets on your bed each week in hot water.   Try not to sleep or lie on cloth-covered cushions.   Call ahead when traveling and ask for a smoke-free hotel room. Bring your own bedding and pillows, in case the hotel only supplies feather pillows and down comforters, which may contain dust mites and cause asthma symptoms.   Remove carpets from your bedroom and those laid on concrete, if you can.   Keep stuffed toys out of the bed, or wash the toys weekly in hot water or cooler water with detergent and bleach.  Cockroaches:  Many people with asthma are allergic to the droppings and remains of cockroaches.   Keep food and garbage in closed containers. Never leave food out.   Use poison baits, traps, powders, gels, or paste (for example, boric acid).   If a spray is used to kill cockroaches, stay out of the room until the odor goes away.  Indoor Mold:  Fix leaky faucets, pipes, or other sources of water that have mold around them.   Clean moldy surfaces with a cleaner that has bleach in it.  Pollen and Outdoor Mold:  When pollen or mold spore counts are high, try to keep your windows closed.   Stay indoors with windows closed from late morning to afternoon, if you can. Pollen and some mold spore counts are highest at that time.   Ask your caregiver whether you need to take or increase anti-inflammatory medicine before your allergy season starts.  Irritants:   Tobacco smoke is an irritant. If you smoke, ask your caregiver how you can quit. Ask family  members to quit smoking, too. Do not allow smoking in your home or car.   If possible, do not use a wood-burning stove, kerosene heater, or fireplace. Minimize exposure to all sources of smoke, including incense, candles, fires, and fireworks.   Try to stay away from strong odors and sprays, such as perfume, talcum powder, hair spray, and paints.   Decrease humidity in your home and use an indoor air cleaning device. Reduce indoor humidity to below 60 percent. Dehumidifiers or central air conditioners can do this.   Try to have someone else vacuum for you once or twice a week, if you can. Stay out of rooms while they are being vacuumed and for a short while afterward.   If you vacuum, use a dust mask from a hardware store, a double-layered or microfilter vacuum cleaner bag, or a vacuum cleaner with a HEPA filter.   Sulfites in foods and beverages can be irritants. Do not drink beer or wine, or eat dried fruit, processed potatoes, or shrimp if they cause asthma   symptoms.   Cold air can trigger an asthma attack. Cover your nose and mouth with a scarf on cold or windy days.   Several health conditions can make asthma more difficult to manage, including runny nose, sinus infections, reflux disease, psychological stress, and sleep apnea. Your caregiver will treat these conditions, as well.   Avoid close contact with people who have a cold or the flu, since your asthma symptoms may get worse if you catch the infection from them. Wash your hands thoroughly after touching items that may have been handled by people with a respiratory infection.   Get a flu shot every year to protect against the flu virus, which often makes asthma worse for days or weeks. Also get a pneumonia shot once every five to 10 years.  Drugs:  Aspirin and other painkillers can cause asthma attacks. 10% to 20% of people with asthma have sensitivity to aspirin or a group of painkillers called non-steroidal anti-inflammatory drugs  (NSAIDS), such as ibuprofen and naproxen. These drugs are used to treat pain and reduce fevers. Asthma attacks caused by any of these medicines can be severe and even fatal. These drugs must be avoided in people who have known aspirin sensitive asthma. Products with acetaminophen are considered safe for people who have asthma. It is important that people with aspirin sensitivity read labels of all over-the-counter drugs used to treat pain, colds, coughs, and fever.   Beta blockers and ACE inhibitors are other drugs which you should discuss with your caregiver, in relation to your asthma.  ALLERGY SKIN TESTING  Ask your asthma caregiver about allergy skin testing or blood testing (RAST test) to identify the allergens to which you are sensitive. If you are found to have allergies, allergy shots (immunotherapy) for asthma may help prevent future allergies and asthma. With allergy shots, small doses of allergens (substances to which you are allergic) are injected under your skin on a regular schedule. Over a period of time, your body may become used to the allergen and less responsive with asthma symptoms. You can also take measures to minimize your exposure to those allergens. EXERCISE  If you have exercise-induced asthma, or are planning vigorous exercise, or exercise in cold, humid, or dry environments, prevent exercise-induced asthma by following your caregiver's advice regarding asthma treatment before exercising. Document Released: 04/16/2009 Document Revised: 04/17/2011 Document Reviewed: 04/16/2009 [employer] Patient Information 2012 [employer]. 

## 2011-10-23 NOTE — ED Provider Notes (Signed)
History     CSN: [id number]  Arrival date & time 10/23/11  6962   First MD Initiated Contact with Patient 10/23/11 [phone]      Chief Complaint  Patient presents with  . Asthma    (Consider location/radiation/quality/duration/timing/severity/associated sxs/prior treatment) HPI Pt states she has had increasing SOB since 0200 this morning. Mild cough. Nonproductive. Afebrile. No chest pain. Pt has been using inh without relief Past Medical History  Diagnosis Date  . Asthma     history of  . Murmur, cardiac   . Retinal hole or tear     left eye- 1996 due to injury  . Obstructive sleep apnea on CPAP   . Morbid obesity   . Arrhythmia     Past Surgical History  Procedure Date  . Cesarean section 4/04  . Hernia repair 04/2009    abdominal hernia  . Wisdom tooth extraction 1999  . Eye surgery 11/2010    lasar repair of retinal tear  . Tubal ligation     Family History  Problem Relation Age of Onset  . Hypertension Mother   . Diabetes Mother     type II  . Other Mother     cervical dysplasia  . Hypertension Father   . Bipolar disorder Father   . Heart disease Maternal Grandmother   . Hypertension Maternal Grandmother   . Bipolar disorder Maternal Grandmother   . Hypertension Maternal Grandfather   . Hypertension Paternal Grandmother   . Hypertension Paternal Grandfather     History  Substance Use Topics  . Smoking status: Never Smoker   . Smokeless tobacco: Never Used  . Alcohol Use: No    OB History    Grav Para Term Preterm Abortions TAB SAB Ect Mult Living                  Review of Systems  Constitutional: Negative for fever and chills.  Respiratory: Positive for cough, shortness of breath and wheezing.   Cardiovascular: Negative for chest pain, palpitations and leg swelling.  Gastrointestinal: Negative for nausea, vomiting and abdominal pain.  Musculoskeletal: Negative for back pain.  Skin: Negative for rash and wound.  Neurological: Negative for  weakness and numbness.    Allergies  Fluticasone-salmeterol; Latex; and Spinach  Home Medications   Current Outpatient Rx  Name Route Sig Dispense Refill  . ALBUTEROL SULFATE HFA 108 (90 BASE) MCG/ACT IN AERS Inhalation Inhale 2 puffs into the lungs every 4 (four) hours as needed. For shortness of breath or wheezing    . ALBUTEROL SULFATE (2.5 MG/3ML) 0.083% IN NEBU Nebulization Take 3 mLs (2.5 mg total) by nebulization every 4 (four) hours as needed. For shortness of breath or wheezing 75 mL 5  . BUDESONIDE-FORMOTEROL FUMARATE 160-4.5 MCG/ACT IN AERO Inhalation Inhale 2 puffs into the lungs 2 (two) times daily. 1 Inhaler 5  . CETIRIZINE HCL 10 MG PO TABS Oral Take 10 mg by mouth daily.    . IBUPROFEN 200 MG PO TABS Oral Take 400 mg by mouth once as needed. For pain    . ADULT MULTIVITAMIN W/MINERALS CH Oral Take 1 tablet by mouth daily.    [name] PREDNISONE 10 MG PO TABS Oral Take 20 mg by mouth daily. Pt is on day 6 of therapy. Total therapy for 7 days    . BENZONATATE 100 MG PO CAPS Oral Take 1 capsule (100 mg total) by mouth every 8 (eight) hours. 20 capsule 0  . MONTELUKAST SODIUM 10 MG PO  TABS Oral Take 1 tablet (10 mg total) by mouth at bedtime. 30 tablet 3  . OMEPRAZOLE 40 MG PO CPDR Oral Take 1 capsule (40 mg total) by mouth daily. 30 capsule 3  . PREDNISONE 10 MG PO TABS Oral Take 2 tablets (20 mg total) by mouth daily. 10 tablet 0    BP 145/82  Pulse 90  Temp 98 F (36.7 C) (Oral)  Resp 18  SpO2 97%  LMP 10/03/2011  Physical Exam  Nursing note and vitals reviewed. Constitutional: She is oriented to person, place, and time. She appears well-developed and well-nourished.  HENT:  Head: Normocephalic and atraumatic.  Mouth/Throat: Oropharynx is clear and moist.  Eyes: EOM are normal. Pupils are equal, round, and reactive to light.  Neck: Normal range of motion. Neck supple.  Cardiovascular: Normal rate and regular rhythm.   Pulmonary/Chest: Effort normal. She has wheezes  (Expiratory wheezing throughout).  Abdominal: Soft. Bowel sounds are normal. There is no tenderness. There is no rebound and no guarding.  Musculoskeletal: Normal range of motion. She exhibits no edema and no tenderness.  Neurological: She is alert and oriented to person, place, and time.  Skin: Skin is warm and dry. No rash noted. No erythema.  Psychiatric: She has a normal mood and affect. Her behavior is normal.    ED Course  Procedures (including critical care time)  Labs Reviewed - No data to display Dg Chest 2 View  10/23/2011  *RADIOLOGY REPORT*  Clinical Data: Short of breath, wheezing, headache  CHEST - 2 VIEW  Comparison: Chest x-ray of 08/27/2011  Findings: No active infiltrate or effusion is seen.  The lungs are not optimally aerated.  Mediastinal contours are stable.  The heart is within normal limits in size.  No bony abnormality is seen.  IMPRESSION: No active lung disease.  Original Report Authenticated By: Juline Patch, M.D.     1. Acute asthma exacerbation       MDM  Pt states she is breathing much better. Lungs clear on exam. Will d/c home and pt has f/u with pulmonologist. Return for worsening symptoms or any concerns        Loren Racer, MD 10/23/11 1055

## 2011-10-27 ENCOUNTER — Telehealth: Payer: Self-pay | Admitting: *Deleted

## 2011-10-27 NOTE — Telephone Encounter (Signed)
Pt completed her portion of the forms. Return to work date was changed to 10/29/11 as pt was unable to return to work on 10/23/11 due to another flare of her asthma. Pt has appt with Pulmonology tomorrow. Advised pt that further out of work notes/forms will be at the discretion of the pulmonologist. Pt voices understanding. Updated attending physician statement and last 2 office notes faxed to [facility] [phone].

## 2011-10-27 NOTE — Telephone Encounter (Signed)
Received forms from pt requesting update on intermittent FMLA due to asthma. Initiated forms and forwarded to Provider for completion and signature.

## 2011-10-29 ENCOUNTER — Encounter: Payer: Self-pay | Admitting: Internal Medicine

## 2011-10-29 ENCOUNTER — Ambulatory Visit (INDEPENDENT_AMBULATORY_CARE_PROVIDER_SITE_OTHER): Payer: Managed Care, Other (non HMO) | Admitting: Internal Medicine

## 2011-10-29 VITALS — BP 118/78 | HR 89 | Temp 98.1°F | Ht 66.0 in | Wt 393.0 lb

## 2011-10-29 DIAGNOSIS — G4730 Sleep apnea, unspecified: Secondary | ICD-10-CM

## 2011-10-29 DIAGNOSIS — R05 Cough: Secondary | ICD-10-CM

## 2011-10-29 DIAGNOSIS — R0600 Dyspnea, unspecified: Secondary | ICD-10-CM

## 2011-10-29 MED ORDER — PANTOPRAZOLE SODIUM 40 MG PO TBEC: 40.0000 mg | DELAYED_RELEASE_TABLET | Freq: Every day | ORAL | Status: DC

## 2011-10-29 MED ORDER — FLUTICASONE PROPIONATE 50 MCG/ACT NA SUSP: 2.0000 | Freq: Every day | NASAL | Status: DC

## 2011-10-29 NOTE — Patient Instructions (Addendum)
Cough is from sinus drainage, acid reflux, asthma, All of this is working together to cause cyclical cough/LPR cough Very important you follow all instructions 100% and very strictly  #Sinus drainage  - start/continue netti pot daily (nurse will show you picture) - start nasal steroid generic fluticasone inhaler 2 squirts each nostril daily as advised (nurse will do script) - continue your antihistamine treatment  #Possible Acid Reflux  - take protonix 40mg  daily  daily on empty stomach (nurse will send script). Do not fill the prilosec. If protonix expensive call us   - take diet sheet from [place] - avoid colas, spices, cheeses, spirits, red meats, beer, chocolates, fried foods etc.,   - sleep with head end of bed elevated  - eat small frequent meals  - do not go to bed for 3 hours after last meal  # Asthma  -do full PFT breathing test anytime next five days - for now conintue symbicort and singulair - I will look at result and call you. Based on this I might have you adjust medications  #Cyclical cough  - please choose 2-3 days and observe complete voice rest - no talking or whispering  - at all times there  there is urge to cough, drink water or swallow or sip on throat lozenge - stay away from work for 2 weeks because your work involves too much talking  #Sleep Apnea  - please see one of the sleep docs in our office  #Followup - I will see you in 3 weeks (either me or my NP Tammy or Dr Sherene Sires) - any problems call or come sooner

## 2011-10-29 NOTE — Progress Notes (Signed)
Subjective:    Patient ID: Amber Salas, female    DOB: 06-27-1975, 36 y.o.   MRN: [id number]  HPI  PCP is O'SULLIVAN,MELISSA S., NP Body mass index is 63.43 kg/(m^2).  reports that she has never smoked. She has never used smokeless tobacco. Known OSA - on cpap; managed by PMD.    IOV 10/29/2011  Baseline morbidly obese female who has 30# in few years since stopped exercising. Interested in weight loss  Baseline OSA managed on cpap by PMD  At baseline: Hx of ashma x 6 years. Dx in [place], [place] by a primary care physician on basis of symptoms nos and relief with albuterol nebulizer prn. There is no hx of having had PFTs. STarted on symbicort 3 years ago in [place] and reports relief. Typical asthma symptoms are dyspnea, gasping for air, cough, wheezing and resulting in vomiting. Symptoms are episodic including at rest, fumes, car smoke, perfumes. Symptoms are relieved by cppap. At baselin asthma symptom severity is moderate.  There is hx of 8 pred bursts x 12 months  At baseline, does have associated SINUS DRAINAGE and OCC. GERD.   At baseline also has chronic cough that is of moderate severity. 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  -  C/o dyspnea. having paroxysmal nocturnal dyspnea x 2 - 6 months several times a week. Gets up choking. Dyspnea is associated with cough x 2-6 months. Symbicort not helping that well this year. 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 Cough differentiator suggests neurogenic/airway cough worse (score 5 of 5), than GERD (score 3 of 5).       Dr Gretta Cool Reflux Symptom Index (> 13-15 suggestive of LPR cough) 0 -> 5  =  none ->severe problem  Hoarseness of problem with voice 5  Clearing  Of Throat 5  Excess throat mucus or feeling of post nasal drip 5  Difficulty swallowing food, liquid or tablets 1  Cough after eating or lying down 2  Breathing difficulties or  choking episodes 5  Troublesome or annoying cough 3  Sensation of something sticking in throat or lump in throat 4  Heartburn, chest pain, indigestion, or stomach acid coming up 3  TOTAL 33     Kouffman Reflux v Neurogenic Cough Differentiator Reflux Neurogenic Comments  Do you awaken from a sound sleep coughing violently?                            With trouble breathing? Yes    Do you have choking episodes when you cannot  Get enough air, gasping for air ?              Yes    Do you usually cough when you lie down into  The bed, or when you just lie down to rest ?                          Yes    Do you usually cough after meals or eating?             Do you cough when (or after) you bend over?        Do you more-or-less cough all day long?  Yes   Does change of temperature make you cough?  Yes   Does laughing  or chuckling cause you to cough?  YEs   Do fumes (perfume, automobile fumes, burned  Toast, etc.,) cause you to cough ?       Yes   Does speaking, singing, or talking on the phone cause you to cough   ?                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 Asthma     history of  . Murmur, cardiac   . Retinal hole or tear     left eye- 1996 due to injury  . Obstructive sleep apnea on CPAP   . Morbid obesity   . Arrhythmia      Family History  Problem Relation Age of Onset  . Hypertension Mother   . Diabetes Mother     type II  . Other Mother     cervical dysplasia  . Hypertension Father   . Bipolar disorder Father   . Heart disease Maternal Grandmother   . Hypertension Maternal Grandmother   . Bipolar disorder Maternal Grandmother   . Hypertension Maternal Grandfather   . Hypertension Paternal Grandmother   . Hypertension Paternal Grandfather   . Asthma Son     x2     History   Social History  . Marital Status: Married    Spouse Name: N/A     Number of Children: 3  . Years of Education: N/A   Occupational History  . CUSTOMER SERVICE [employer]   Social History Main Topics  . Smoking status: Never Smoker   . Smokeless tobacco: Never Used  . Alcohol Use: No  . Drug Use: No  . Sexually Active: Yes    Birth Control/ Protection: Surgical   Other Topics Concern  . 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     Allergies  Allergen Reactions  . Fluticasone-Salmeterol Palpitations and Other (See Comments)    chest pain  . Latex Rash and Other (See Comments)    wheezing  . Spinach Rash     Outpatient Prescriptions Prior to Visit  Medication Sig Dispense Refill  . albuterol (PROVENTIL HFA;VENTOLIN HFA) 108 (90 BASE) MCG/ACT inhaler Inhale 2 puffs into the lungs every 4 (four) hours as needed. For shortness of breath or wheezing      . albuterol (PROVENTIL) (2.5 MG/3ML) 0.083% nebulizer solution Take 3 mLs (2.5 mg total) by nebulization every 4 (four) hours as needed. For shortness of breath or wheezing  75 mL  5  . benzonatate (TESSALON) 100 MG capsule Take 1 capsule (100 mg total) by mouth every 8 (eight) hours.  20 capsule  0  . budesonide-formoterol (SYMBICORT) 160-4.5 MCG/ACT inhaler Inhale 2 puffs into the lungs 2 (two) times daily.  1 Inhaler  5  . cetirizine (ZYRTEC) 10 MG tablet Take 10 mg by mouth daily.      [name] ibuprofen (ADVIL,MOTRIN) 200 MG tablet Take 400 mg by mouth once as needed. For pain      . montelukast (SINGULAIR) 10 MG tablet Take 1 tablet (10 mg total) by mouth at bedtime.  30 tablet  3  . Multiple Vitamin (MULITIVITAMIN WITH MINERALS) TABS Take 1 tablet by mouth daily.      [name]  omeprazole (PRILOSEC) 40 MG capsule Take 1 capsule (40 mg total) by mouth daily.  30 capsule  3  . predniSONE (DELTASONE) 10 MG tablet Take 2 tablets (20 mg total) by mouth daily.  10 tablet  0  . predniSONE (DELTASONE) 10  MG tablet Take 10 mg by mouth daily. Pt is on day 6 of therapy. Total therapy for 7 days          Review of Systems  Constitutional: Positive for unexpected weight change. Negative for fever.  HENT: Positive for congestion, sore throat, rhinorrhea, sneezing, trouble swallowing, postnasal drip and sinus pressure. Negative for ear pain, nosebleeds and dental problem.   Eyes: Positive for redness and itching.  Respiratory: Positive for cough, chest tightness, shortness of breath and wheezing.   Cardiovascular: Positive for palpitations and leg swelling.  Gastrointestinal: Positive for nausea and vomiting.  Genitourinary: Negative for dysuria.  Musculoskeletal: Positive for joint swelling.  Skin: Negative for rash.  Neurological: Positive for headaches.  Hematological: Bruises/bleeds easily.  Psychiatric/Behavioral: Negative for dysphoric mood. The patient is not nervous/anxious.        Objective:   Physical Exam  Vitals reviewed. Constitutional: She is oriented to person, place, and time. She appears well-developed and well-nourished. No distress.       Body mass index is 63.43 kg/(m^2).   HENT:  Head: Normocephalic and atraumatic.  Right Ear: External ear normal.  Left Ear: External ear normal.  Mouth/Throat: Oropharynx is clear and moist. No oropharyngeal exudate.  Eyes: Conjunctivae and EOM are normal. Pupils are equal, round, and reactive to light. Right eye exhibits no discharge. Left eye exhibits no discharge. No scleral icterus.  Neck: Normal range of motion. Neck supple. No JVD present. No tracheal deviation present. No thyromegaly present.  Cardiovascular: Normal rate, regular rhythm and intact distal pulses.  Exam reveals no gallop and no friction rub.   Murmur heard. Pulmonary/Chest: Effort normal and breath sounds normal. No respiratory distress. She has no wheezes. She has no rales. She exhibits no tenderness.  Abdominal: Soft. Bowel sounds are normal. She exhibits no  distension and no mass. There is no tenderness. There is no rebound and no guarding.  Musculoskeletal: Normal range of motion. She exhibits no edema and no tenderness.  Lymphadenopathy:    She has no cervical adenopathy.  Neurological: She is alert and oriented to person, place, and time. She has normal reflexes. No cranial nerve deficit. She exhibits normal muscle tone. Coordination normal.  Skin: Skin is warm and dry. No rash noted. She is not diaphoretic. No erythema. No pallor.  Psychiatric: She has a normal mood and affect. Her behavior is normal. Judgment and thought content normal.          Assessment & Plan:

## 2011-10-30 NOTE — Telephone Encounter (Signed)
FMLA forms completed and faxed to [employer] at [phone].

## 2011-10-31 DIAGNOSIS — R05 Cough: Secondary | ICD-10-CM | POA: Insufficient documentation

## 2011-10-31 NOTE — Assessment & Plan Note (Signed)
She is interested in weihgt loss  PLAN  - at a future followup will discuss low glycemic diet

## 2011-10-31 NOTE — Assessment & Plan Note (Addendum)
I think the real problem is chronic cough from sinus, gerd, +/- asthma. Hard to sort out what % of symptms is from asthma if she really has asthma or not. Best to approach her issues as one of chronic cough and reasssess  PLAN Cough is from sinus drainage, acid reflux, asthma, All of this is working together to cause cyclical cough/LPR cough Very important you follow all instructions 100% and very strictly  #Sinus drainage  - start/continue netti pot daily (nurse will show you picture) - start nasal steroid generic fluticasone inhaler 2 squirts each nostril daily as advised (nurse will do script) - continue your antihistamine treatment  #Possible Acid Reflux  - take protonix 40mg  daily  daily on empty stomach (nurse will send script). Do not fill the prilosec. If protonix expensive call us   - take diet sheet from [place] - avoid colas, spices, cheeses, spirits, red meats, beer, chocolates, fried foods etc.,   - sleep with head end of bed elevated  - eat small frequent meals  - do not go to bed for 3 hours after last meal  # Asthma  -do full PFT breathing test anytime next five days - for now conintue symbicort and singulair - I will look at result and call you. Based on this I might have you adjust medications  #Cyclical cough  - please choose 2-3 days and observe complete voice rest - no talking or whispering  - at all times there  there is urge to cough, drink water or swallow or sip on throat lozenge - stay away from work for 2 weeks because your work involves too much talking  #Followup - I will see you in 3 weeks (either me or my NP Tammy or Dr Sherene Sires) - any problems call or come sooner

## 2011-10-31 NOTE — Assessment & Plan Note (Signed)
Will refer to one oof the [facility] sleep docs

## 2011-11-05 ENCOUNTER — Emergency Department (HOSPITAL_COMMUNITY)
Admission: EM | Admit: 2011-11-05 | Discharge: 2011-11-05 | Disposition: A | Discharge status: Home / Self Care | Payer: Managed Care, Other (non HMO) | Attending: Emergency Medicine | Admitting: Emergency Medicine

## 2011-11-05 ENCOUNTER — Encounter (HOSPITAL_COMMUNITY): Payer: Self-pay | Admitting: *Deleted

## 2011-11-05 ENCOUNTER — Emergency Department (HOSPITAL_COMMUNITY): Payer: Managed Care, Other (non HMO)

## 2011-11-05 ENCOUNTER — Telehealth: Payer: Self-pay | Admitting: Internal Medicine

## 2011-11-05 DIAGNOSIS — R079 Chest pain, unspecified: Secondary | ICD-10-CM | POA: Insufficient documentation

## 2011-11-05 DIAGNOSIS — J45909 Unspecified asthma, uncomplicated: Secondary | ICD-10-CM | POA: Insufficient documentation

## 2011-11-05 DIAGNOSIS — G4733 Obstructive sleep apnea (adult) (pediatric): Secondary | ICD-10-CM | POA: Insufficient documentation

## 2011-11-05 LAB — CBC WITH DIFFERENTIAL/PLATELET
Eosinophils Relative: 1 % (ref 0–5)
HCT: 38.2 % (ref 36.0–46.0)
Lymphocytes Relative: 12 % (ref 12–46)
Lymphs Abs: 2 10*3/uL (ref 0.7–4.0)
MCV: 86.2 fL (ref 78.0–100.0)
Platelets: 357 10*3/uL (ref 150–400)
RBC: 4.43 MIL/uL (ref 3.87–5.11)
WBC: 16.3 10*3/uL — ABNORMAL HIGH (ref 4.0–10.5)

## 2011-11-05 LAB — COMPREHENSIVE METABOLIC PANEL
ALT: 15 U/L (ref 0–35)
Alkaline Phosphatase: 73 U/L (ref 39–117)
CO2: 23 mEq/L (ref 19–32)
Calcium: 9 mg/dL (ref 8.4–10.5)
GFR calc Af Amer: >90 mL/min (ref >90)
GFR calc non Af Amer: >90 mL/min (ref >90)
Glucose, Bld: 116 mg/dL — ABNORMAL HIGH (ref 70–99)
Sodium: 139 mEq/L (ref 135–145)
Total Bilirubin: 0.2 mg/dL — ABNORMAL LOW (ref 0.3–1.2)

## 2011-11-05 LAB — URINALYSIS, ROUTINE W REFLEX MICROSCOPIC
Bilirubin Urine: NEGATIVE
Ketones, ur: NEGATIVE mg/dL
Nitrite: NEGATIVE
Protein, ur: NEGATIVE mg/dL
Urobilinogen, UA: 0.2 mg/dL (ref 0.0–1.0)

## 2011-11-05 NOTE — ED Notes (Signed)
Pt at [facility] office suite to fill out some paper work. Staff noticed pt in elevator staggering, weakness, dizzy, assisted to sit and 911 called. Pt is alert now

## 2011-11-05 NOTE — Discharge Instructions (Signed)
Followup with your pulmonologist and your regular medical doctor closely for further evaluation of EKG changes however return to emergency department any time for emergent changing or worsening symptoms.   Obesity Obesity is defined as having a body mass index (BMI) of 30 or more. To calculate your BMI divide your weight in pounds by your height in inches squared and multiply that product by 703. Major illnesses resulting from long-term obesity include:  Stroke.   Heart disease.   Diabetes.   Many cancers.   Arthritis.  Obesity also complicates recovery from many other medical problems.  CAUSES   A history of obesity in your parents.   Thyroid hormone imbalance.   Environmental factors such as excess calorie intake and physical inactivity.  TREATMENT  A healthy weight loss program includes:  A calorie restricted diet based on individual calorie needs.   Increased physical activity (exercise).  An exercise program is just as important as the right low-calorie diet.  Weight-loss medicines should be used only under the supervision of your physician. These medicines help, but only if they are used with diet and exercise programs. Medicines can have side effects including nervousness, nausea, abdominal pain, diarrhea, headache, drowsiness, and depression.  An unhealthy weight loss program includes:  Fasting.   Fad diets.   Supplements and drugs.  These choices do not succeed in long-term weight control.  HOME CARE INSTRUCTIONS  To help you make the needed dietary changes:   Exercise and perform physical activity as directed by your caregiver.   Keep a daily record of everything you eat. There are many free websites to help you with this. It may be helpful to measure your foods so you can determine if you are eating the correct portion sizes.   Use low-calorie cookbooks or take special cooking classes.   Avoid alcohol. Drink more water and drinks with no calories.   Take  vitamins and supplements only as recommended by your caregiver.   Weight loss support groups, Registered Dieticians, counselors, and stress reduction education can also be very helpful.  Document Released: 06/05/2004 Document Revised: 04/17/2011 Document Reviewed: 04/04/2007 [facility] Patient Information 2012 [place], [place].  Body Mass Index (BMI) This BMI is not intended for use with those under 38 years of age, or pregnant women, or lactating women. To estimate BMI, locate your height, then find your weight in this listing. Your BMI is located to the right of your weight. Height: 58 inches  Weight: 91 lb = BMI 19 (normal)   Weight: 96 lb = BMI 20 (normal)   Weight: 100 lb = BMI 21 (normal)   Weight: 105 lb = BMI 22 (normal)   Weight: 110 lb = BMI 23 (normal)   Weight: 115 lb = BMI 24 (normal)   Weight: 119 lb = BMI 25 (overweight)   Weight: 124 lb = BMI 26 (overweight)   Weight: 129 lb = BMI 27 (overweight)   Weight: 134 lb = BMI 28 (overweight)   Weight: 138 lb = BMI 29 (overweight)   Weight: 143 lb = BMI 30 (obese)   Weight: 148 lb = BMI 31 (obese)   Weight: 153 lb = BMI 32 (obese)   Weight: 158 lb = BMI 33 (obese)   Weight: 162 lb = BMI 34 (obese)   Weight: 167 lb = BMI 35 (obese)   Weight: 172 lb = BMI 36 (obese)   Weight: 177 lb = BMI 37 (obese)   Weight: 181 lb = BMI 38 (obese)  Weight: 186 lb = BMI 39 (obese)   Weight: 191 lb = BMI 40 (extreme obesity)   Weight: 196 lb = BMI 41 (extreme obesity)   Weight: 201 lb = BMI 42 (extreme obesity)   Weight: 205 lb = BMI 43 (extreme obesity)   Weight: 210 lb = BMI 44 (extreme obesity)   Weight: 215 lb = BMI 45 (extreme obesity)   Weight: 220 lb = BMI 46 (extreme obesity)   Weight: 224 lb = BMI 47 (extreme obesity)   Weight: 229 lb = BMI 48 (extreme obesity)   Weight: 234 lb = BMI 49 (extreme obesity)   Weight: 239 lb = BMI 50 (extreme obesity)   Weight: 244 lb = BMI 51 (extreme obesity)    Weight: 248 lb = BMI 52 (extreme obesity)   Weight: 253 lb = BMI 53 (extreme obesity)   Weight: 258 lb = BMI 54 (extreme obesity)  Height: 59 inches  Weight: 94 lb = BMI 19 (normal)   Weight: 99 lb = BMI 20 (normal)   Weight: 104 lb = BMI 21 (normal)   Weight: 109 lb = BMI 22 (normal)   Weight: 114 lb = BMI 23 (normal)   Weight: 119 lb = BMI 24 (normal)   Weight: 124 lb = BMI 25 (overweight)   Weight: 128 lb = BMI 26 (overweight)   Weight: 133 lb = BMI 27 (overweight)   Weight: 138 lb = BMI 28 (overweight)   Weight: 143 lb = BMI 29 (overweight)   Weight: 148 lb = BMI 30 (obese)   Weight: 153 lb = BMI 31 (obese)   Weight: 158 lb = BMI 32 (obese)   Weight: 163 lb = BMI 33 (obese)   Weight: 168 lb = BMI 34 (obese)   Weight: 173 lb = BMI 35 (obese)   Weight: 178 lb = BMI 36 (obese)   Weight: 183 lb = BMI 37 (obese)   Weight: 188 lb = BMI 38 (obese)   Weight: 193 lb = BMI 39 (obese)   Weight: 198 lb = BMI 40 (extreme obesity)   Weight: 203 lb = BMI 41 (extreme obesity)   Weight: 208 lb = BMI 42 (extreme obesity)   Weight: 212 lb = BMI 43 (extreme obesity)   Weight: 217 lb = BMI 44 (extreme obesity)   Weight: 222 lb = BMI 45 (extreme obesity)   Weight: 227 lb = BMI 46 (extreme obesity)   Weight: 232 lb = BMI 47 (extreme obesity)   Weight: 237 lb = BMI 48 (extreme obesity)   Weight: 242 lb = BMI 49 (extreme obesity)   Weight: 247 lb = BMI 50 (extreme obesity)   Weight: 252 lb = BMI 51 (extreme obesity)   Weight: 257 lb = BMI 52 (extreme obesity)   Weight: 262 lb = BMI 53 (extreme obesity)   Weight: 267 lb = BMI 54 (extreme obesity)  Height: 60 inches  Weight: 97 lb = BMI 19 (normal)   Weight: 102 lb = BMI 20 (normal)   Weight: 107 lb = BMI 21 (normal)   Weight: 112 lb = BMI 22 (normal)   Weight: 118 lb = BMI 23 (normal)   Weight: 123 lb = BMI 24 (normal)   Weight: 128 lb = BMI 25 (overweight)   Weight: 133 lb = BMI 26  (overweight)   Weight: 138 lb = BMI 27 (overweight)   Weight: 143 lb = BMI 28 (overweight)   Weight: 148 lb =  BMI 29 (overweight)   Weight: 153 lb = BMI 30 (obese)   Weight: 158 lb = BMI 31 (obese)   Weight: 163 lb = BMI 32 (obese)   Weight: 168 lb = BMI 33 (obese)   Weight: 174 lb = BMI 34 (obese)   Weight: 179 lb = BMI 35 (obese)   Weight: 184 lb = BMI 36 (obese)   Weight: 189 lb = BMI 37 (obese)   Weight: 194 lb = BMI 38 (obese)   Weight: 199 lb = BMI 39 (obese)   Weight: 204 lb = BMI 40 (extreme obesity)   Weight: 209 lb = BMI 41 (extreme obesity)   Weight: 215 lb = BMI 42 (extreme obesity)   Weight: 220 lb = BMI 43 (extreme obesity)   Weight: 225 lb = BMI 44 (extreme obesity)   Weight: 230 lb = BMI 45 (extreme obesity)   Weight: 235 lb = BMI 46 (extreme obesity)   Weight: 240 lb = BMI 47 (extreme obesity)   Weight: 245 lb = BMI 48 (extreme obesity)   Weight: 250 lb = BMI 49 (extreme obesity)   Weight: 255 lb = BMI 50 (extreme obesity)   Weight: 261 lb = BMI 51 (extreme obesity)   Weight: 266 lb = BMI 52 (extreme obesity)   Weight: 271 lb = BMI 53 (extreme obesity)   Weight: 276 lb = BMI 54 (extreme obesity)  Height: 61 inches  Weight: 100 lb = BMI 19 (normal)   Weight: 106 lb = BMI 20 (normal)   Weight: 111 lb = BMI 21 (normal)   Weight: 116 lb = BMI 22 (normal)   Weight: 122 lb = BMI 23 (normal)   Weight: 127 lb = BMI 24 (normal)   Weight: 132 lb = BMI 25 (overweight)   Weight: 137 lb = BMI 26 (overweight)   Weight: 143 lb = BMI 27 (overweight)   Weight: 148 lb = BMI 28 (overweight)   Weight: 153 lb = BMI 29 (overweight)   Weight: 158 lb = BMI 30 (obese)   Weight: 164 lb = BMI 31 (obese)   Weight: 169 lb = BMI 32 (obese)   Weight: 174 lb = BMI 33 (obese)   Weight: 180 lb = BMI 34 (obese)   Weight: 185 lb = BMI 35 (obese)   Weight: 190 lb = BMI 36 (obese)   Weight: 195 lb = BMI 37 (obese)   Weight: 201 lb = BMI 38  (obese)   Weight: 206 lb = BMI 39 (obese)   Weight: 211 lb = BMI 40 (extreme obesity)   Weight: 217 lb = BMI 41 (extreme obesity)   Weight: 222 lb = BMI 42 (extreme obesity)   Weight: 227 lb = BMI 43 (extreme obesity)   Weight: 232 lb = BMI 44 (extreme obesity)   Weight: 238 lb = BMI 45 (extreme obesity)   Weight: 243 lb = BMI 46 (extreme obesity)   Weight: 248 lb = BMI 47 (extreme obesity)   Weight: 254 lb = BMI 48 (extreme obesity)   Weight: 259 lb = BMI 49 (extreme obesity)   Weight: 264 lb = BMI 50 (extreme obesity)   Weight: 269 lb = BMI 51 (extreme obesity)   Weight: 275 lb = BMI 52 (extreme obesity)   Weight: 280 lb = BMI 53 (extreme obesity)   Weight: 285 lb = BMI 54 (extreme obesity)  Height: 62 inches  Weight: 104 lb = BMI 19 (normal)  Weight: 109 lb = BMI 20 (normal)   Weight: 115 lb = BMI 21 (normal)   Weight: 120 lb = BMI 22 (normal)   Weight: 126 lb = BMI 23 (normal)   Weight: 131 lb = BMI 24 (normal)   Weight: 136 lb = BMI 25 (overweight)   Weight: 142 lb = BMI 26 (overweight)   Weight: 147 lb = BMI 27 (overweight)   Weight: 153 lb = BMI 28 (overweight)   Weight: 158 lb = BMI 29 (overweight)   Weight: 164 lb = BMI 30 (obese)   Weight: 169 lb = BMI 31 (obese)   Weight: 175 lb = BMI 32 (obese)   Weight: 180 lb = BMI 33 (obese)   Weight: 186 lb = BMI 34 (obese)   Weight: 191 lb = BMI 35 (obese)   Weight: 196 lb = BMI 36 (obese)   Weight: 202 lb = BMI 37 (obese)   Weight: 207 lb = BMI 38 (obese)   Weight: 213 lb = BMI 39 (obese)   Weight: 218 lb = BMI 40 (extreme obesity)   Weight: 224 lb = BMI 41 (extreme obesity)   Weight: 229 lb = BMI 42 (extreme obesity)   Weight: 235 lb = BMI 43 (extreme obesity)   Weight: 240 lb = BMI 44 (extreme obesity)   Weight: 246 lb = BMI 45 (extreme obesity)   Weight: 251 lb = BMI 46 (extreme obesity)   Weight: 256 lb = BMI 47 (extreme obesity)   Weight: 262 lb = BMI 48 (extreme  obesity)   Weight: 267 lb = BMI 49 (extreme obesity)   Weight: 273 lb = BMI 50 (extreme obesity)   Weight: 278 lb = BMI 51 (extreme obesity)   Weight: 284 lb = BMI 52 (extreme obesity)   Weight: 289 lb = BMI 53 (extreme obesity)   Weight: 295 lb = BMI 54 (extreme obesity)  Height: 63 inches  Weight: 107 lb = BMI 19 (normal)   Weight: 113 lb = BMI 20 (normal)   Weight: 118 lb = BMI 21 (normal)   Weight: 124 lb = BMI 22 (normal)   Weight: 130 lb = BMI 23 (normal)   Weight: 135 lb = BMI 24 (normal)   Weight: 141 lb = BMI 25 (overweight)   Weight: 146 lb = BMI 26 (overweight)   Weight: 152 lb = BMI 27 (overweight)   Weight: 158 lb = BMI 28 (overweight)   Weight: 163 lb = BMI 29 (overweight)   Weight: 169 lb = BMI 30 (obese)   Weight: 175 lb = BMI 31 (obese)   Weight: 180 lb = BMI 32 (obese)   Weight: 186 lb = BMI 33 (obese)   Weight: 191 lb = BMI 34 (obese)   Weight: 197 lb = BMI 35 (obese)   Weight: 203 lb = BMI 36 (obese)   Weight: 208 lb = BMI 37 (obese)   Weight: 214 lb = BMI 38 (obese)   Weight: 220 lb = BMI 39 (obese)   Weight: 225 lb = BMI 40 (extreme obesity)   Weight: 231 lb = BMI 41 (extreme obesity)   Weight: 237 lb = BMI 42 (extreme obesity)   Weight: 242 lb = BMI 43 (extreme obesity)   Weight: 248 lb = BMI 44 (extreme obesity)   Weight: 254 lb = BMI 45 (extreme obesity)   Weight: 259 lb = BMI 46 (extreme obesity)   Weight: 265 lb = BMI 47 (extreme  obesity)   Weight: 270 lb = BMI 48 (extreme obesity)   Weight: 278 lb = BMI 49 (extreme obesity)   Weight: 282 lb = BMI 50 (extreme obesity)   Weight: 287 lb = BMI 51 (extreme obesity)   Weight: 293 lb = BMI 52 (extreme obesity)   Weight: 299 lb = BMI 53 (extreme obesity)   Weight: 304 lb = BMI 54 (extreme obesity)  Height: 64 inches  Weight: 110 lb = BMI 19 (normal)   Weight: 116 lb = BMI 20 (normal)   Weight: 122 lb = BMI 21 (normal)   Weight: 128 lb = BMI 22 (normal)    Weight: 134 lb = BMI 23 (normal)   Weight: 140 lb = BMI 24 (normal)   Weight: 145 lb = BMI 25 (overweight)   Weight: 151 lb = BMI 26 (overweight)   Weight: 157 lb = BMI 27 (overweight)   Weight: 163 lb = BMI 28 (overweight)   Weight: 169 lb = BMI 29 (overweight)   Weight: 174 lb = BMI 30 (obese)   Weight: 180 lb = BMI 31 (obese)   Weight: 186 lb = BMI 32 (obese)   Weight: 192 lb = BMI 33 (obese)   Weight: 197 lb = BMI 34 (obese)   Weight: 204 lb = BMI 35 (obese)   Weight: 209 lb = BMI 36 (obese)   Weight: 215 lb = BMI 37 (obese)   Weight: 221 lb = BMI 38 (obese)   Weight: 227 lb = BMI 39 (obese)   Weight: 232 lb = BMI 40 (extreme obesity)   Weight: 238 lb = BMI 41 (extreme obesity)   Weight: 244 lb = BMI 42 (extreme obesity)   Weight: 250 lb = BMI 43 (extreme obesity)   Weight: 256 lb = BMI 44 (extreme obesity)   Weight: 262 lb = BMI 45 (extreme obesity)   Weight: 267 lb = BMI 46 (extreme obesity)   Weight: 273 lb = BMI 47 (extreme obesity)   Weight: 279 lb = BMI 48 (extreme obesity)   Weight: 285 lb = BMI 49 (extreme obesity)   Weight: 291 lb = BMI 50 (extreme obesity)   Weight: 296 lb = BMI 51 (extreme obesity)   Weight: 302 lb = BMI 52 (extreme obesity)   Weight: 308 lb = BMI 53 (extreme obesity)   Weight: 314 lb = BMI 54 (extreme obesity)  Height: 65 inches  Weight: 114 lb = BMI 19 (normal)   Weight: 120 lb = BMI 20 (normal)   Weight: 126 lb = BMI 21 (normal)   Weight: 132 lb = BMI 22 (normal)   Weight: 138 lb = BMI 23 (normal)   Weight: 144 lb = BMI 24 (normal)   Weight: 150 lb = BMI 25 (overweight)   Weight: 156 lb = BMI 26 (overweight)   Weight: 162 lb = BMI 27 (overweight)   Weight: 168 lb = BMI 28 (overweight)   Weight: 174 lb = BMI 29 (overweight)   Weight: 180 lb = BMI 30 (obese)   Weight: 186 lb = BMI 31 (obese)   Weight: 192 lb = BMI 32 (obese)   Weight: 198 lb = BMI 33 (obese)   Weight: 204 lb = BMI 34  (obese)   Weight: 210 lb = BMI 35 (obese)   Weight: 216 lb = BMI 36 (obese)   Weight: 222 lb = BMI 37 (obese)   Weight: 228 lb = BMI 38 (obese)  Weight: 234 lb = BMI 39 (obese)   Weight: 240 lb = BMI 40 (extreme obesity)   Weight: 246 lb = BMI 41 (extreme obesity)   Weight: 252 lb = BMI 42 (extreme obesity)   Weight: 258 lb = BMI 43 (extreme obesity)   Weight: 264 lb = BMI 44 (extreme obesity)   Weight: 270 lb = BMI 45 (extreme obesity)   Weight: 276 lb = BMI 46 (extreme obesity)   Weight: 282 lb = BMI 47 (extreme obesity)   Weight: 288 lb = BMI 48 (extreme obesity)   Weight: 294 lb = BMI 49 (extreme obesity)   Weight: 300 lb = BMI 50 (extreme obesity)   Weight: 306 lb = BMI 51 (extreme obesity)   Weight: 312 lb = BMI 52 (extreme obesity)   Weight: 318 lb = BMI 53 (extreme obesity)   Weight: 324 lb = BMI 54 (extreme obesity)  Height: 66 inches  Weight: 118 lb = BMI 19 (normal)   Weight: 124 lb = BMI 20 (normal)   Weight: 130 lb = BMI 21 (normal)   Weight: 136 lb = BMI 22 (normal)   Weight: 142 lb = BMI 23 (normal)   Weight: 148 lb = BMI 24 (normal)   Weight: 155 lb = BMI 25 (overweight)   Weight: 161 lb = BMI 26 (overweight)   Weight: 167 lb = BMI 27 (overweight)   Weight: 173 lb = BMI 28 (overweight)   Weight: 179 lb = BMI 29 (overweight)   Weight: 186 lb = BMI 30 (obese)   Weight: 192 lb = BMI 31 (obese)   Weight: 198 lb = BMI 32 (obese)   Weight: 204 lb = BMI 33 (obese)   Weight: 210 lb = BMI 34 (obese)   Weight: 216 lb = BMI 35 (obese)   Weight: 223 lb = BMI 36 (obese)   Weight: 229 lb = BMI 37 (obese)   Weight: 235 lb = BMI 38 (obese)   Weight: 241 lb = BMI 39 (obese)   Weight: 247 lb = BMI 40 (extreme obesity)   Weight: 253 lb = BMI 41 (extreme obesity)   Weight: 260 lb = BMI 42 (extreme obesity)   Weight: 266 lb = BMI 43 (extreme obesity)   Weight: 272 lb = BMI 44 (extreme obesity)   Weight: 278 lb = BMI 45  (extreme obesity)   Weight: 284 lb = BMI 46 (extreme obesity)   Weight: 291 lb = BMI 47 (extreme obesity)   Weight: 297 lb = BMI 48 (extreme obesity)   Weight: 303 lb = BMI 49 (extreme obesity)   Weight: 309 lb = BMI 50 (extreme obesity)   Weight: 315 lb = BMI 51 (extreme obesity)   Weight: 322 lb = BMI 52 (extreme obesity)   Weight: 328 lb = BMI 53 (extreme obesity)   Weight: 334 lb = BMI 54 (extreme obesity)  Height: 67 inches  Weight: 121 lb = BMI 19 (normal)   Weight: 127 lb = BMI 20 (normal)   Weight: 134 lb = BMI 21 (normal)   Weight: 140 lb = BMI 22 (normal)   Weight: 146 lb = BMI 23 (normal)   Weight: 153 lb = BMI 24 (normal)   Weight: 159 lb = BMI 25 (overweight)   Weight: 166 lb = BMI 26 (overweight)   Weight: 172 lb = BMI 27 (overweight)   Weight: 178 lb = BMI 28 (overweight)   Weight: 185 lb = BMI  29 (overweight)   Weight: 191 lb = BMI 30 (obese)   Weight: 198 lb = BMI 31 (obese)   Weight: 204 lb = BMI 32 (obese)   Weight: 211 lb = BMI 33 (obese)   Weight: 217 lb = BMI 34 (obese)   Weight: 223 lb = BMI 35 (obese)   Weight: 230 lb = BMI 36 (obese)   Weight: 236 lb = BMI 37 (obese)   Weight: 242 lb = BMI 38 (obese)   Weight: 249 lb = BMI 39 (obese)   Weight: 255 lb = BMI 40 (extreme obesity)   Weight: 261 lb = BMI 41 (extreme obesity)   Weight: 268 lb = BMI 42 (extreme obesity)   Weight: 274 lb = BMI 43 (extreme obesity)   Weight: 280 lb = BMI 44 (extreme obesity)   Weight: 287 lb = BMI 45 (extreme obesity)   Weight: 293 lb = BMI 46 (extreme obesity)   Weight: 299 lb = BMI 47 (extreme obesity)   Weight: 306 lb = BMI 48 (extreme obesity)   Weight: 312 lb = BMI 49 (extreme obesity)   Weight: 319 lb = BMI 50 (extreme obesity)   Weight: 325 lb = BMI 51 (extreme obesity)   Weight: 331 lb = BMI 52 (extreme obesity)   Weight: 338 lb = BMI 53 (extreme obesity)   Weight: 344 lb = BMI 54 (extreme obesity)  Height: 68  inches  Weight: 125 lb = BMI 19 (normal)   Weight: 131 lb = BMI 20 (normal)   Weight: 138 lb = BMI 21 (normal)   Weight: 144 lb = BMI 22 (normal)   Weight: 151 lb = BMI 23 (normal)   Weight: 158 lb = BMI 24 (normal)   Weight: 164 lb = BMI 25 (overweight)   Weight: 171 lb = BMI 26 (overweight)   Weight: 177 lb = BMI 27 (overweight)   Weight: 184 lb = BMI 28 (overweight)   Weight: 190 lb = BMI 29 (overweight)   Weight: 197 lb = BMI 30 (obese)   Weight: 203 lb = BMI 31 (obese)   Weight: 210 lb = BMI 32 (obese)   Weight: 216 lb = BMI 33 (obese)   Weight: 223 lb = BMI 34 (obese)   Weight: 230 lb = BMI 35 (obese)   Weight: 236 lb = BMI 36 (obese)   Weight: 243 lb = BMI 37 (obese)   Weight: 249 lb = BMI 38 (obese)   Weight: 256 lb = BMI 39 (obese)   Weight: 262 lb = BMI 40 (extreme obesity)   Weight: 269 lb = BMI 41 (extreme obesity)   Weight: 276 lb = BMI 42 (extreme obesity)   Weight: 282 lb = BMI 43 (extreme obesity)   Weight: 289 lb = BMI 44 (extreme obesity)   Weight: 295 lb = BMI 45 (extreme obesity)   Weight: 302 lb = BMI 46 (extreme obesity)   Weight: 308 lb = BMI 47 (extreme obesity)   Weight: 315 lb = BMI 48 (extreme obesity)   Weight: 322 lb = BMI 49 (extreme obesity)   Weight: 328 lb = BMI 50 (extreme obesity)   Weight: 335 lb = BMI 51 (extreme obesity)   Weight: 341 lb = BMI 52 (extreme obesity)   Weight: 348 lb = BMI 53 (extreme obesity)   Weight: 354 lb = BMI 54 (extreme obesity)  Height: 69 inches  Weight: 128 lb = BMI 19 (normal)   Weight:  135 lb = BMI 20 (normal)   Weight: 142 lb = BMI 21 (normal)   Weight: 149 lb = BMI 22 (normal)   Weight: 155 lb = BMI 23 (normal)   Weight: 162 lb = BMI 24 (normal)   Weight: 169 lb = BMI 25 (overweight)   Weight: 176 lb = BMI 26 (overweight)   Weight: 182 lb = BMI 27 (overweight)   Weight: 189 lb = BMI 28 (overweight)   Weight: 196 lb = BMI 29 (overweight)   Weight: 203 lb =  BMI 30 (obese)   Weight: 209 lb = BMI 31 (obese)   Weight: 216 lb = BMI 32 (obese)   Weight: 223 lb = BMI 33 (obese)   Weight: 230 lb = BMI 34 (obese)   Weight: 236 lb = BMI 35 (obese)   Weight: 243 lb = BMI 36 (obese)   Weight: 250 lb = BMI 37 (obese)   Weight: 257 lb = BMI 38 (obese)   Weight: 263 lb = BMI 39 (obese)   Weight: 270 lb = BMI 40 (extreme obesity)   Weight: 277 lb = BMI 41 (extreme obesity)   Weight: 284 lb = BMI 42 (extreme obesity)   Weight: 291 lb = BMI 43 (extreme obesity)   Weight: 297 lb = BMI 44 (extreme obesity)   Weight: 304 lb = BMI 45 (extreme obesity)   Weight: 311 lb = BMI 46 (extreme obesity)   Weight: 318 lb = BMI 47 (extreme obesity)   Weight: 324 lb = BMI 48 (extreme obesity)   Weight: 331 lb = BMI 49 (extreme obesity)   Weight: 338 lb = BMI 50 (extreme obesity)   Weight: 345 lb = BMI 51 (extreme obesity)   Weight: 351 lb = BMI 52 (extreme obesity)   Weight: 358 lb = BMI 53 (extreme obesity)   Weight: 365 lb = BMI 54 (extreme obesity)  Height: 70 inches  Weight: 132 lb = BMI 19 (normal)   Weight: 139 lb = BMI 20 (normal)   Weight: 146 lb = BMI 21 (normal)   Weight: 153 lb = BMI 22 (normal)   Weight: 160 lb = BMI 23 (normal)   Weight: 167 lb = BMI 24 (normal)   Weight: 174 lb = BMI 25 (overweight)   Weight: 181 lb = BMI 26 (overweight)   Weight: 188 lb = BMI 27 (overweight)   Weight: 195 lb = BMI 28 (overweight)   Weight: 202 lb = BMI 29 (overweight)   Weight: 209 lb = BMI 30 (obese)   Weight: 216 lb = BMI 31 (obese)   Weight: 222 lb = BMI 32 (obese)   Weight: 229 lb = BMI 33 (obese)   Weight: 236 lb = BMI 34 (obese)   Weight: 243 lb = BMI 35 (obese)   Weight: 250 lb = BMI 36 (obese)   Weight: 257 lb = BMI 37 (obese)   Weight: 264 lb = BMI 38 (obese)   Weight: 271 lb = BMI 39 (obese)   Weight: 278 lb = BMI 40 (extreme obesity)   Weight: 285 lb = BMI 41 (extreme obesity)   Weight: 292 lb =  BMI 42 (extreme obesity)   Weight: 299 lb = BMI 43 (extreme obesity)   Weight: 306 lb = BMI 44 (extreme obesity)   Weight: 313 lb = BMI 45 (extreme obesity)   Weight: 320 lb = BMI 46 (extreme obesity)   Weight: 327 lb = BMI 47 (extreme  obesity)   Weight: 334 lb = BMI 48 (extreme obesity)   Weight: 341 lb = BMI 49 (extreme obesity)   Weight: 348 lb = BMI 50 (extreme obesity)   Weight: 355 lb = BMI 51 (extreme obesity)   Weight: 362 lb = BMI 52 (extreme obesity)   Weight: 369 lb = BMI 53 (extreme obesity)   Weight: 376 lb = BMI 54 (extreme obesity)  Height: 71 inches  Weight: 136 lb = BMI 19 (normal)   Weight: 143 lb = BMI 20 (normal)   Weight: 150 lb = BMI 21 (normal)   Weight: 157 lb = BMI 22 (normal)   Weight: 165 lb = BMI 23 (normal)   Weight: 172 lb = BMI 24 (normal)   Weight: 179 lb = BMI 25 (overweight)   Weight: 186 lb = BMI 26 (overweight)   Weight: 193 lb = BMI 27 (overweight)   Weight: 200 lb = BMI 28 (overweight)   Weight: 208 lb = BMI 29 (overweight)   Weight: 215 lb = BMI 30 (obese)   Weight: 222 lb = BMI 31 (obese)   Weight: 229 lb = BMI 32 (obese)   Weight: 236 lb = BMI 33 (obese)   Weight: 243 lb = BMI 34 (obese)   Weight: 250 lb = BMI 35 (obese)   Weight: 257 lb = BMI 36 (obese)   Weight: 265 lb = BMI 37 (obese)   Weight: 272 lb = BMI 38 (obese)   Weight: 279 lb = BMI 39 (obese)   Weight: 286 lb = BMI 40 (extreme obesity)   Weight: 293 lb = BMI 41 (extreme obesity)   Weight: 301 lb = BMI 42 (extreme obesity)   Weight: 308 lb = BMI 43 (extreme obesity)   Weight: 315 lb = BMI 44 (extreme obesity)   Weight: 322 lb = BMI 45 (extreme obesity)   Weight: 329 lb = BMI 46 (extreme obesity)   Weight: 338 lb = BMI 47 (extreme obesity)   Weight: 343 lb = BMI 48 (extreme obesity)   Weight: 351 lb = BMI 49 (extreme obesity)   Weight: 358 lb = BMI 50 (extreme obesity)   Weight: 365 lb = BMI 51 (extreme obesity)   Weight:  372 lb = BMI 52 (extreme obesity)   Weight: 379 lb = BMI 53 (extreme obesity)   Weight: 386 lb = BMI 54 (extreme obesity)  Height: 72 inches  Weight: 140 lb = BMI 19 (normal)   Weight: 147 lb = BMI 20 (normal)   Weight: 154 lb = BMI 21 (normal)   Weight: 162 lb = BMI 22 (normal)   Weight: 169 lb = BMI 23 (normal)   Weight: 177 lb = BMI 24 (normal)   Weight: 184 lb = BMI 25 (overweight)   Weight: 191 lb = BMI 26 (overweight)   Weight: 199 lb = BMI 27 (overweight)   Weight: 206 lb = BMI 28 (overweight)   Weight: 213 lb = BMI 29 (overweight)   Weight: 221 lb = BMI 30 (obese)   Weight: 228 lb = BMI 31 (obese)   Weight: 235 lb = BMI 32 (obese)   Weight: 242 lb = BMI 33 (obese)   Weight: 250 lb = BMI 34 (obese)   Weight: 258 lb = BMI 35 (obese)   Weight: 265 lb = BMI 36 (obese)   Weight: 272 lb = BMI 37 (obese)   Weight: 279 lb = BMI 38 (obese)   Weight:  287 lb = BMI 39 (obese)   Weight: 294 lb = BMI 40 (extreme obesity)   Weight: 302 lb = BMI 41 (extreme obesity)   Weight: 309 lb = BMI 42 (extreme obesity)   Weight: 316 lb = BMI 43 (extreme obesity)   Weight: 324 lb = BMI 44 (extreme obesity)   Weight: 331 lb = BMI 45 (extreme obesity)   Weight: 338 lb = BMI 46 (extreme obesity)   Weight: 346 lb = BMI 47 (extreme obesity)   Weight: 353 lb = BMI 48 (extreme obesity)   Weight: 361 lb = BMI 49 (extreme obesity)   Weight: 368 lb = BMI 50 (extreme obesity)   Weight: 375 lb = BMI 51 (extreme obesity)   Weight: 383 lb = BMI 52 (extreme obesity)   Weight: 390 lb = BMI 53 (extreme obesity)   Weight: 397 lb = BMI 54 (extreme obesity)  Height: 73 inches  Weight: 144 lb = BMI 19 (normal)   Weight: 151 lb = BMI 20 (normal)   Weight: 159 lb = BMI 21 (normal)   Weight: 166 lb = BMI 22 (normal)   Weight: 174 lb = BMI 23 (normal)   Weight: 182 lb = BMI 24 (normal)   Weight: 189 lb = BMI 25 (overweight)   Weight: 197 lb = BMI 26 (overweight)    Weight: 204 lb = BMI 27 (overweight)   Weight: 212 lb = BMI 28 (overweight)   Weight: 219 lb = BMI 29 (overweight)   Weight: 227 lb = BMI 30 (obese)   Weight: 235 lb = BMI 31 (obese)   Weight: 242 lb = BMI 32 (obese)   Weight: 250 lb = BMI 33 (obese)   Weight: 257 lb = BMI 34 (obese)   Weight: 265 lb = BMI 35 (obese)   Weight: 272 lb = BMI 36 (obese)   Weight: 280 lb = BMI 37 (obese)   Weight: 288 lb = BMI 38 (obese)   Weight: 295 lb = BMI 39 (obese)   Weight: 302 lb = BMI 40 (extreme obesity)   Weight: 310 lb = BMI 41 (extreme obesity)   Weight: 318 lb = BMI 42 (extreme obesity)   Weight: 325 lb = BMI 43 (extreme obesity)   Weight: 333 lb = BMI 44 (extreme obesity)   Weight: 340 lb = BMI 45 (extreme obesity)   Weight: 348 lb = BMI 46 (extreme obesity)   Weight: 355 lb = BMI 47 (extreme obesity)   Weight: 363 lb = BMI 48 (extreme obesity)   Weight: 371 lb = BMI 49 (extreme obesity)   Weight: 378 lb = BMI 50 (extreme obesity)   Weight: 386 lb = BMI 51 (extreme obesity)   Weight: 393 lb = BMI 52 (extreme obesity)   Weight: 401 lb = BMI 53 (extreme obesity)   Weight: 408 lb = BMI 54 (extreme obesity)  Height: 74 inches  Weight: 148 lb = BMI 19 (normal)   Weight: 155 lb = BMI 20 (normal)   Weight: 163 lb = BMI 21 (normal)   Weight: 171 lb = BMI 22 (normal)   Weight: 179 lb = BMI 23 (normal)   Weight: 186 lb = BMI 24 (normal)   Weight: 194 lb = BMI 25 (overweight)   Weight: 202 lb = BMI 26 (overweight)   Weight: 210 lb = BMI 27 (overweight)   Weight: 218 lb = BMI 28 (overweight)   Weight: 225 lb = BMI 29 (  overweight)   Weight: 233 lb = BMI 30 (obese)   Weight: 241 lb = BMI 31 (obese)   Weight: 249 lb = BMI 32 (obese)   Weight: 256 lb = BMI 33 (obese)   Weight: 264 lb = BMI 34 (obese)   Weight: 272 lb = BMI 35 (obese)   Weight: 280 lb = BMI 36 (obese)   Weight: 287 lb = BMI 37 (obese)   Weight: 295 lb = BMI 38 (obese)    Weight: 303 lb = BMI 39 (obese)   Weight: 311 lb = BMI 40 (extreme obesity)   Weight: 319 lb = BMI 41 (extreme obesity)   Weight: 326 lb = BMI 42 (extreme obesity)   Weight: 334 lb = BMI 43 (extreme obesity)   Weight: 342 lb = BMI 44 (extreme obesity)   Weight: 350 lb = BMI 45 (extreme obesity)   Weight: 358 lb = BMI 46 (extreme obesity)   Weight: 365 lb = BMI 47 (extreme obesity)   Weight: 373 lb = BMI 48 (extreme obesity)   Weight: 381 lb = BMI 49 (extreme obesity)   Weight: 389 lb = BMI 50 (extreme obesity)   Weight: 396 lb = BMI 51 (extreme obesity)   Weight: 404 lb = BMI 52 (extreme obesity)   Weight: 412 lb = BMI 53 (extreme obesity)   Weight: 420 lb = BMI 54 (extreme obesity)  Height: 75 inches  Weight: 152 lb = BMI 19 (normal)   Weight: 160 lb = BMI 20 (normal)   Weight: 168 lb = BMI 21 (normal)   Weight: 176 lb = BMI 22 (normal)   Weight: 184 lb = BMI 23 (normal)   Weight: 192 lb = BMI 24 (normal)   Weight: 200 lb = BMI 25 (overweight)   Weight: 208 lb = BMI 26 (overweight)   Weight: 216 lb = BMI 27 (overweight)   Weight: 224 lb = BMI 28 (overweight)   Weight: 232 lb = BMI 29 (overweight)   Weight: 240 lb = BMI 30 (obese)   Weight: 248 lb = BMI 31 (obese)   Weight: 256 lb = BMI 32 (obese)   Weight: 264 lb = BMI 33 (obese)   Weight: 272 lb = BMI 34 (obese)   Weight: 279 lb = BMI 35 (obese)   Weight: 287 lb = BMI 36 (obese)   Weight: 295 lb = BMI 37 (obese)   Weight: 303 lb = BMI 38 (obese)   Weight: 311 lb = BMI 39 (obese)   Weight: 319 lb = BMI 40 (extreme obesity)   Weight: 327 lb = BMI 41 (extreme obesity)   Weight: 335 lb = BMI 42 (extreme obesity)   Weight: 343 lb = BMI 43 (extreme obesity)   Weight: 351 lb = BMI 44 (extreme obesity)   Weight: 359 lb = BMI 45 (extreme obesity)   Weight: 367 lb = BMI 46 (extreme obesity)   Weight: 375 lb = BMI 47 (extreme obesity)   Weight: 383 lb = BMI 48 (extreme obesity)    Weight: 391 lb = BMI 49 (extreme obesity)   Weight: 399 lb = BMI 50 (extreme obesity)   Weight: 407 lb = BMI 51 (extreme obesity)   Weight: 415 lb = BMI 52 (extreme obesity)   Weight: 423 lb = BMI 53 (extreme obesity)   Weight: 431 lb = BMI 54 (extreme obesity)  Height: 76 inches  Weight: 156 lb = BMI 19 (normal)   Weight:  164 lb = BMI 20 (normal)   Weight: 172 lb = BMI 21 (normal)   Weight: 180 lb = BMI 22 (normal)   Weight: 189 lb = BMI 23 (normal)   Weight: 197 lb = BMI 24 (normal)   Weight: 205 lb = BMI 25 (overweight)   Weight: 213 lb = BMI 26 (overweight)   Weight: 221 lb = BMI 27 (overweight)   Weight: 230 lb = BMI 28 (overweight)   Weight: 238 lb = BMI 29 (overweight)   Weight: 246 lb = BMI 30 (obese)   Weight: 254 lb = BMI 31 (obese)   Weight: 263 lb = BMI 32 (obese)   Weight: 271 lb = BMI 33 (obese)   Weight: 279 lb = BMI 34 (obese)   Weight: 287 lb = BMI 35 (obese)   Weight: 295 lb = BMI 36 (obese)   Weight: 304 lb = BMI 37 (obese)   Weight: 312 lb = BMI 38 (obese)   Weight: 320 lb = BMI 39 (obese)   Weight: 328 lb = BMI 40 (extreme obesity)   Weight: 336 lb = BMI 41 (extreme obesity)   Weight: 344 lb = BMI 42 (extreme obesity)   Weight: 353 lb = BMI 43 (extreme obesity)   Weight: 361 lb = BMI 44 (extreme obesity)   Weight: 369 lb = BMI 45 (extreme obesity)   Weight: 377 lb = BMI 46 (extreme obesity)   Weight: 385 lb = BMI 47 (extreme obesity)   Weight: 394 lb = BMI 48 (extreme obesity)   Weight: 402 lb = BMI 49 (extreme obesity)   Weight: 410 lb = BMI 50 (extreme obesity)   Weight: 418 lb = BMI 51 (extreme obesity)   Weight: 426 lb = BMI 52 (extreme obesity)   Weight: 435 lb = BMI 53 (extreme obesity)   Weight: 443 lb = BMI 54 (extreme obesity)  Source: Adapted from Clinical Guidelines on the Identification, Evaluation, and Treatment of Overweight and Obesity in Adults: The Evidence Report. HEALTH RISK CLASSIFICATION  ACCORDING TO BODY MASS INDEX (BMI) Classification: Underweight.  BMI Category:  less than 18.5   Risk of developing health problems: Increased.  Classification: Normal Weight.  BMI Category: 18.5 to 24.9   Risk of developing health problems: Least.  Classification: Overweight.  BMI Category: 25.0 to 29.9   Risk of developing health problems: Increased.  Classification: Obese class.  BMI Category:  30.0 to 34.9   Risk of developing health problems: High.  Classification: Obese class II.  BMI Category:  35.0 to 39.9   Risk of developing health problems: Very high.  Classification: Obese class III.  BMI Category: 40.0 or more   Risk of developing health problems: Extremely high.  Note: For persons 2 years and older the 'normal' range may begin slightly above BMI 18.5 and extend into the 'overweight' range.   To clarify risk for each individual, other factors also need to be considered, such as:   Lifestyle habits.   Fitness level.   Presence or absence of other health risk conditions.   The classification system may underestimate or overestimate health risks in certain adults, such as:   Highly muscular adults. Very muscular adults, such as athletes, may have a low percentage of body fat but a large amount of muscle tissue. This can result in a BMI in the overweight range that may over estimate the risk of developing health problems.   Adults who naturally have a very lean body  build.   Young adults who have not reached full growth.   Adults over 90 years of age. For adults over age 54, more research is needed to determine if the cut-off points for the 'normal weight' range differ in any way from those for younger adults.   It is also important to note that BMI is only one part of a health risk assessment. To further clarify risk, other factors need to be considered as well.   Age, inherited traits, presence or absence of other conditions such as diabetes, high  blood lipids, hypertension, and high blood glucose levels also influence the development of diseases associated with overweight. Risk factors such as poor eating habits, physical inactivity, and tobacco use can play a role in the development of diseases associated with both overweight and underweight.  Consult a caregiver for a more complete assessment of your weight as it relates to health risk. It is important to discuss with your caregiver what BMI means for you as an individual. Maintaining a 'normal weight' is one element of good health. However, unhealthy eating habits, low levels of physical activity and tobacco use will increase the risk of health problems even for those within the normal weight range.  Being overweight indicates some risk to health. But research suggests that regular physical activity can decrease the risk of several health problems. Equally, a nutritious diet has been shown to decrease some of the risks associated with overweight. It is important to emphasize that a weight classification system is but one tool to assess health risks in individuals.  Document Released: 01/08/2004 Document Revised: 04/17/2011 Document Reviewed: 02/05/2005 [facility] Patient Information 2012 [place], [place].

## 2011-11-05 NOTE — Progress Notes (Signed)
[facility] ED CM noted CM consult for trouble with affording medications. CM assessed pt in ED rm #3 Pt states she generally is not having issues with most of her medications which cost $5 Reports having concerns with inhalers, Proventil and symbicort Reports inhalers x 2 for her and then x 1 for her daughter costs $75/month CM reviewed Pcp office samples which pt states she has obtained at intervals CM reviewed [email or web address] for patient assistance programs and provided with written information for this resource Female family member in her room began accessing site on Internet as [facility] attending arrived to evaluate pt

## 2011-11-05 NOTE — Telephone Encounter (Signed)
I noticed patient in elevator around 133pm going to Medical Records/[facility]. Pt looked unsteady in her gait and as though she was not feeling well. I asked patient if she was feeling okay and pt stated that she was dizzy. I assisted patient to the closest chair in Medical Records to have a seat and advise the employees that the pt was slightly dizzy and needed to sit for a while. Pt stated she would be fine and thanks for the help. Pt appeared to be in no acute distress, I therefore advised the employees to call our floor if assistance was needed.   I returned to the building though the front entrance around 2pm and noticed the patient was sitting in the front lobby having trouble with her steadiness and breathing. I then asked the patient if she was feeling any better. The patient responded stating she was still dizzy, SOB, and having slight center chest pain-not radiating anywhere. I had assistance from another CMA [place] Abts) in getting the patient into a wheelchair and brought up to 2nd floor to assess. Pt's vitals were checked around 2:05pm and are as follows: B/P: 138/98; P: 104, O2 on RA: 97%; Temp: 97.87F; glucose: 144. Pt verbalized she was not a diabetic and had just eaten Mrs. Winner[employer] fried chicken, fries, and a biscuit with her mother about 20 minutes earlier.   Pt c/o dizziness, SOB, center chest pain-no raidiating, light headed, and seeing spots. Pt is followed by Sandford Craze for Primary and M. Ramaswamy for Pulmonary.Pt stated she no longer had any symptoms; I therefore spoke with MR  who agreed that patient should go to ER via EMS; pt agreed and EMS was called; arrived at 2:36pm and patient was taken to ER-pt was still having dizziness upon standing once EMS arrived.   Will forward note to MR in have on record.

## 2011-11-05 NOTE — ED Notes (Signed)
RUE:[id number]<JW> Expected date:<BR> Expected time:<BR> Means of arrival:<BR> Comments:<BR> ems from [facility]

## 2011-11-05 NOTE — Telephone Encounter (Signed)
Thanks

## 2011-11-05 NOTE — Consult Note (Signed)
History and Physical  Amber Salas:[id number] DOB: August 02, 1975 DOA: 11/05/2011  Referring physician: PCP: Lemont Fillers., NP   Chief Complaint: Dizziness, shortness of breath and slight chest heaviness, seeing spots   HPI:  36 yr old AAF, went to Pulmonary office to get Short term disability paperwork filled out.  PAtient got upset with regards to this and in the process became lightheaded and also was somehwat woozy.  VS were done and blood pressure and sugar was a little high.  She repiorted more tightness in her chest than actual pain.  NO radiation of the "tightness" down her arm.  Still does have tightness in her chest.  Has been ambulant here without issue.  No breathing Tx were trialed, she was given 4 asa's en route.  No current pain-she states instead this is tightness.  She states she last used her nebuliser on Thursday or Friday of last week with good effect.  Compliant on symbicort bid and Proventil for flatres-has not been using this more than usual. Does feel somewhat smothered but denies -this is constant. She states she's had this issue for while and this feels more like the discomfort that she experiences with her chronic asthma.  ED course: Patient's urinalysis was negative, chest x-ray showed no acute findings, point-of-care troponin is negative.  Chart Reviewed in detail  Review of Systems:  No blurred vision no double vision no headache no weakness in any one side of body no falls no dysuria no fever no chills No cough no cold no rhinorrhea no abdominal pain no dark stool no tarry stool no blood in vomit and no vomit  Past Medical History  Diagnosis Date  . Asthma     history of  . Murmur, cardiac   . Retinal hole or tear     left eye- 1996 due to injury  . Obstructive sleep apnea on CPAP   . Morbid obesity   . Arrhythmia     Past Surgical History  Procedure Date  . Cesarean section 4/04  . Hernia repair 04/2009    abdominal hernia  . Wisdom  tooth extraction 1999  . Eye surgery 11/2010    lasar repair of retinal tear  . Tubal ligation     Social History:  reports that she has never smoked. She has never used smokeless tobacco. She reports that she does not drink alcohol or use illicit drugs.  Allergies  Allergen Reactions  . Fluticasone-Salmeterol Palpitations and Other (See Comments)    chest pain  . Latex Rash and Other (See Comments)    wheezing  . Spinach Rash    Family History  Problem Relation Age of Onset  . Hypertension Mother   . Diabetes Mother     type II  . Other Mother     cervical dysplasia  . Hypertension Father   . Bipolar disorder Father   . Heart disease Maternal Grandmother   . Hypertension Maternal Grandmother   . Bipolar disorder Maternal Grandmother   . Hypertension Maternal Grandfather   . Hypertension Paternal Grandmother   . Hypertension Paternal Grandfather   . Asthma Son     x2     Prior to Admission medications   Medication Sig Start Date End Date Taking? Authorizing Provider  albuterol (PROVENTIL HFA;VENTOLIN HFA) 108 (90 BASE) MCG/ACT inhaler Inhale 2 puffs into the lungs every 4 (four) hours as needed. For shortness of breath or wheezing   Yes Historical Provider, MD  albuterol (PROVENTIL) (2.5 MG/3ML) 0.083%  nebulizer solution Take 3 mLs (2.5 mg total) by nebulization every 4 (four) hours as needed. For shortness of breath or wheezing 10/21/11  Yes Sandford Craze, NP  budesonide-formoterol (SYMBICORT) 160-4.5 MCG/ACT inhaler Inhale 2 puffs into the lungs 2 (two) times daily. 12/18/10 12/18/11 Yes Sandford Craze, NP  cetirizine (ZYRTEC) 10 MG tablet Take 10 mg by mouth daily.   Yes Historical Provider, MD  fluticasone (FLONASE) 50 MCG/ACT nasal spray Place 2 sprays into the nose daily. 10/29/11 10/28/12 Yes Kalman Shan, MD  ibuprofen (ADVIL,MOTRIN) 200 MG tablet Take 400 mg by mouth once as needed. For pain   Yes Historical Provider, MD  Multiple Vitamin (MULITIVITAMIN WITH  MINERALS) TABS Take 1 tablet by mouth daily.   Yes Historical Provider, MD  Olopatadine HCl (PATADAY) 0.2 % SOLN Place 1 drop into both eyes as needed. For allergies   Yes Historical Provider, MD  pantoprazole (PROTONIX) 40 MG tablet Take 1 tablet (40 mg total) by mouth daily. On an empty stomach 10/29/11 10/28/12 Yes Kalman Shan, MD  predniSONE (DELTASONE) 10 MG tablet Take 20 mg by mouth daily. For 7-10 days for asthma attack 10/23/11  Yes Loren Racer, MD  montelukast (SINGULAIR) 10 MG tablet Take 1 tablet (10 mg total) by mouth at bedtime. 10/21/11 10/20/12  Sandford Craze, NP   Physical Exam: Filed Vitals:   11/05/11 1517 11/05/11 1651 11/05/11 1654 11/05/11 1655  BP: 139/70 130/67 143/87 144/93  Pulse: 86 76 80 82  Temp: 98.3 F (36.8 C)     TempSrc: Oral     Resp: 24     SpO2: 98%       = Present alert morbidly obese Caucasian female in no apparent distress Chest clinically clear no added sound no wheeze no tactile vocal resonance or fremitus Abdomen soft obese nontender nondistended Patient does not experience pain on pressure over the left-sided chest but does feel some discomfort which is reproducible Neurologically grossly intact   Labs on Admission:  Basic Metabolic Panel:  Lab 11/05/11 1478  NA 139  K 4.4  CL 106  CO2 23  GLUCOSE 116*  BUN 15  CREATININE 0.75  CALCIUM 9.0  MG --  PHOS --    Liver Function Tests:  Lab 11/05/11 1550  AST 11  ALT 15  ALKPHOS 73  BILITOT 0.2*  PROT 7.7  ALBUMIN 3.4*   No results found for this basename: LIPASE:5,AMYLASE:5 in the last 168 hours No results found for this basename: AMMONIA:5 in the last 168 hours  CBC:  Lab 11/05/11 1550  WBC 16.3*  NEUTROABS 13.4*  HGB 12.7  HCT 38.2  MCV 86.2  PLT 357    Cardiac Enzymes: No results found for this basename: CKTOTAL:5,CKMB:5,CKMBINDEX:5,TROPONINI:5 in the last 168 hours  Troponin (Point of Care Test)  [facility] 11/05/11 1555  TROPIPOC 0.01    BNP  (last 3 results)  Basename 08/27/11 1445  PROBNP 17.2    CBG: No results found for this basename: GLUCAP:5 in the last 168 hours   Radiological Exams on Admission: Dg Chest 2 View  11/05/2011  *RADIOLOGY REPORT*  Clinical Data: Chest tightness, shortness of breath and cough.  CHEST - 2 VIEW  Comparison: 10/23/2011.  Findings: Trachea is midline.  Heart size normal.  Lungs are clear. Added density over the lower thoracic spine on the lateral view is thought to be due to superimposed adipose tissue, as there does not appear to be a correlate on the frontal view.  No pleural fluid.  IMPRESSION: No acute findings.  Original Report Authenticated By: Reyes Ivan, M.D.    EKG: Independently reviewed. Sinus rhythm PR interval 0.12 some subtle T-wave inversion V4 and 5 which is less than 1 mm. No reciprocal changes in lead #2 or lead #3    Active Problems:  * No active hospital problems. *     Assessment/Plan 1. Patient has a TIMI score of less than 5% for 14 day mortality from MI. I see no reason that she needs to be kept inpatient, although she does have some family risk factors Her symptoms seem to totally resolved at pulmonologists office and she actually is ambulated in the halls here without any morphine, nitroglycerin or other directed therapies for unstable angina. I feel comfortable discharging her home after an extensive discussion with her as to the risk factor- 4 example crushing decrescendo chest pain, pain running down the arm, pain going into jaw, lasting for 20 minutes prompted by exertion. I have prompted her to speak to her primary care physician in 2-3 days and I will forward a copy of this to both pulmonologist as well as her primary care physician.  Code Status: Full Family Communication: I discussed this with family in the room, I also discussed this with Drucie Opitz, PA Disposition Plan: Home followup with regular physician in one to 2 days  Pleas Koch, MD  [facility] Pager [phone]  If 8PM-8AM, please contact floor/night-coverage at [email or web address], password [facility] 11/05/2011, 6:18 PM

## 2011-11-05 NOTE — ED Provider Notes (Signed)
History     CSN: [id number]  Arrival date & time 11/05/11  1505   First MD Initiated Contact with Patient 11/05/11 1606      Chief Complaint  Patient presents with  . Chest Pain    (Consider location/radiation/quality/duration/timing/severity/associated sxs/prior treatment) HPI  Patient who is morbidly obese who has asthma, sleep apnea, history of arrhythmia, and heart murmur presents the emergency department by EMS after she was at [facility] pulmonology where she was attempting to fill out short-term disability paperwork for her asthma and sleep apnea and became acutely short of breath with some chest tightness and feeling lightheaded and flushed. Patient states that she had been going down from the basement floor of the building back up to the offices multiple times to get paperwork filed. Patient patient states that she was very angry with the medical staff because of the delay and the paperwork and on one of her trips down elevator began to feel hot, and flushed and began to feel lightheaded. Patient states there were some nurses in the elevator and they sat her down. Patient states that she had shortness of breath and some tightness in her chest that she states is similar to her hx of asthma symptoms. Patient states all of the symptoms have resolved by time of evaluation. She states things improved when she left the office and got to the ER. Patient states she's had similar symptoms in the past. Denies fevers, chills, cough, visual changes, n/v, extremity numbness/tingling/weakness.   Past Medical History  Diagnosis Date  . Asthma     history of  . Murmur, cardiac   . Retinal hole or tear     left eye- 1996 due to injury  . Obstructive sleep apnea on CPAP   . Morbid obesity   . Arrhythmia     Past Surgical History  Procedure Date  . Cesarean section 4/04  . Hernia repair 04/2009    abdominal hernia  . Wisdom tooth extraction 1999  . Eye surgery 11/2010    lasar repair of  retinal tear  . Tubal ligation     Family History  Problem Relation Age of Onset  . Hypertension Mother   . Diabetes Mother     type II  . Other Mother     cervical dysplasia  . Hypertension Father   . Bipolar disorder Father   . Heart disease Maternal Grandmother   . Hypertension Maternal Grandmother   . Bipolar disorder Maternal Grandmother   . Hypertension Maternal Grandfather   . Hypertension Paternal Grandmother   . Hypertension Paternal Grandfather   . Asthma Son     x2    History  Substance Use Topics  . Smoking status: Never Smoker   . Smokeless tobacco: Never Used  . Alcohol Use: No    OB History    Grav Para Term Preterm Abortions TAB SAB Ect Mult Living                  Review of Systems  All other systems reviewed and are negative.    Allergies  Fluticasone-salmeterol; Latex; and Spinach  Home Medications   Current Outpatient Rx  Name Route Sig Dispense Refill  . ALBUTEROL SULFATE HFA 108 (90 BASE) MCG/ACT IN AERS Inhalation Inhale 2 puffs into the lungs every 4 (four) hours as needed. For shortness of breath or wheezing    . ALBUTEROL SULFATE (2.5 MG/3ML) 0.083% IN NEBU Nebulization Take 3 mLs (2.5 mg total) by nebulization every 4 (  four) hours as needed. For shortness of breath or wheezing 75 mL 5  . BUDESONIDE-FORMOTEROL FUMARATE 160-4.5 MCG/ACT IN AERO Inhalation Inhale 2 puffs into the lungs 2 (two) times daily. 1 Inhaler 5  . CETIRIZINE HCL 10 MG PO TABS Oral Take 10 mg by mouth daily.    [name] FLUTICASONE PROPIONATE 50 MCG/ACT NA SUSP Nasal Place 2 sprays into the nose daily. 16 g 2  . IBUPROFEN 200 MG PO TABS Oral Take 400 mg by mouth once as needed. For pain    . ADULT MULTIVITAMIN W/MINERALS CH Oral Take 1 tablet by mouth daily.    . OLOPATADINE HCL 0.2 % OP SOLN Both Eyes Place 1 drop into both eyes as needed. For allergies    . PANTOPRAZOLE SODIUM 40 MG PO TBEC Oral Take 1 tablet (40 mg total) by mouth daily. On an empty stomach 30 tablet 6    . PREDNISONE 10 MG PO TABS Oral Take 20 mg by mouth daily. For 7-10 days for asthma attack    . MONTELUKAST SODIUM 10 MG PO TABS Oral Take 1 tablet (10 mg total) by mouth at bedtime. 30 tablet 3    BP 139/70  Pulse 86  Temp 98.3 F (36.8 C) (Oral)  Resp 24  SpO2 98%  LMP 11/01/2011  Physical Exam  Nursing note and vitals reviewed. Constitutional: She is oriented to person, place, and time. She appears well-developed and well-nourished. No distress.       Morbidly obese  HENT:  Head: Normocephalic and atraumatic.  Eyes: Conjunctivae and EOM are normal. Pupils are equal, round, and reactive to light.       No nystagmus.   Neck: Normal range of motion. Neck supple.  Cardiovascular: Normal rate, regular rhythm, normal heart sounds and intact distal pulses.  Exam reveals no gallop and no friction rub.   No murmur heard. Pulmonary/Chest: Effort normal and breath sounds normal. No respiratory distress. She has no wheezes. She has no rales. She exhibits no tenderness.  Abdominal: Soft. Bowel sounds are normal. She exhibits no distension and no mass. There is no tenderness. There is no rebound and no guarding.  Musculoskeletal: Normal range of motion. She exhibits no edema and no tenderness.  Neurological: She is alert and oriented to person, place, and time. No cranial nerve deficit. Coordination normal.       Ambulating without difficulty. No ataxia. Denies dizziness with ambulating.   Skin: Skin is warm and dry. No rash noted. She is not diaphoretic. No erythema.  Psychiatric: She has a normal mood and affect.    ED Course  Procedures (including critical care time)   Date: 11/05/2011  Rate: 82  Rhythm: normal sinus rhythm  QRS Axis: normal  Intervals: normal  ST/T Wave abnormalities: flipped T waves diffusely  Conduction Disutrbances:none  Narrative Interpretation:   Old EKG Reviewed: flipped T waves compared to October 14, 2011   Labs Reviewed  CBC WITH DIFFERENTIAL -  Abnormal; Notable for the following:    WBC 16.3 (*)     Neutrophils Relative 82 (*)     Neutro Abs 13.4 (*)     All other components within normal limits  COMPREHENSIVE METABOLIC PANEL - Abnormal; Notable for the following:    Glucose, Bld 116 (*)     Albumin 3.4 (*)     Total Bilirubin 0.2 (*)     All other components within normal limits  POCT I-STAT TROPONIN I  URINALYSIS, ROUTINE W REFLEX MICROSCOPIC  PREGNANCY,  URINE  D-DIMER, QUANTITATIVE   Dg Chest 2 View  11/05/2011  *RADIOLOGY REPORT*  Clinical Data: Chest tightness, shortness of breath and cough.  CHEST - 2 VIEW  Comparison: 10/23/2011.  Findings: Trachea is midline.  Heart size normal.  Lungs are clear. Added density over the lower thoracic spine on the lateral view is thought to be due to superimposed adipose tissue, as there does not appear to be a correlate on the frontal view.  No pleural fluid.  IMPRESSION: No acute findings.  Original Report Authenticated By: Reyes Ivan, M.D.   Reviewed with Dr. Clarene Duke with patient remaining symptom free but concern for flipped T waves diffusely compared to October 14, 2011  6:00 PM Dr. Mahala Menghini consulted for consideration of admission.   1. Chest pain   2. Morbid obesity   3. Asthma       MDM  Patient was evaluated by triad hospitalist, Dr. Burnett Harry, for consideration of admission. After Samtani evaluated patient he would like to discharge patient home bleeding that she is low-risk for acute coronary syndrome. He spoke at length with patient changing or worsening symptoms that should prompt immediate return to emergency department. I to spoke with patient about changing or worsening symptoms prompt immediate return to emergency department. Patient voices her understanding and is agreeable plan. She's been asymptomatic throughout ER stay.        [place], [place] 11/05/11 1906

## 2011-11-05 NOTE — ED Notes (Signed)
Denies chest pain at present, just tight, denies shortness of breath at present. Feels lightheaded not dizzy, states "room is leaning"

## 2011-11-05 NOTE — Progress Notes (Signed)
ED CM reviewed needymeds.com and noted Proventil $15 discount and symbicort patient assistance program.  CM reviewed these with the patient and discussed having her to check with [employer] managed medication tier levels to see if there is an available inhaler on a lower tier that she can recommend to her provider to decrease her cost Pt will be able to access internet or toll free [employer] numbers to receive these Pt and family verbalized understanding and appreciation of resources and services offered.

## 2011-11-07 ENCOUNTER — Ambulatory Visit (INDEPENDENT_AMBULATORY_CARE_PROVIDER_SITE_OTHER): Payer: Managed Care, Other (non HMO) | Admitting: Internal Medicine

## 2011-11-07 DIAGNOSIS — R0989 Other specified symptoms and signs involving the circulatory and respiratory systems: Secondary | ICD-10-CM

## 2011-11-07 DIAGNOSIS — R0600 Dyspnea, unspecified: Secondary | ICD-10-CM

## 2011-11-07 DIAGNOSIS — R05 Cough: Secondary | ICD-10-CM

## 2011-11-07 LAB — PULMONARY FUNCTION TEST

## 2011-11-07 NOTE — Progress Notes (Signed)
PFT done today. 

## 2011-11-08 NOTE — ED Provider Notes (Signed)
Medical screening examination/treatment/procedure(s) were performed by non-physician practitioner and as supervising physician I was immediately available for consultation/collaboration.   Laray Anger, DO 11/08/11 1257

## 2011-11-10 ENCOUNTER — Telehealth: Payer: Self-pay | Admitting: Internal Medicine

## 2011-11-10 ENCOUNTER — Encounter: Payer: Self-pay | Admitting: *Deleted

## 2011-11-10 NOTE — Telephone Encounter (Signed)
Spoke with the pt and she states she does not feel she has had much if any improvement in her cough. I reviewed recs with her from OV and she states she is following all advice. Pt is concerned that returning to work will only make cough worse since she has to talk a lot at work. She was advised to take 2 weeks off from work from last OV. Pt is set to returning to work tomorrow. Pt has fu appt with TP on 11-19-11. Should the pt return to work tomorrow or can she wait until after OV on 11-19-11. Please advise. Carron Curie, CMA

## 2011-11-10 NOTE — Telephone Encounter (Signed)
LMTCBx1, per pt instruct from last OV she was to take 2 weeks of work sue to cough and then return. Is cough improved? Carron Curie, CMA

## 2011-11-10 NOTE — Telephone Encounter (Signed)
Pt advised and letter faxed to [employer] at [phone]. Carron Curie, CMA

## 2011-11-10 NOTE — Telephone Encounter (Signed)
Off work till 11/19/11 but needs to do another 3 day voice rest without talking or whisperin. Tammy to decide about return to work

## 2011-11-11 ENCOUNTER — Encounter (HOSPITAL_COMMUNITY): Payer: Self-pay | Admitting: *Deleted

## 2011-11-11 ENCOUNTER — Emergency Department (HOSPITAL_COMMUNITY): Payer: Managed Care, Other (non HMO)

## 2011-11-11 ENCOUNTER — Emergency Department (HOSPITAL_COMMUNITY)
Admission: EM | Admit: 2011-11-11 | Discharge: 2011-11-12 | Disposition: A | Discharge status: Home / Self Care | Payer: Managed Care, Other (non HMO) | Attending: Emergency Medicine | Admitting: Emergency Medicine

## 2011-11-11 DIAGNOSIS — J45909 Unspecified asthma, uncomplicated: Secondary | ICD-10-CM | POA: Insufficient documentation

## 2011-11-11 DIAGNOSIS — R0602 Shortness of breath: Secondary | ICD-10-CM | POA: Insufficient documentation

## 2011-11-11 DIAGNOSIS — G4733 Obstructive sleep apnea (adult) (pediatric): Secondary | ICD-10-CM | POA: Insufficient documentation

## 2011-11-11 DIAGNOSIS — K219 Gastro-esophageal reflux disease without esophagitis: Secondary | ICD-10-CM

## 2011-11-11 LAB — CBC WITH DIFFERENTIAL/PLATELET
Basophils Absolute: 0 10*3/uL (ref 0.0–0.1)
Basophils Relative: 0 % (ref 0–1)
Eosinophils Relative: 4 % (ref 0–5)
HCT: 36.6 % (ref 36.0–46.0)
Hemoglobin: 12 g/dL (ref 12.0–15.0)
MCH: 28.3 pg (ref 26.0–34.0)
MCHC: 32.8 g/dL (ref 30.0–36.0)
MCV: 86.3 fL (ref 78.0–100.0)
Monocytes Absolute: 0.9 10*3/uL (ref 0.1–1.0)
Monocytes Relative: 8 % (ref 3–12)
Neutro Abs: 7.2 10*3/uL (ref 1.7–7.7)
RDW: 14.7 % (ref 11.5–15.5)

## 2011-11-11 LAB — COMPREHENSIVE METABOLIC PANEL
AST: 10 U/L (ref 0–37)
Albumin: 3.2 g/dL — ABNORMAL LOW (ref 3.5–5.2)
BUN: 14 mg/dL (ref 6–23)
Calcium: 8.8 mg/dL (ref 8.4–10.5)
Chloride: 103 mEq/L (ref 96–112)
Creatinine, Ser: 0.83 mg/dL (ref 0.50–1.10)
GFR calc non Af Amer: >90 mL/min (ref >90)
Total Bilirubin: 0.3 mg/dL (ref 0.3–1.2)

## 2011-11-11 LAB — LIPASE, BLOOD: Lipase: 48 U/L (ref 11–59)

## 2011-11-11 LAB — POCT I-STAT TROPONIN I: Troponin i, poc: 0 ng/mL (ref 0.00–0.08)

## 2011-11-11 MED ORDER — SODIUM CHLORIDE 0.9 % IV SOLN
Freq: Once | INTRAVENOUS | Status: AC
  Administered 2011-11-12: 01:00:00 via INTRAVENOUS

## 2011-11-11 MED ORDER — GI COCKTAIL ~~LOC~~
30.0000 mL | Freq: Once | ORAL | Status: AC
  Administered 2011-11-12: 30 mL via ORAL
  Filled 2011-11-11: qty 30

## 2011-11-11 MED ORDER — FAMOTIDINE 20 MG PO TABS
40.0000 mg | ORAL_TABLET | Freq: Once | ORAL | Status: AC
  Administered 2011-11-12: 40 mg via ORAL
  Filled 2011-11-11: qty 2

## 2011-11-11 NOTE — ED Provider Notes (Addendum)
History     CSN: [id number]  Arrival date & time 11/11/11  2118   First MD Initiated Contact with Patient 11/11/11 2303      Chief Complaint  Patient presents with  . Shortness of Breath    (Consider location/radiation/quality/duration/timing/severity/associated sxs/prior treatment) Patient is a 36 y.o. female presenting with shortness of breath. The history is provided by the patient and the spouse.  Shortness of Breath  Associated symptoms include shortness of breath.   patient here with shortness of breath it started after she was eating spaghetti described as burning sensation in her mid epigastric area without radiation. Denies any anginal type symptoms. Did have emesis times one and does flow better. Denies any prior history of gallbladder disease. Does have a history of shortness of breath due to asthma for which she seen a pulmonologist. No medications used prior to arrival.  Past Medical History  Diagnosis Date  . Asthma     history of  . Murmur, cardiac   . Retinal hole or tear     left eye- 1996 due to injury  . Obstructive sleep apnea on CPAP   . Morbid obesity   . Arrhythmia     Past Surgical History  Procedure Date  . Cesarean section 4/04  . Hernia repair 04/2009    abdominal hernia  . Wisdom tooth extraction 1999  . Eye surgery 11/2010    lasar repair of retinal tear  . Tubal ligation     Family History  Problem Relation Age of Onset  . Hypertension Mother   . Diabetes Mother     type II  . Other Mother     cervical dysplasia  . Hypertension Father   . Bipolar disorder Father   . Heart disease Maternal Grandmother   . Hypertension Maternal Grandmother   . Bipolar disorder Maternal Grandmother   . Hypertension Maternal Grandfather   . Hypertension Paternal Grandmother   . Hypertension Paternal Grandfather   . Asthma Son     x2    History  Substance Use Topics  . Smoking status: Never Smoker   . Smokeless tobacco: Never Used  . Alcohol  Use: No    OB History    Grav Para Term Preterm Abortions TAB SAB Ect Mult Living                  Review of Systems  Respiratory: Positive for shortness of breath.   All other systems reviewed and are negative.    Allergies  Fluticasone-salmeterol; Latex; and Spinach  Home Medications   Current Outpatient Rx  Name Route Sig Dispense Refill  . ALBUTEROL SULFATE HFA 108 (90 BASE) MCG/ACT IN AERS Inhalation Inhale 2 puffs into the lungs every 4 (four) hours as needed. For shortness of breath or wheezing    . ALBUTEROL SULFATE (2.5 MG/3ML) 0.083% IN NEBU Nebulization Take 3 mLs (2.5 mg total) by nebulization every 4 (four) hours as needed. For shortness of breath or wheezing 75 mL 5  . BUDESONIDE-FORMOTEROL FUMARATE 160-4.5 MCG/ACT IN AERO Inhalation Inhale 2 puffs into the lungs 2 (two) times daily. 1 Inhaler 5  . CETIRIZINE HCL 10 MG PO TABS Oral Take 10 mg by mouth daily.    [name] FLUTICASONE PROPIONATE 50 MCG/ACT NA SUSP Nasal Place 2 sprays into the nose daily. 16 g 2  . IBUPROFEN 200 MG PO TABS Oral Take 400 mg by mouth once as needed. For pain    . ADULT MULTIVITAMIN W/MINERALS CH Oral  Take 1 tablet by mouth daily.    . OLOPATADINE HCL 0.2 % OP SOLN Both Eyes Place 1 drop into both eyes as needed. For allergies    . PANTOPRAZOLE SODIUM 40 MG PO TBEC Oral Take 1 tablet (40 mg total) by mouth daily. On an empty stomach 30 tablet 6    BP 127/70  Pulse 88  Temp 98.6 F (37 C) (Oral)  Resp 21  SpO2 100%  LMP 11/01/2011  Physical Exam  Nursing note and vitals reviewed. Constitutional: She is oriented to person, place, and time. She appears well-developed and well-nourished.  Non-toxic appearance. No distress.  HENT:  Head: Normocephalic and atraumatic.  Eyes: Conjunctivae, EOM and lids are normal. Pupils are equal, round, and reactive to light.  Neck: Normal range of motion. Neck supple. No tracheal deviation present. No mass present.  Cardiovascular: Normal rate, regular  rhythm and normal heart sounds.  Exam reveals no gallop.   No murmur heard. Pulmonary/Chest: Effort normal and breath sounds normal. No stridor. No respiratory distress. She has no decreased breath sounds. She has no wheezes. She has no rhonchi. She has no rales.  Abdominal: Soft. Normal appearance and bowel sounds are normal. She exhibits no distension. There is tenderness in the epigastric area. There is no rebound and no CVA tenderness.  Musculoskeletal: Normal range of motion. She exhibits no edema and no tenderness.  Neurological: She is alert and oriented to person, place, and time. She has normal strength. No cranial nerve deficit or sensory deficit. GCS eye subscore is 4. GCS verbal subscore is 5. GCS motor subscore is 6.  Skin: Skin is warm and dry. No abrasion and no rash noted.  Psychiatric: She has a normal mood and affect. Her speech is normal and behavior is normal.    ED Course  Procedures (including critical care time)  Labs Reviewed  CBC WITH DIFFERENTIAL - Abnormal; Notable for the following:    WBC 11.5 (*)     All other components within normal limits  COMPREHENSIVE METABOLIC PANEL - Abnormal; Notable for the following:    Sodium 134 (*)     Glucose, Bld 107 (*)     Albumin 3.2 (*)     All other components within normal limits  LIPASE, BLOOD   No results found.   No diagnosis found.    MDM  Patient given medications for presumed acid reflux she does flow better. No concerns for ACS at this time.: Ultrasound negative suspect the patient has reflux.  Patient notes dyspnea as well 2. Chest x-ray negative. D-dimer is negative. No concern for PE    Date: 12/01/2011  Rate: 70  Rhythm: normal sinus rhythm  QRS Axis: normal  Intervals: normal  ST/T Wave abnormalities: normal  Conduction Disutrbances:none  Narrative Interpretation:   Old EKG Reviewed: none available      Toy Baker, MD 11/12/11 5784  Toy Baker, MD 11/12/11 0206  Toy Baker, MD 12/01/11 [phone]

## 2011-11-11 NOTE — ED Notes (Signed)
Pt states she was eatting and started to become sob. pts this was about a hour ago. Pt states she is having chest pain

## 2011-11-12 ENCOUNTER — Telehealth: Payer: Self-pay | Admitting: Internal Medicine

## 2011-11-12 ENCOUNTER — Emergency Department (HOSPITAL_COMMUNITY): Payer: Managed Care, Other (non HMO)

## 2011-11-12 MED ORDER — SUCRALFATE 1 G PO TABS: 1.0000 g | ORAL_TABLET | Freq: Four times a day (QID) | ORAL | Status: DC

## 2011-11-12 NOTE — Telephone Encounter (Signed)
Week 2-9 off is full pay then after that is part pay. She says disability covers FMLA. Wants disability paper work to be off  till 11/19/11. She says me getting form ready by Monday 7/8/.13 is fine. I will work on it

## 2011-11-12 NOTE — Telephone Encounter (Signed)
I spoke with pt and she states she spoke with patricia downstairs and was advised they had sent pt's short term disability up to MR for him to review. Pt is needing this to be taken care of ASAP bc she could potentially lose her job. MR do you have anything on this pt. Please advise thanks

## 2011-11-12 NOTE — Telephone Encounter (Signed)
I saw that. Need to know how long she wants short term disability for ? And is this for cough she wants to be disabled ?

## 2011-11-12 NOTE — Telephone Encounter (Signed)
Spoke with pt. She states that she is needing to be out of work for her cough, and she does not know exactly how long she needs, but she can not return to work with the cough and diff breathing that she has. She has ov with TP on 11-19-11. Please advise when forms completed thanks

## 2011-11-17 NOTE — Telephone Encounter (Signed)
It is ready and with jennifer castillo right now

## 2011-11-17 NOTE — Telephone Encounter (Signed)
Form completed and sent to [employer]. Carron Curie, CMA

## 2011-11-19 ENCOUNTER — Encounter: Payer: Self-pay | Admitting: Adult Health

## 2011-11-19 ENCOUNTER — Ambulatory Visit (INDEPENDENT_AMBULATORY_CARE_PROVIDER_SITE_OTHER): Payer: Managed Care, Other (non HMO) | Admitting: Adult Health

## 2011-11-19 VITALS — BP 130/86 | HR 83 | Temp 97.0°F | Ht 66.0 in | Wt >= 6400 oz

## 2011-11-19 DIAGNOSIS — R05 Cough: Secondary | ICD-10-CM

## 2011-11-19 MED ORDER — BENZONATATE 200 MG PO CAPS: 200.0000 mg | ORAL_CAPSULE | Freq: Three times a day (TID) | ORAL | Status: AC | PRN

## 2011-11-19 MED ORDER — HYDROCODONE-HOMATROPINE 5-1.5 MG/5ML PO SYRP: 5.0000 mL | ORAL_SOLUTION | ORAL | Status: AC | PRN

## 2011-11-19 NOTE — Progress Notes (Signed)
Subjective:    Patient ID: Amber Salas, female    DOB: 03/24/1976, 36 y.o.   MRN: [id number] HPI PCP is O'SULLIVAN,MELISSA S., NP  Body mass index is 63.43 kg/(m^2).  Never smoker.   Known OSA - on cpap; managed by PMD.    IOV 10/29/2011  Baseline morbidly obese female who has gained 30# in few years since stopped exercising. Interested in weight loss  Baseline OSA managed on cpap by PMD  At baseline: Hx of ashma x 6 years. Dx in [place], [place] by a primary care physician on basis of symptoms nos and relief with albuterol nebulizer prn. There is no hx of having had PFTs. STarted on symbicort 3 years ago in [place] and reports relief. Typical asthma symptoms are dyspnea, gasping for air, cough, wheezing and resulting in vomiting. Symptoms are episodic including at rest, fumes, car smoke, perfumes. Symptoms are relieved by cppap. At baselin asthma symptom severity is moderate.  There is hx of 8 pred bursts x 12 months  At baseline, does have associated SINUS DRAINAGE and OCC. GERD.   At baseline also has chronic cough that is of moderate severity. 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  -  C/o dyspnea. having paroxysmal nocturnal dyspnea x 2 - 6 months several times a week. Gets up choking. Dyspnea is associated with cough x 2-6 months. Symbicort not helping that well this year. 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 Cough differentiator suggests neurogenic/airway cough worse (score 5 of 5), than GERD (score 3 of 5).       Dr Gretta Cool Reflux Symptom Index (> 13-15 suggestive of LPR cough) 0 -> 5  =  none ->severe problem  Hoarseness of problem with voice 5  Clearing  Of Throat 5  Excess throat mucus or feeling of post nasal drip 5  Difficulty swallowing food, liquid or tablets 1  Cough after eating or lying down 2  Breathing difficulties or choking episodes 5  Troublesome or annoying cough  3  Sensation of something sticking in throat or lump in throat 4  Heartburn, chest pain, indigestion, or stomach acid coming up 3  TOTAL 33     Kouffman Reflux v Neurogenic Cough Differentiator Reflux Neurogenic Comments  Do you awaken from a sound sleep coughing violently?                            With trouble breathing? Yes    Do you have choking episodes when you cannot  Get enough air, gasping for air ?              Yes    Do you usually cough when you lie down into  The bed, or when you just lie down to rest ?                          Yes    Do you usually cough after meals or eating?             Do you cough when (or after) you bend over?        Do you more-or-less cough all day long?  Yes   Does change of temperature make you cough?  Yes   Does laughing or chuckling cause you to cough?  YEs  Do fumes (perfume, automobile fumes, burned  Toast, etc.,) cause you to cough ?       Yes   Does speaking, singing, or talking on the phone cause you to cough   ?                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 Seen last ov for pulmonary consult for asthma and chronic cough.  Underwent PFTs on 6/28>FEV1 2.70 L/87% -mid flows with 26% change after SABA , ratio 77  Decreased DLCO at 58%.  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  Restarted  on symbicort.   She returns today with minimal improvement in cough. Still has coughing fits to point of vomitting.  Feels short of breat on/off.  Went to ER last week with neg cardiac enzymes, and D. Dimer.  CXR clear.  We discussed her PFT results.  She is still out of work. 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  Hx of OSA , referred to Dr. Craige Cotta later this month for sleep consult.  No exertional chest pain, edema or fever.   ROS:  Constitutional:    No  weight loss, night sweats,  Fevers, chills, ++ fatigue, or  lassitude.  HEENT:   No headaches,  Difficulty swallowing,  Tooth/dental problems, or  Sore throat,                No sneezing, itching, ear ache, ++ nasal congestion, post nasal drip,   CV:  No chest pain,  Orthopnea, PND, swelling in lower extremities, anasarca, dizziness, palpitations, syncope.   GI  ++ heartburn, indigestion, abdominal pain, nausea, vomiting, No  diarrhea, change in bowel habits, loss of appetite, bloody stools.   Resp:   No coughing up of blood.   No chest wall deformity  Skin: no rash or lesions.  GU: no dysuria, change in color of urine, no urgency or frequency.  No flank pain, no hematuria   MS:  No joint pain or swelling.  No decreased range of motion.  No back pain.  Psych:  No change in mood or affect. No depression or anxiety.  No memory loss.         Objective:  GEN: A/Ox3; pleasant , NAD, morbidly obese   HEENT:  Watson/AT,  EACs-clear, TMs-wnl, NOSE-pale , clear drainage  THROAT-clear, no lesions, no postnasal drip or exudate noted.   NECK:  Supple w/ fair ROM; no JVD; normal carotid impulses w/o bruits;  ?thyromegaly/fullness  no lymphadenopathy.  RESP  Clear  P & A; w/o, wheezes/ rales/ or rhonchi.no accessory muscle use, no dullness to percussion  CARD:  RRR, no m/r/g  , tr peripheral edema, pulses intact, no cyanosis or clubbing.  GI:   Soft & nt; nml bowel sounds; no organomegaly or masses detected.  Musco: Warm bil, no deformities or joint swelling noted.   Neuro: alert, no focal deficits noted.    Skin: Warm, no lesions or rashes

## 2011-11-19 NOTE — Assessment & Plan Note (Addendum)
Cyclical cough ? With RAD  PFT not overly convincing for asthma, however will continue symbicort for now May need methacholine challenge test in future.  Will treat aggressively for GERD and AR component. Exam ?thyroid fullness (although exam difficult with short thick neck -will need further evaluation with PCP -TSH nml earlier this year.   Plan :  Begin Delsym 2 tsp Twice daily   Begin Tessalon 200mg  1 Three times a day   May use Hydromet 1 tsp every 4 hr .As needed  For breakthrough cough.  Add Pepcid 20mg  At bedtime   Continue on Protonix 40mg  daily before meal  Begin Chlortrimeton 4mg  2 At bedtime   Continue on Zyrtec 10mg  daily in am .  Goal is not to cough or clear throat- use sugarless candy, water. -NO MINTS  GERD diet.  Continue on Symbicort 160/4.55mcg 2 puffs Twice daily  -brush/rinse/gargle after use.  Follow up with your Sandford Craze this week as planned - discuss with her your thyroid fullness.  follow up Dr. Marchelle Gearing in 4 weeks and As needed

## 2011-11-19 NOTE — Patient Instructions (Addendum)
Begin Delsym 2 tsp Twice daily   Begin Tessalon 200mg  1 Three times a day   May use Hydromet 1 tsp every 4 hr .As needed  For breakthrough cough.  Add Pepcid 20mg  At bedtime   Continue on Protonix 40mg  daily before meal  Begin Chlortrimeton 4mg  2 At bedtime   Continue on Zyrtec 10mg  daily in am .  Goal is not to cough or clear throat- use sugarless candy, water. -NO MINTS  GERD diet.  Continue on Symbicort 160/4.51mcg 2 puffs Twice daily  -brush/rinse/gargle after use.  Follow up with your Sandford Craze this week as planned - discuss with her your thyroid fullness.  follow up Dr. Marchelle Gearing in 4 weeks and As needed

## 2011-11-21 ENCOUNTER — Telehealth: Payer: Self-pay | Admitting: Adult Health

## 2011-11-21 ENCOUNTER — Ambulatory Visit: Payer: Managed Care, Other (non HMO) | Admitting: Family

## 2011-11-21 NOTE — Telephone Encounter (Signed)
lmomtcb for the pt.  

## 2011-11-21 NOTE — Telephone Encounter (Signed)
FMLA form still with [employer] - had spoke with Fleet Contras in that dept on date of pt's appt (11-19-11) and supposed to send a copy of that date's ov note and work note stamped with TP's signature downstairs to [employer].  The work note has a return to work date on it.  Didn't know that note had been faxed already.  Will still send ov note and work note to [employer] in inner-office envelope.  Will call [employer] after the lunch hour.

## 2011-11-21 NOTE — Telephone Encounter (Signed)
Never heard from Amber Salas.  Called and left her a voicemail on her work phone.  Called spoke with patient, informed her that she may return to work on 12-01-11 and that the letter has been sent to [employer].  Informed pt that I will call her back once I can verify this has been taken care of.

## 2011-11-21 NOTE — Telephone Encounter (Signed)
I spoke with pt and she states TP fileld out disability paperwork for her yesterday. She states the insurance received everything but their was no return to work date on it and they will need this. Also she is wanting to let TP know she can't fill her hydromet until next week when she gets paid. Shanda Bumps do you still have this form. Please advise thanks

## 2011-11-24 ENCOUNTER — Ambulatory Visit (INDEPENDENT_AMBULATORY_CARE_PROVIDER_SITE_OTHER): Payer: Managed Care, Other (non HMO) | Admitting: Family

## 2011-11-24 ENCOUNTER — Ambulatory Visit (HOSPITAL_BASED_OUTPATIENT_CLINIC_OR_DEPARTMENT_OTHER)
Admission: RE | Admit: 2011-11-24 | Discharge: 2011-11-24 | Disposition: A | Discharge status: Home / Self Care | Payer: Managed Care, Other (non HMO) | Source: Ambulatory Visit | Attending: Family | Admitting: Family

## 2011-11-24 ENCOUNTER — Encounter: Payer: Self-pay | Admitting: Family

## 2011-11-24 VITALS — BP 114/76 | HR 88 | Temp 98.3°F | Resp 18 | Ht 65.98 in | Wt >= 6400 oz

## 2011-11-24 DIAGNOSIS — R1310 Dysphagia, unspecified: Secondary | ICD-10-CM | POA: Insufficient documentation

## 2011-11-24 DIAGNOSIS — E010 Iodine-deficiency related diffuse (endemic) goiter: Secondary | ICD-10-CM

## 2011-11-24 DIAGNOSIS — J45909 Unspecified asthma, uncomplicated: Secondary | ICD-10-CM

## 2011-11-24 DIAGNOSIS — E049 Nontoxic goiter, unspecified: Secondary | ICD-10-CM

## 2011-11-24 DIAGNOSIS — E041 Nontoxic single thyroid nodule: Secondary | ICD-10-CM

## 2011-11-24 NOTE — Telephone Encounter (Signed)
Called spoke with Fleet Contras who reported she did not receive pt's records that were went on 11-21-11.  Resent via inner-office mail.

## 2011-11-24 NOTE — Progress Notes (Signed)
Subjective:    Patient ID: Amber Salas, female    DOB: [birth year], 36 y.o.   MRN: [id number]  HPI  Amber Salas is a 36 yr old female who presents today for follow up of her asthma.  She has been following with pulmonology who had her follow a strict cough protocol.  She reports that her cough is improving.  She is scheduled to go back to work on the 22nd.   Thyroid enlargement- pt reports that pulmonology told her to have her thyroid checked with [place] as it seemed enlarged.     Review of Systems    see HPI  Past Medical History  Diagnosis Date  . Asthma     history of  . Murmur, cardiac   . Retinal hole or tear     left eye- 1996 due to injury  . Obstructive sleep apnea on CPAP   . Morbid obesity   . Arrhythmia     History   Social History  . Marital Status: Married    Spouse Name: N/A    Number of Children: 3  . Years of Education: N/A   Occupational History  . CUSTOMER SERVICE [employer]   Social History Main Topics  . Smoking status: Never Smoker   . Smokeless tobacco: Never Used  . Alcohol Use: No  . Drug Use: No  . Sexually Active: Yes    Birth Control/ Protection: Surgical   Other Topics Concern  . 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    Past Surgical History  Procedure Date  . Cesarean section 4/04  . Hernia repair 04/2009    abdominal hernia  . Wisdom tooth extraction 1999  . Eye surgery 11/2010    lasar repair of retinal tear  . Tubal ligation     Family History  Problem Relation Age of Onset  . Hypertension Mother   . Diabetes Mother     type II  . Other Mother     cervical dysplasia  . Hypertension Father   . Bipolar disorder Father   . Heart disease Maternal Grandmother   . Hypertension Maternal Grandmother   . Bipolar disorder Maternal Grandmother   . Hypertension Maternal Grandfather   . Hypertension  Paternal Grandmother   . Hypertension Paternal Grandfather   . Asthma Son     x2    Allergies  Allergen Reactions  . Fluticasone-Salmeterol Palpitations and Other (See Comments)    chest pain  . Latex Rash and Other (See Comments)    wheezing  . Spinach Rash    Current Outpatient Prescriptions on File Prior to Visit  Medication Sig Dispense Refill  . albuterol (PROVENTIL HFA;VENTOLIN HFA) 108 (90 BASE) MCG/ACT inhaler Inhale 2 puffs into the lungs every 4 (four) hours as needed. For shortness of breath or wheezing      . albuterol (PROVENTIL) (2.5 MG/3ML) 0.083% nebulizer solution Take 3 mLs (2.5 mg total) by nebulization every 4 (four) hours as needed. For shortness of breath or wheezing  75 mL  5  . budesonide-formoterol (SYMBICORT) 160-4.5 MCG/ACT inhaler Inhale 2 puffs into the lungs 2 (two) times daily.  1 Inhaler  5  . cetirizine (ZYRTEC) 10 MG tablet Take 10 mg by mouth daily.      . fluticasone (FLONASE) 50 MCG/ACT nasal spray Place 2 sprays into the nose daily.  16 g  2  . ibuprofen (ADVIL,MOTRIN) 200 MG tablet Take 400 mg by mouth once as needed. For pain      . montelukast (SINGULAIR) 10 MG tablet Take 1 tablet by mouth daily.      . Multiple Vitamin (MULITIVITAMIN WITH MINERALS) TABS Take 1 tablet by mouth daily.      . Olopatadine HCl (PATADAY) 0.2 % SOLN Place 1 drop into both eyes as needed. For allergies      . pantoprazole (PROTONIX) 40 MG tablet Take 1 tablet (40 mg total) by mouth daily. On an empty stomach  30 tablet  6    BP 114/76  Pulse 88  Temp 98.3 F (36.8 C) (Oral)  Resp 18  Ht 5' 5.98" (1.676 m)  Wt 405 lb 12.8 oz (184.07 kg)  BMI 65.54 kg/m2  SpO2 99%  LMP 11/01/2011    Objective:   Physical Exam  Constitutional: She appears well-developed and well-nourished. No distress.  HENT:       ? Thyroid enlargement.  Exam limited by habitus/thick neck adipose tissue.  Cardiovascular: Normal rate and regular rhythm.   No murmur  heard. Pulmonary/Chest: Effort normal and breath sounds normal. No respiratory distress. She has no wheezes. She has no rales. She exhibits no tenderness.  Musculoskeletal: She exhibits no edema.  Skin: Skin is warm and dry. No rash noted. No erythema. No pallor.  Psychiatric: She has a normal mood and affect. Her behavior is normal. Judgment and thought content normal.          Assessment & Plan:

## 2011-11-24 NOTE — Patient Instructions (Addendum)
Please schedule your ultrasound on the first floor. Follow up in 3 months, sooner if problems/concerns.

## 2011-11-25 ENCOUNTER — Telehealth: Payer: Self-pay | Admitting: Family

## 2011-11-25 DIAGNOSIS — R1310 Dysphagia, unspecified: Secondary | ICD-10-CM

## 2011-11-25 NOTE — Telephone Encounter (Signed)
Spoke with pt re: dysphagia.  Report that it feels like the food gets stuck down low.  Will refer for GI eval.  Pt agreeable.

## 2011-11-25 NOTE — Telephone Encounter (Signed)
Amber Salas called this morning.  She received the ov and work note sent yesterday and stated that she will take care of faxing it.  Called, left detailed message on pt's named voice mail informing her of this.  Encouraged pt to call back if she has any questions / concerns.  Will sign off.

## 2011-11-27 ENCOUNTER — Encounter: Payer: Self-pay | Admitting: Gastroenterology

## 2011-12-01 ENCOUNTER — Telehealth: Payer: Self-pay | Admitting: Internal Medicine

## 2011-12-01 DIAGNOSIS — E041 Nontoxic single thyroid nodule: Secondary | ICD-10-CM | POA: Insufficient documentation

## 2011-12-01 NOTE — Telephone Encounter (Signed)
Please schedule for ROV 7/23 to re-assess status.

## 2011-12-01 NOTE — Telephone Encounter (Signed)
Pt returned our call Emily E McAlister  °

## 2011-12-01 NOTE — Assessment & Plan Note (Signed)
Clinically improving.  Management per pulmonology.

## 2011-12-01 NOTE — Telephone Encounter (Signed)
lmomtcb  

## 2011-12-01 NOTE — Assessment & Plan Note (Signed)
TSH normal.  US shows-  Hypoechoic bilateral possible cystic nodules are noted bilaterally measuring 5 mm and left, unlikely to be significant. None meet criteria for biopsy.  Pt reports some sense of dysphagia.  Refer to GI (see phone note 7/16).

## 2011-12-01 NOTE — Telephone Encounter (Signed)
Patient states her cough is not improving. She stuill has hoarseness and her throat is extremely dry all the time. She says she is following all recs by TP since last ov on 11/19/11. Pt is due to go back to work tomorrow, 12/02/11 but doesn't geel she will be able to perform her job because she cannot talk a lot without coughing. Both MR and TP are out of the office this week and we will have to get recs from our doc of the day, Dr. Craige Cotta. Pls advise.  Patient will need another letter if she needs to stay our or work.

## 2011-12-01 NOTE — Telephone Encounter (Signed)
Patient is aware of VS recs and has been scheduled with MW at 2:30pm to have her symptoms assessed.

## 2011-12-01 NOTE — Telephone Encounter (Signed)
lmomtcb x1 

## 2011-12-02 ENCOUNTER — Ambulatory Visit (INDEPENDENT_AMBULATORY_CARE_PROVIDER_SITE_OTHER): Payer: Managed Care, Other (non HMO) | Admitting: Internal Medicine

## 2011-12-02 ENCOUNTER — Encounter: Payer: Self-pay | Admitting: Internal Medicine

## 2011-12-02 VITALS — BP 124/76 | HR 92 | Temp 98.2°F | Ht 66.0 in | Wt >= 6400 oz

## 2011-12-02 DIAGNOSIS — R05 Cough: Secondary | ICD-10-CM

## 2011-12-02 DIAGNOSIS — J45909 Unspecified asthma, uncomplicated: Secondary | ICD-10-CM

## 2011-12-02 MED ORDER — TRAMADOL HCL 50 MG PO TABS: ORAL_TABLET | ORAL | Status: AC

## 2011-12-02 MED ORDER — PANTOPRAZOLE SODIUM 40 MG PO TBEC: DELAYED_RELEASE_TABLET | ORAL | Status: DC

## 2011-12-02 MED ORDER — BECLOMETHASONE DIPROPIONATE 40 MCG/ACT IN AERS: 2.0000 | INHALATION_SPRAY | Freq: Two times a day (BID) | RESPIRATORY_TRACT | Status: DC

## 2011-12-02 MED ORDER — PREDNISONE (PAK) 10 MG PO TABS: ORAL_TABLET | ORAL | Status: AC

## 2011-12-02 NOTE — Patient Instructions (Addendum)
Take delsym two tsp every 12 hours and supplement if needed with  tramadol 50 mg up to 2 every 4 hours to suppress the urge to cough. Swallowing water or using ice chips/non mint and menthol containing candies (such as lifesavers or sugarless jolly ranchers) are also effective.  You should rest your voice and avoid activities that you know make you cough.  Once you have eliminated the cough for 3 straight days try reducing the tramadol first,  then the delsym as tolerated.    Only use your albuterol as a rescue medication(proventil is Plan B,  Nebulizer is Plan C)  to be used if you can't catch your breath by resting or doing a relaxed purse lip breathing pattern. The less you use it, the better it will work when you need it.   Stop symbicort for now  Protonix 40 mg Take 30- 60 min before your first and last meals of the day   Prednisone 10 mg take  4 each am x 2 days,   2 each am x 2 days,  1 each am x2days and stop   Qvar 40 Take 2 puffs first thing in am and then another 2 puffs about 12 hours later.

## 2011-12-02 NOTE — Progress Notes (Signed)
Subjective:    Patient ID: Amber Salas, female    DOB: 1975/09/06, 36 y.o.   MRN: [id number] PCP is O'SULLIVAN,MELISSA S., NP  Body mass index is 63.43 kg/(m^2).  Never smoker.  Known OSA - on cpap; managed by PMD.    IOV 10/29/2011/ Ramaswamy  Baseline morbidly obese female who has gained 30# in few years since stopped exercising. Interested in weight loss  Baseline OSA managed on cpap by PMD  At baseline: Hx of ashma x 6 years. Dx in [place], [place] by a primary care physician on basis of symptoms nos and relief with albuterol nebulizer prn. There is no hx of having had PFTs. STarted on symbicort 3 years ago in [place] and reports relief. Typical asthma symptoms are dyspnea, gasping for air, cough, wheezing and resulting in vomiting. Symptoms are episodic including at rest, fumes, car smoke, perfumes. Symptoms are relieved by cppap. At baselin asthma symptom severity is moderate.  There is hx of 8 pred bursts x 12 months  At baseline, does have associated SINUS DRAINAGE and OCC. GERD.   At baseline also has chronic cough that is of moderate severity. 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  -  C/o dyspnea. having paroxysmal nocturnal dyspnea x 2 - 6 months several times a week. Gets up choking. Dyspnea is associated with cough x 2-6 months. Symbicort not helping that well this year. 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 Cough differentiator suggests neurogenic/airway cough worse (score 5 of 5), than GERD (score 3 of 5).       Dr Gretta Cool Reflux Symptom Index (> 13-15 suggestive of LPR cough) 0 -> 5  =  none ->severe problem  Hoarseness of problem with voice 5  Clearing  Of Throat 5  Excess throat mucus or feeling of post nasal drip 5  Difficulty swallowing food, liquid or tablets 1  Cough after eating or lying down 2  Breathing difficulties or choking episodes 5  Troublesome or annoying  cough 3  Sensation of something sticking in throat or lump in throat 4  Heartburn, chest pain, indigestion, or stomach acid coming up 3  TOTAL 33     Kouffman Reflux v Neurogenic Cough Differentiator Reflux Neurogenic Comments  Do you awaken from a sound sleep coughing violently?                            With trouble breathing? Yes    Do you have choking episodes when you cannot  Get enough air, gasping for air ?              Yes    Do you usually cough when you lie down into  The bed, or when you just lie down to rest ?                          Yes    Do you usually cough after meals or eating?             Do you cough when (or after) you bend over?        Do you more-or-less cough all day long?  Yes   Does change of temperature make you cough?  Yes   Does laughing or chuckling cause you to cough?  YEs  Do fumes (perfume, automobile fumes, burned  Toast, etc.,) cause you to cough ?       Yes   Does speaking, singing, or talking on the phone cause you to cough   ?                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 Seen last ov for pulmonary consult for asthma and chronic cough.  Underwent PFTs on 6/28>FEV1 2.70 L/87% -mid flows with 26% change after SABA , ratio 77  Decreased DLCO at 58%.  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  Restarted  on symbicort.   She returns > minimal improvement in cough. Still has coughing fits to point of vomiting.  Feels short of breat on/off.  Went to ER last week with neg cardiac enzymes, and D. Dimer.  CXR clear.  We discussed her PFT results.  She is still out of work. 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  Hx of OSA , referred to Dr. Craige Cotta later this month for sleep consult.  No exertional chest pain, edema or fever.  rec Begin Delsym 2 tsp  Twice daily   Begin Tessalon 200mg  1 Three times a day   May use Hydromet 1 tsp every 4 hr .As needed  For breakthrough cough.  Add Pepcid 20mg  At bedtime   Continue on Protonix 40mg  daily before meal  Begin Chlortrimeton 4mg  2 At bedtime   Continue on Zyrtec 10mg  daily in am .  Goal is not to cough or clear throat- use sugarless candy, water. -NO MINTS  GERD diet.  Continue on Symbicort 160/4.32mcg 2 puffs Twice daily  -brush/rinse/gargle after use    12/02/2011 f/u ov/[name] not able to sustain s prednisone since June 2012 and finished last course x 2 weeks prior to OV  Cc 7/18 increase cough to point of vomiting. But even before flare still needed nebulizer around lunchtime plus saba q3-4 h including in the car on the way over for ov. Ran out singulair about a week before onset ? Worse, not sure. Taking protonix ac and pepcid 20 mg at hs.  Cough tends to be worse p supper before lie down and much worse at hs x 3-4 days prior to OV .  No significant sputum production , no overt sinus or reflux complaints Sleeping eventually improves and denies any early am exacerbation  of respiratory  c/o's or need for noct saba. Also denies any obvious fluctuation of symptoms with weather or environmental changes or other aggravating or alleviating factors except as outlined above   ROS  The following are not active complaints unless bolded sore throat, dysphagia, dental problems, itching, sneezing,  nasal congestion or excess/ purulent secretions, ear ache,   fever, chills, sweats, unintended wt loss, pleuritic or exertional cp, hemoptysis,  orthopnea pnd or leg swelling, presyncope, palpitations, heartburn, abdominal pain, anorexia, nausea, vomiting, diarrhea  or change in bowel or urinary habits, change in stools or urine, dysuria,hematuria,  rash, arthralgias, visual complaints, headache, numbness weakness or ataxia or problems with walking or coordination,  change in mood/affect or memory.                 Objective:  GEN: A/Ox3;  pleasant , NAD, morbidly obese   HEENT:  [place]/AT,  EACs-clear, TMs-wnl, NOSE-pale , clear drainage  THROAT-clear, no lesions, no postnasal drip or exudate noted.   NECK:  Supple w/ fair ROM; no JVD; normal carotid impulses w/o bruits;  ?thyromegaly/fullness  no lymphadenopathy.  RESP  Clear  P & A; w/o, wheezes/ rales/ or rhonchi.no accessory muscle use, no dullness to percussion  CARD:  RRR, no m/r/g  , tr peripheral edema, pulses intact, no cyanosis or clubbing.  GI:   Soft & nt; nml bowel sounds; no organomegaly or masses detected.  Musco: Warm bil, no deformities or joint swelling noted.   Neuro: alert, no focal deficits noted.    Skin: Warm, no lesions or rashes

## 2011-12-03 NOTE — Assessment & Plan Note (Signed)
Symptoms are markedly disproportionate to objective findings and not clear this is a lung problem but pt does appear to have difficult airway management issues.  DDX of  difficult airways managment all start with A and  include Adherence, Ace Inhibitors, Acid Reflux, Active Sinus Disease, Alpha 1 Antitripsin deficiency, Anxiety masquerading as Airways dz,  ABPA,  allergy(esp in young), Aspiration (esp in elderly), Adverse effects of DPI,  Active smokers, plus two Bs  = Bronchiectasis and Beta blocker use..and one C= CHF   Adherence is always the initial "prime suspect" and is a multilayered concern that requires a "trust but verify" approach in every patient - starting with knowing how to use medications, especially inhalers, correctly, keeping up with refills and understanding the fundamental difference between maintenance and prns vs those medications only taken for a very short course and then stopped and not refilled. The proper method of use, as well as anticipated side effects, of a metered-dose inhaler are discussed and demonstrated to the patient. Improved effectiveness after extensive coaching during this visit to a level of approximately  75%.  Try qvar 40 2bid  ? Acid reflux > continue rx plus diet

## 2011-12-03 NOTE — Assessment & Plan Note (Signed)
This is most c/w  Classic Upper airway cough syndrome, so named because it's frequently impossible to sort out how much is  CR/sinusitis with freq throat clearing (which can be related to primary GERD)   vs  causing  secondary (" extra esophageal")  GERD from wide swings in gastric pressure that occur with throat clearing, often  promoting self use of mint and menthol lozenges that reduce the lower esophageal sphincter tone and exacerbate the problem further in a cyclical fashion.   These are the same pts (now being labeled as having "irritable larynx syndrome" by some cough centers) who not infrequently have a history of having failed to tolerate ace inhibitors,  dry powder inhalers or even hfa ics or biphosphonates or report having atypical reflux symptoms that don't respond to standard doses of PPI , and are easily confused as having aecopd or asthma flares by even experienced allergists/ pulmonologists.  For now try on qvar 40 2 bid in case there is a component of cough variant asthma and in meantime max rx for gerd/ cyclical cough

## 2011-12-04 ENCOUNTER — Ambulatory Visit (INDEPENDENT_AMBULATORY_CARE_PROVIDER_SITE_OTHER): Payer: Managed Care, Other (non HMO) | Admitting: Pulmonary Disease

## 2011-12-04 ENCOUNTER — Encounter: Payer: Self-pay | Admitting: Pulmonary Disease

## 2011-12-04 VITALS — BP 130/82 | HR 93 | Temp 98.2°F | Ht 66.0 in | Wt 398.2 lb

## 2011-12-04 DIAGNOSIS — G4733 Obstructive sleep apnea (adult) (pediatric): Secondary | ICD-10-CM

## 2011-12-04 NOTE — Progress Notes (Signed)
Chief Complaint  Patient presents with  . Sleep Conult    refer MR. Pt here to make sure her cpap machine is on correct pressure. She wears her cpap machine everynight x 7 hrs a night. feels rested since being on cpap. has been on cpap x 6 months    History of Present Illness: Amber Salas is a 36 y.o. female for evaluation of sleep apnea.  She had sleep study 09/26/10>>AHI 31, SpO2 low 88%, CPAP 9 cm H2O.  She has been using CPAP since October 2012.  She feels this helps.  She uses a full face mask.  She uses [employer] for her DME.  She still snores even with CPAP.  She will need to take naps still, and uses CPAP when she naps.  She goes to bed at 11 pm.  She falls asleep quickly.  She wakes up at 630 am.  She denies morning headache.  She is not using anything to help her sleep or stay awake.  She does talk in her sleep.  The patient denies sleep walking, bruxism, or nightmares.  There is no history of restless legs.  The patient denies sleep hallucinations, sleep paralysis, or cataplexy.  Epworth score is 14 out of 24.   Past Medical History  Diagnosis Date  . Asthma     history of  . Murmur, cardiac   . Retinal hole or tear     left eye- 1996 due to injury  . Obstructive sleep apnea on CPAP   . Morbid obesity   . Arrhythmia   . OSA (obstructive sleep apnea) 08/30/2010    Past Surgical History  Procedure Date  . Cesarean section 4/04  . Hernia repair 04/2009    abdominal hernia  . Wisdom tooth extraction 1999  . Eye surgery 11/2010    lasar repair of retinal tear  . Tubal ligation     Current Outpatient Prescriptions on File Prior to Visit  Medication Sig Dispense Refill  . albuterol (PROVENTIL HFA;VENTOLIN HFA) 108 (90 BASE) MCG/ACT inhaler Inhale 2 puffs into the lungs every 4 (four) hours as needed. For shortness of breath or wheezing      . albuterol (PROVENTIL) (2.5 MG/3ML) 0.083% nebulizer solution Take 3 mLs (2.5 mg total) by nebulization every 4 (four) hours  as needed. For shortness of breath or wheezing  75 mL  5  . beclomethasone (QVAR) 40 MCG/ACT inhaler Inhale 2 puffs into the lungs 2 (two) times daily. Take 2 puffs first thing in am and then another 2 puffs about 12 hours later.  1 Inhaler  11  . cetirizine (ZYRTEC) 10 MG tablet Take 10 mg by mouth daily.      . famotidine (PEPCID) 20 MG tablet Take 20 mg by mouth at bedtime.      . fluticasone (FLONASE) 50 MCG/ACT nasal spray Place 2 sprays into the nose daily.  16 g  2  . ibuprofen (ADVIL,MOTRIN) 200 MG tablet Take 400 mg by mouth once as needed. For pain      . montelukast (SINGULAIR) 10 MG tablet Take 1 tablet by mouth daily.      . Multiple Vitamin (MULITIVITAMIN WITH MINERALS) TABS Take 1 tablet by mouth daily.      . Olopatadine HCl (PATADAY) 0.2 % SOLN Place 1 drop into both eyes as needed. For allergies      . pantoprazole (PROTONIX) 40 MG tablet Take 30- 60 min before your first and last meals of the day  60 tablet  6  . predniSONE (STERAPRED UNI-PAK) 10 MG tablet Prednisone 10 mg take  4 each am x 2 days,   2 each am x 2 days,  1 each am x2days and stop  14 tablet  0  . traMADol (ULTRAM) 50 MG tablet 1-2 every 4 hours as needed for cough or pain  40 tablet  0    Allergies  Allergen Reactions  . Fluticasone-Salmeterol Palpitations and Other (See Comments)    chest pain  . Latex Rash and Other (See Comments)    wheezing  . Spinach Rash    Family History  Problem Relation Age of Onset  . Hypertension Mother   . Diabetes Mother     type II  . Other Mother     cervical dysplasia  . Hypertension Father   . Bipolar disorder Father   . Heart disease Maternal Grandmother   . Hypertension Maternal Grandmother   . Bipolar disorder Maternal Grandmother   . Hypertension Maternal Grandfather   . Hypertension Paternal Grandmother   . Hypertension Paternal Grandfather     History  Substance Use Topics  . Smoking status: Never Smoker   . Smokeless tobacco: Never Used  . Alcohol  Use: No     Physical Exam: Filed Vitals:   12/04/11 1350  BP: 130/82  Pulse: 93  Temp: 98.2 F (36.8 C)  TempSrc: Oral  Height: 5\' 6"  (1.676 m)  Weight: 398 lb 3.2 oz (180.622 kg)  SpO2: 98%  ,  Current Encounter SPO2  12/04/11 1350 98%  12/02/11 1440 98%  11/24/11 1600 99%    Wt Readings from Last 3 Encounters:  12/04/11 398 lb 3.2 oz (180.622 kg)  12/02/11 401 lb 6.4 oz (182.074 kg)  11/24/11 405 lb 12.8 oz (184.07 kg)    Body mass index is 64.27 kg/(m^2).   General - No distress, obese ENT - TM clear, no sinus tenderness, no oral exudate, no LAN, no thyromegaly Cardiac - s1s2 regular, no murmur, pulses symmetric, no edema Chest - normal respiratory excursion, good air entry, no wheeze/rales/dullness Back - no focal tenderness Abd - soft, non-tender, no organomegaly, + bowel sounds Ext - normal motor strength Neuro - Cranial nerves are normal. PERLA. EOM's intact. Skin - no discernible active dermatitis, erythema, urticaria or inflammatory process. Psych - normal mood, and behavior.   US Soft Tissue Head/neck  11/24/2011  *RADIOLOGY REPORT*  Clinical Data: Difficulty swallowing, possible thyromegaly  THYROID ULTRASOUND  Technique: Ultrasound examination of the thyroid gland and adjacent soft tissues was performed.  Comparison:  None.  Findings:  Right thyroid lobe:  5.8 x 2.4 x 2.4 cm. Left thyroid lobe:  5.6 x 2.3 x 2.0 cm Isthmus:  0.8 cm  Focal nodules:  Hypoechoic bilateral possible cystic nodules are noted bilaterally measuring 5 mm and left, unlikely to be significant.  Lymphadenopathy:  None visualized.  IMPRESSION: No dominant solid nodule or thyromegaly.  Original Report Authenticated By: Harrel Lemon, M.D.    Lab Results  Component Value Date   WBC 11.5* 11/11/2011   HGB 12.0 11/11/2011   HCT 36.6 11/11/2011   MCV 86.3 11/11/2011   PLT 352 11/11/2011    Lab Results  Component Value Date   CREATININE 0.83 11/11/2011   BUN 14 11/11/2011   NA 134* 11/11/2011     K 4.2 11/11/2011   CL 103 11/11/2011   CO2 25 11/11/2011    Lab Results  Component Value Date   ALT 11  11/11/2011   AST 10 11/11/2011   ALKPHOS 65 11/11/2011   BILITOT 0.3 11/11/2011    Lab Results  Component Value Date   TSH 1.597 11/24/2011    BNP    Component Value Date/Time   PROBNP 17.2 08/27/2011 1445      Assessment/Plan:  Coralyn Helling, MD [facility] Pager:  [phone] 12/04/2011, 2:05 PM

## 2011-12-04 NOTE — Progress Notes (Signed)
  Subjective:    Patient ID: Amber Salas, female    DOB: April 14, 1976, 36 y.o.   MRN: [id number]  HPI    Review of Systems  Constitutional: Positive for unexpected weight change. Negative for fever and appetite change.  HENT: Positive for sore throat, sneezing and trouble swallowing. Negative for ear pain and congestion.   Respiratory: Positive for cough and shortness of breath.   Cardiovascular: Negative for chest pain, palpitations and leg swelling.  Gastrointestinal: Positive for abdominal pain.  Musculoskeletal: Positive for joint swelling and arthralgias.  Skin: Negative for rash.  Neurological: Positive for headaches.  Psychiatric/Behavioral: Negative for dysphoric mood. The patient is not nervous/anxious.        Objective:   Physical Exam        Assessment & Plan:

## 2011-12-04 NOTE — Assessment & Plan Note (Signed)
She has severe sleep apnea.  She has partial improvement after starting CPAP.  She still has problems with snoring, and daytime sleepiness.  She may need adjustment in her CPAP settings.  I have reviewed her sleep test results with the patient.  Explained how sleep apnea can affect the patient's health.  Driving precautions and importance of weight loss were discussed.  Treatment options for sleep apnea were reviewed.  Will get CPAP download and then determine if she needs to have adjustment in her pressure settings.  Advised her to contact her DME about when she is due for new supplies.

## 2011-12-04 NOTE — Patient Instructions (Signed)
Will get copy of CPAP report and call with results Follow up in one year 

## 2011-12-09 ENCOUNTER — Telehealth: Payer: Self-pay | Admitting: Internal Medicine

## 2011-12-09 NOTE — Telephone Encounter (Signed)
I spoke with Fleet Contras in [facility] and she states they have the authorization from the pt and are getting all the paperwork together and they will fax over today. Pt is aware.  Carron Curie, CMA

## 2011-12-10 ENCOUNTER — Encounter: Payer: Self-pay | Admitting: Adult Health

## 2011-12-10 ENCOUNTER — Ambulatory Visit (INDEPENDENT_AMBULATORY_CARE_PROVIDER_SITE_OTHER): Payer: Managed Care, Other (non HMO) | Admitting: Adult Health

## 2011-12-10 VITALS — BP 118/76 | HR 82 | Temp 98.4°F | Ht 66.0 in | Wt 395.0 lb

## 2011-12-10 DIAGNOSIS — J45909 Unspecified asthma, uncomplicated: Secondary | ICD-10-CM

## 2011-12-10 NOTE — Patient Instructions (Addendum)
Take delsym two tsp every 12 hours and supplement if needed with  tramadol 50 mg up to 2 every 4 hours to suppress the urge to cough. Swallowing water or using ice chips/non mint and menthol containing candies (such as lifesavers or sugarless jolly ranchers) are also effective.  You should rest your voice and avoid activities that you know make you cough.  Once you have eliminated the cough for 3 straight days try reducing the tramadol first,  then the delsym as tolerated.    Only use your albuterol as a rescue medication(proventil is Plan B,  Nebulizer is Plan C)  to be used if you can't catch your breath by resting or doing a relaxed purse lip breathing pattern. The less you use it, the better it will work when you need it.   Dexilant or Protonix 40 mg Take 30- 60 min before your first and last meals of the day   Prednisone 10 mg take  4 each am x 2 days,   2 each am x 2 days,  1 each am x2days and stop   Qvar 40 Take 2 puffs first thing in am and then another 2 puffs about 12 hours later.   Follow up in 2 weeks and As needed

## 2011-12-10 NOTE — Progress Notes (Signed)
Subjective:    Patient ID: Amber Salas, female    DOB: Dec 30, 1975, 36 y.o.   MRN: [id number] PCP is O'SULLIVAN,MELISSA S., NP  Body mass index is 63.43 kg/(m^2).  Never smoker.  Known OSA - on cpap; managed by PMD.    IOV 10/29/2011/ Ramaswamy  Baseline morbidly obese female who has gained 30# in few years since stopped exercising. Interested in weight loss  Baseline OSA managed on cpap by PMD  At baseline: Hx of ashma x 6 years. Dx in [place], [place] by a primary care physician on basis of symptoms nos and relief with albuterol nebulizer prn. There is no hx of having had PFTs. STarted on symbicort 3 years ago in [place] and reports relief. Typical asthma symptoms are dyspnea, gasping for air, cough, wheezing and resulting in vomiting. Symptoms are episodic including at rest, fumes, car smoke, perfumes. Symptoms are relieved by cppap. At baselin asthma symptom severity is moderate.  There is hx of 8 pred bursts x 12 months  At baseline, does have associated SINUS DRAINAGE and OCC. GERD.   At baseline also has chronic cough that is of moderate severity. 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  -  C/o dyspnea. having paroxysmal nocturnal dyspnea x 2 - 6 months several times a week. Gets up choking. Dyspnea is associated with cough x 2-6 months. Symbicort not helping that well this year. 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 Cough differentiator suggests neurogenic/airway cough worse (score 5 of 5), than GERD (score 3 of 5).       Dr Gretta Cool Reflux Symptom Index (> 13-15 suggestive of LPR cough) 0 -> 5  =  none ->severe problem  Hoarseness of problem with voice 5  Clearing  Of Throat 5  Excess throat mucus or feeling of post nasal drip 5  Difficulty swallowing food, liquid or tablets 1  Cough after eating or lying down 2  Breathing difficulties or choking episodes 5  Troublesome or annoying  cough 3  Sensation of something sticking in throat or lump in throat 4  Heartburn, chest pain, indigestion, or stomach acid coming up 3  TOTAL 33     Kouffman Reflux v Neurogenic Cough Differentiator Reflux Neurogenic Comments  Do you awaken from a sound sleep coughing violently?                            With trouble breathing? Yes    Do you have choking episodes when you cannot  Get enough air, gasping for air ?              Yes    Do you usually cough when you lie down into  The bed, or when you just lie down to rest ?                          Yes    Do you usually cough after meals or eating?             Do you cough when (or after) you bend over?        Do you more-or-less cough all day long?  Yes   Does change of temperature make you cough?  Yes   Does laughing or chuckling cause you to cough?  YEs  Do fumes (perfume, automobile fumes, burned  Toast, etc.,) cause you to cough ?       Yes   Does speaking, singing, or talking on the phone cause you to cough   ?                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 Seen last ov for pulmonary consult for asthma and chronic cough.  Underwent PFTs on 6/28>FEV1 2.70 L/87% -mid flows with 26% change after SABA , ratio 77  Decreased DLCO at 58%.  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  Restarted  on symbicort.   She returns > minimal improvement in cough. Still has coughing fits to point of vomiting.  Feels short of breat on/off.  Went to ER last week with neg cardiac enzymes, and D. Dimer.  CXR clear.  We discussed her PFT results.  She is still out of work. 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  Hx of OSA , referred to Dr. Craige Cotta later this month for sleep consult.  No exertional chest pain, edema or fever.  rec Begin Delsym 2 tsp  Twice daily   Begin Tessalon 200mg  1 Three times a day   May use Hydromet 1 tsp every 4 hr .As needed  For breakthrough cough.  Add Pepcid 20mg  At bedtime   Continue on Protonix 40mg  daily before meal  Begin Chlortrimeton 4mg  2 At bedtime   Continue on Zyrtec 10mg  daily in am .  Goal is not to cough or clear throat- use sugarless candy, water. -NO MINTS  GERD diet.  Continue on Symbicort 160/4.39mcg 2 puffs Twice daily  -brush/rinse/gargle after use    12/02/2011 f/u ov/Wert not able to sustain s prednisone since June 2012 and finished last course x 2 weeks prior to OV  Cc 7/18 increase cough to point of vomiting. But even before flare still needed nebulizer around lunchtime plus saba q3-4 h including in the car on the way over for ov. Ran out singulair about a week before onset ? Worse, not sure. Taking protonix ac and pepcid 20 mg at hs.  Cough tends to be worse p supper before lie down and much worse at hs x 3-4 days prior to OV . >>changed to QVAR , rx pred taper along with Protonix and tramadol   12/10/11 Follow up  Returns for follow up  1 week follow up - reports SOB is improved with qvar,  but dry cough is slightly worse b/c unable to get the pred, tramadol, protonix She did not have the money for the rx until now . She is planning on picking them up in 2 days  She is feeling better w/ decreased dyspnea. Cough is unchanged No fever or hemoptysis .  Had thyroid US w/ thyroid cysts noted, no dominant cysts or thyromegaly .            ROS:  Constitutional:   No  weight loss, night sweats,  Fevers, chills, + fatigue, or  lassitude.  HEENT:   No headaches,  Difficulty swallowing,  Tooth/dental problems, or  Sore throat,                No sneezing, itching,  ear ache, nasal congestion, post nasal drip,   CV:  No chest pain,  Orthopnea, PND, swelling in lower extremities, anasarca, dizziness, palpitations, syncope.   GI  No heartburn, indigestion, abdominal pain, nausea, vomiting,  diarrhea, change in bowel habits, loss of appetite, bloody stools.   Resp:  No coughing up of blood.    No chest wall deformity  Skin: no rash or lesions.  GU: no dysuria, change in color of urine, no urgency or frequency.  No flank pain, no hematuria   MS:  No joint pain or swelling.  No decreased range of motion.    Psych:  No change in mood or affect. No depression or anxiety.  No memory loss.             Objective:  GEN: A/Ox3; pleasant , NAD, morbidly obese   HEENT:  [place]/AT,  EACs-clear, TMs-wnl, NOSE-pale , clear drainage  THROAT-clear, no lesions, no postnasal drip or exudate noted.   NECK:  Supple w/ fair ROM; no JVD; normal carotid impulses w/o bruits; no thyromegaly or  lymphadenopathy.  RESP  Clear  P & A; w/o, wheezes/ rales/ or rhonchi.no accessory muscle use, no dullness to percussion  CARD:  RRR, no m/r/g  , tr peripheral edema, pulses intact, no cyanosis or clubbing.  GI:   Soft & nt; nml bowel sounds; no organomegaly or masses detected.  Musco: Warm bil, no deformities or joint swelling noted.   Neuro: alert, no focal deficits noted.    Skin: Warm, no lesions or rashes

## 2011-12-11 ENCOUNTER — Encounter: Payer: Self-pay | Admitting: Gastroenterology

## 2011-12-11 ENCOUNTER — Ambulatory Visit (INDEPENDENT_AMBULATORY_CARE_PROVIDER_SITE_OTHER): Payer: Managed Care, Other (non HMO) | Admitting: Gastroenterology

## 2011-12-11 ENCOUNTER — Ambulatory Visit: Payer: Managed Care, Other (non HMO) | Admitting: Gastroenterology

## 2011-12-11 VITALS — BP 110/80 | HR 60 | Ht 67.0 in | Wt 392.2 lb

## 2011-12-11 DIAGNOSIS — K219 Gastro-esophageal reflux disease without esophagitis: Secondary | ICD-10-CM

## 2011-12-11 DIAGNOSIS — R1319 Other dysphagia: Secondary | ICD-10-CM

## 2011-12-11 NOTE — Progress Notes (Signed)
History of Present Illness: This is a 36 year old female who has asthma and a chronic cough. She has been treated with multiple bursts of prednisone over the past year. She relates a history of frequent heartburn over the past 1-2 years. She notes regular difficulty swallowing solids and liquids. She feels that both solids and liquids stick in her throat. She has episodes of severe coughing that leads to nausea and vomiting. She was treated with pantoprazole daily for several weeks with improvement in her heartburn but no improvement of her other symptoms. Her pantoprazole was recently increased to twice a day and Pepcid was added at night and she has not noted improvement in her other symptoms. Denies weight loss, abdominal pain, constipation, diarrhea, change in stool caliber, melena, hematochezia, chest pain.  Review of Systems: Pertinent positive and negative review of systems were noted in the above HPI section. All other review of systems were otherwise negative.  Current Medications, Allergies, Past Medical History, Past Surgical History, Family History and Social History were reviewed in [employer] record.  Physical Exam: General: Well developed , well nourished, obese, no acute distress Head: Normocephalic and atraumatic Eyes:  sclerae anicteric, EOMI Ears: Normal auditory acuity Mouth: No deformity or lesions Neck: Supple, no masses or thyromegaly Lungs: Clear throughout to auscultation Heart: Regular rate and rhythm; no murmurs, rubs or bruits Abdomen: Soft, non tender and non distended. No masses, hepatosplenomegaly or hernias noted. Normal Bowel sounds Musculoskeletal: Symmetrical with no gross deformities  Skin: No lesions on visible extremities Pulses:  Normal pulses noted Extremities: No clubbing, cyanosis, edema or deformities noted Neurological: Alert oriented x 4, grossly nonfocal Cervical Nodes:  No significant cervical adenopathy Inguinal Nodes: No  significant inguinal adenopathy Psychological:  Alert and cooperative. Normal mood and affect  Assessment and Recommendations:  1. Chronic cough with asthma. Occasional episodes of coughing spells lead to vomiting. She appears to have GERD which may be contributing to her cough and asthma. Standard antireflux measures. Continue pantoprazole 40 mg twice a day and Pepcid 20 mg at bedtime.   2. The cause of her dysphasia is not clear. This may be globus sensation. Rule out oropharyngeal and esophageal disorders such as Candida esophagitis, reflux esophagitis or motility disorder. Schedule a modified barium swallow study and barium esophagram. Schedule upper endoscopy with possible dilation. The risks, benefits, and alternatives to endoscopy with possible biopsy and possible dilation were discussed with the patient and they consent to proceed. She is at higher risk for complications and intubation would be difficult in the rare event that it is needed due to her obesity so that procedure will be scheduled at the hospital. If there are findings on the barium studies that allow [place] to defer endoscopy we will do so.  3. Obstructive sleep apnea  4. Asthma

## 2011-12-11 NOTE — Assessment & Plan Note (Signed)
Slow to resolve flare with cyclical cough   Plan Take delsym two tsp every 12 hours and supplement if needed with  tramadol 50 mg up to 2 every 4 hours to suppress the urge to cough. Swallowing water or using ice chips/non mint and menthol containing candies (such as lifesavers or sugarless jolly ranchers) are also effective.  You should rest your voice and avoid activities that you know make you cough.  Once you have eliminated the cough for 3 straight days try reducing the tramadol first,  then the delsym as tolerated.    Only use your albuterol as a rescue medication(proventil is Plan B,  Nebulizer is Plan C)  to be used if you can't catch your breath by resting or doing a relaxed purse lip breathing pattern. The less you use it, the better it will work when you need it.   Dexilant or Protonix 40 mg Take 30- 60 min before your first and last meals of the day   Prednisone 10 mg take  4 each am x 2 days,   2 each am x 2 days,  1 each am x2days and stop   Qvar 40 Take 2 puffs first thing in am and then another 2 puffs about 12 hours later.   Follow up in 2 weeks and As needed

## 2011-12-11 NOTE — Patient Instructions (Addendum)
You have been scheduled for an endoscopy. Please follow written instructions given to you at your visit today. If you use inhalers (even only as needed), please bring them with you on the day of your procedure.  You have been scheduled for a Barium Esophogram and Modified Barium Esophagram at [facility] (Radiology department) on 12/17/11 at 10:45am. Please arrive 15 minutes prior to your appointment for registration. Make certain not to have anything to eat or drink 6 hours prior to your test. If you need to reschedule for any reason, please contact radiology at [phone] to do so.  cc: Sandford Craze, NP

## 2011-12-15 ENCOUNTER — Telehealth: Payer: Self-pay | Admitting: Pulmonary Disease

## 2011-12-15 ENCOUNTER — Encounter: Payer: Self-pay | Admitting: Pulmonary Disease

## 2011-12-15 DIAGNOSIS — G4733 Obstructive sleep apnea (adult) (pediatric): Secondary | ICD-10-CM

## 2011-12-15 NOTE — Telephone Encounter (Signed)
CPAP 06/18/11 to 12/07/11>>Used on 152 of 173 nights with average 6 hrs 15 min.    Will have my nurse inform pt that CPAP report only shows compliance data, but does not provide information about sleep apnea control.  Will therefore arrange for auto CPAP titration at home.  Will call her again once this report is reviewed.    I have sent order to [facility] for auto CPAP titration.

## 2011-12-17 ENCOUNTER — Ambulatory Visit (HOSPITAL_COMMUNITY)
Admission: RE | Admit: 2011-12-17 | Discharge: 2011-12-17 | Disposition: A | Discharge status: Home / Self Care | Payer: Managed Care, Other (non HMO) | Source: Ambulatory Visit | Attending: Gastroenterology | Admitting: Gastroenterology

## 2011-12-17 ENCOUNTER — Ambulatory Visit (HOSPITAL_COMMUNITY)
Admission: RE | Admit: 2011-12-17 | Discharge: 2011-12-17 | Disposition: A | Discharge status: Home / Self Care | Payer: Managed Care, Other (non HMO) | Source: Ambulatory Visit | Attending: Family | Admitting: Family

## 2011-12-17 DIAGNOSIS — R1319 Other dysphagia: Secondary | ICD-10-CM

## 2011-12-17 DIAGNOSIS — K219 Gastro-esophageal reflux disease without esophagitis: Secondary | ICD-10-CM

## 2011-12-17 DIAGNOSIS — R1310 Dysphagia, unspecified: Secondary | ICD-10-CM

## 2011-12-17 NOTE — Procedures (Signed)
Objective Swallowing Evaluation: Modified Barium Swallowing Study  Patient Details  Name: Amber Salas MRN: [id number] Date of Birth: 02-Feb-1976  Today's Date: 12/17/2011 Time: 1105-1130 SLP Time Calculation (min): 25 min  Past Medical History:  Past Medical History  Diagnosis Date  . Asthma     history of  . Murmur, cardiac   . Retinal hole or tear     left eye- 1996 due to injury  . Obstructive sleep apnea on CPAP   . Morbid obesity   . Arrhythmia   . OSA (obstructive sleep apnea) 08/30/2010   Past Surgical History:  Past Surgical History  Procedure Date  . Cesarean section 4/04  . Hernia repair 04/2009    abdominal hernia  . Wisdom tooth extraction 1999  . Eye surgery 11/2010    lasar repair of retinal tear  . Tubal ligation    HPI:  36 year old female with reported PMH of hernia repair, tubal ligation, wisdom teeth removal and recent diagnosis of GERD, started on protonix. Patient reports a 3 or more month h/o globus, regurgitation of pos, and coughing with both liquids and solids as well as hoarse vocal quality. Barium swallow scheduled for after todays exam.      Assessment / Plan / Recommendation Clinical Impression  Dysphagia Diagnosis: Suspected primary esophageal dysphagia Clinical impression: Patient presents with a suspected primary esophageal dysphagia given c/o globus, regurgitation, hoarse vocal quality and recent diagnosis of GERD combined with what appeared to be an increasingly tight UES with very trace cricopharyngeal residuals as study progressed and c/o globus worsened. Oropharyngeal swallow is normal. Educated patient on general esophageal and reflux precautions. No f/u SLP needs indicated. Barium swallow results will be beneficial.     Treatment Recommendation  No treatment recommended at this time    Diet Recommendation Regular;Thin liquid (avoid dry solids)   Liquid Administration via: Cup;Straw Medication Administration: Whole meds with  liquid Supervision: Patient able to self feed Compensations: Slow rate;Small sips/bites;Follow solids with liquid Postural Changes and/or Swallow Maneuvers: Seated upright 90 degrees;Upright 30-60 min after meal    Other  Recommendations Recommended Consults:  (patient already with scheduled barium swallow and EGD) Oral Care Recommendations: Oral care BID   Follow Up Recommendations  None      General HPI: 36 year old female with reported PMH of hernia repair, tubal ligation, wisdom teeth removal and recent diagnosis of GERD, started on protonix. Patient reports a 3 or more month h/o globus, regurgitation of pos, and coughing with both liquids and solids as well as hoarse vocal quality. Barium swallow scheduled for after todays exam.  Type of Study: Modified Barium Swallowing Study Reason for Referral: Objectively evaluate swallowing function Diet Prior to this Study: Regular;Thin liquids Temperature Spikes Noted: No Respiratory Status: Room air History of Recent Intubation: No Behavior/Cognition: Alert;Cooperative;Pleasant mood Oral Cavity - Dentition: Adequate natural dentition Oral Motor / Sensory Function: Within functional limits Self-Feeding Abilities: Able to feed self Patient Positioning: Upright in chair Baseline Vocal Quality: Hoarse Volitional Swallow: Able to elicit Anatomy: Within functional limits Pharyngeal Secretions: Not observed secondary MBS    Reason for Referral Objectively evaluate swallowing function   Ferdinand Lango MA, CCC-SLP [phone]           Amber Salas 12/17/2011, 11:36 AM

## 2011-12-18 NOTE — Telephone Encounter (Signed)
I spoke with patient about results and she verbalized understanding and had no questions. She is aware order has been sent to [facility]. nothing further was needed

## 2011-12-19 ENCOUNTER — Telehealth: Payer: Self-pay | Admitting: Family

## 2011-12-19 NOTE — Telephone Encounter (Signed)
Left message on home # to return my call. 

## 2011-12-19 NOTE — Telephone Encounter (Signed)
Please let pt know that her swallow study looks good overall. She should plan to keep her upcoming apts with GI.  Speech therapist recommends following dietary modifications:  Diet Recommendation Regular;Thin liquid (avoid dry solids)  Liquid Administration via: Cup;Straw  Medication Administration: Whole meds with liquid  Supervision: Patient able to self feed  Compensations: Slow rate;Small sips/bites;Follow solids with liquid  Postural Changes and/or Swallow Maneuvers: Seated upright 90 degrees;Upright 30-60 min after meal

## 2011-12-19 NOTE — Telephone Encounter (Signed)
Notified pt. 

## 2011-12-29 ENCOUNTER — Encounter: Payer: Self-pay | Admitting: *Deleted

## 2011-12-29 ENCOUNTER — Ambulatory Visit (INDEPENDENT_AMBULATORY_CARE_PROVIDER_SITE_OTHER): Payer: Managed Care, Other (non HMO) | Admitting: Adult Health

## 2011-12-29 ENCOUNTER — Encounter: Payer: Self-pay | Admitting: Adult Health

## 2011-12-29 VITALS — BP 140/82 | HR 102 | Temp 97.9°F | Ht 67.0 in | Wt >= 6400 oz

## 2011-12-29 DIAGNOSIS — R05 Cough: Secondary | ICD-10-CM

## 2011-12-29 NOTE — Progress Notes (Signed)
Subjective:    Patient ID: Amber Salas, female    DOB: 27-Dec-1975, 36 y.o.   MRN: [id number] PCP is O'SULLIVAN,MELISSA S., NP  Body mass index is 63.43 kg/(m^2).  Never smoker.  Known OSA - on cpap; managed by PMD.    IOV 10/29/2011/ Ramaswamy Baseline morbidly obese female who has gained 30# in few years since stopped exercising. Interested in weight loss  Baseline OSA managed on cpap by PMD  At baseline: Hx of ashma x 6 years. Dx in [place], [place] by a primary care physician on basis of symptoms nos and relief with albuterol nebulizer prn. There is no hx of having had PFTs. STarted on symbicort 3 years ago in [place] and reports relief. Typical asthma symptoms are dyspnea, gasping for air, cough, wheezing and resulting in vomiting. Symptoms are episodic including at rest, fumes, car smoke, perfumes. Symptoms are relieved by cppap. At baselin asthma symptom severity is moderate.  There is hx of 8 pred bursts x 12 months  At baseline, does have associated SINUS DRAINAGE and OCC. GERD.   At baseline also has chronic cough that is of moderate severity. 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  -  C/o dyspnea. having paroxysmal nocturnal dyspnea x 2 - 6 months several times a week. Gets up choking. Dyspnea is associated with cough x 2-6 months. Symbicort not helping that well this year. 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 Cough differentiator suggests neurogenic/airway cough worse (score 5 of 5), than GERD (score 3 of 5).       Dr Gretta Cool Reflux Symptom Index (> 13-15 suggestive of LPR cough) 0 -> 5  =  none ->severe problem  Hoarseness of problem with voice 5  Clearing  Of Throat 5  Excess throat mucus or feeling of post nasal drip 5  Difficulty swallowing food, liquid or tablets 1  Cough after eating or lying down 2  Breathing difficulties or choking episodes 5  Troublesome or annoying  cough 3  Sensation of something sticking in throat or lump in throat 4  Heartburn, chest pain, indigestion, or stomach acid coming up 3  TOTAL 33     Kouffman Reflux v Neurogenic Cough Differentiator Reflux Neurogenic Comments  Do you awaken from a sound sleep coughing violently?                            With trouble breathing? Yes    Do you have choking episodes when you cannot  Get enough air, gasping for air ?              Yes    Do you usually cough when you lie down into  The bed, or when you just lie down to rest ?                          Yes    Do you usually cough after meals or eating?             Do you cough when (or after) you bend over?        Do you more-or-less cough all day long?  Yes   Does change of temperature make you cough?  Yes   Does laughing or chuckling cause you to cough?  YEs  Do fumes (perfume, automobile fumes, burned  Toast, etc.,) cause you to cough ?       Yes   Does speaking, singing, or talking on the phone cause you to cough   ?                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 Seen last ov for pulmonary consult for asthma and chronic cough.  Underwent PFTs on 6/28>FEV1 2.70 L/87% -mid flows with 26% change after SABA , ratio 77  Decreased DLCO at 58%.  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  Restarted  on symbicort.   She returns > minimal improvement in cough. Still has coughing fits to point of vomiting.  Feels short of breat on/off.  Went to ER last week with neg cardiac enzymes, and D. Dimer.  CXR clear.  We discussed her PFT results.  She is still out of work. 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  Hx of OSA , referred to Dr. Craige Cotta later this month for sleep consult.  No exertional chest pain, edema or fever.  rec Begin Delsym 2 tsp  Twice daily   Begin Tessalon 200mg  1 Three times a day   May use Hydromet 1 tsp every 4 hr .As needed  For breakthrough cough.  Add Pepcid 20mg  At bedtime   Continue on Protonix 40mg  daily before meal  Begin Chlortrimeton 4mg  2 At bedtime   Continue on Zyrtec 10mg  daily in am .  Goal is not to cough or clear throat- use sugarless candy, water. -NO MINTS  GERD diet.  Continue on Symbicort 160/4.90mcg 2 puffs Twice daily  -brush/rinse/gargle after use    12/02/2011 f/u ov/Wert not able to sustain s prednisone since June 2012 and finished last course x 2 weeks prior to OV  Cc 7/18 increase cough to point of vomiting. But even before flare still needed nebulizer around lunchtime plus saba q3-4 h including in the car on the way over for ov. Ran out singulair about a week before onset ? Worse, not sure. Taking protonix ac and pepcid 20 mg at hs.  Cough tends to be worse p supper before lie down and much worse at hs x 3-4 days prior to OV . >>changed to QVAR , rx pred taper along with Protonix and tramadol   12/10/11 Follow up  Returns for follow up  1 week follow up - reports SOB is improved with qvar,  but dry cough is slightly worse b/c unable to get the pred, tramadol, protonix She did not have the money for the rx until now . She is planning on picking them up in 2 days  She is feeling better w/ decreased dyspnea. Cough is unchanged No fever or hemoptysis .  Had thyroid US w/ thyroid cysts noted, no dominant cysts or thyromegaly .  >>cough control , continued on QVAR , and finish steroid taper.   12/29/2011 Follow up and med review.  We reviewed all her meds and organized them into a med calendar with pt education  She is feeling better with decreased cough.  No fever or discolored mucus  Has upcoming Endo per GI in am .     ROS:  Constitutional:   No  weight loss, night sweats,  Fevers, chills, + fatigue, or  lassitude.  HEENT:   No headaches,  Difficulty swallowing,  Tooth/dental  problems, or  Sore throat,                No sneezing, itching, ear ache, nasal congestion, post nasal drip,   CV:  No chest pain,  Orthopnea, PND, swelling in lower extremities, anasarca, dizziness, palpitations, syncope.   GI  No heartburn, indigestion, abdominal pain, nausea, vomiting, diarrhea, change in bowel habits, loss of appetite, bloody stools.   Resp:  No coughing up of blood.    No chest wall deformity  Skin: no rash or lesions.  GU: no dysuria, change in color of urine, no urgency or frequency.  No flank pain, no hematuria   MS:  No joint pain or swelling.  No decreased range of motion.    Psych:  No change in mood or affect. No depression or anxiety.  No memory loss.             Objective:  GEN: A/Ox3; pleasant , NAD, morbidly obese   HEENT:  [place]/AT,  EACs-clear, TMs-wnl, NOSE-pale , clear drainage  THROAT-clear, no lesions, no postnasal drip or exudate noted.   NECK:  Supple w/ fair ROM; no JVD; normal carotid impulses w/o bruits; no thyromegaly or  lymphadenopathy.  RESP  Clear  P & A; w/o, wheezes/ rales/ or rhonchi.no accessory muscle use, no dullness to percussion  CARD:  RRR, no m/r/g  , tr peripheral edema, pulses intact, no cyanosis or clubbing.  GI:   Soft & nt; nml bowel sounds; no organomegaly or masses detected.  Musco: Warm bil, no deformities or joint swelling noted.   Neuro: alert, no focal deficits noted.    Skin: Warm, no lesions or rashes

## 2011-12-29 NOTE — Patient Instructions (Addendum)
May return back to work on 01/06/12 .  Continue on current regimen.  follow up Dr. Marchelle Gearing in 1 month and As needed   Follow med calendar closely and bring to each visit.

## 2011-12-29 NOTE — Assessment & Plan Note (Signed)
Recent flare improved w/ tx aimed at tirgger control -GERD Amber Salas /cough   Plan;  Cont on current regimen Patient's medications were reviewed today and patient education was given. Computerized medication calendar was adjusted/completed  follow up Dr. Marchelle Gearing in 1 month and As needed

## 2011-12-30 ENCOUNTER — Ambulatory Visit (HOSPITAL_COMMUNITY)
Admission: RE | Admit: 2011-12-30 | Discharge: 2011-12-30 | Disposition: A | Discharge status: Home / Self Care | Payer: Managed Care, Other (non HMO) | Source: Ambulatory Visit | Attending: Gastroenterology | Admitting: Gastroenterology

## 2011-12-30 ENCOUNTER — Encounter (HOSPITAL_COMMUNITY): Payer: Self-pay | Admitting: *Deleted

## 2011-12-30 ENCOUNTER — Encounter (HOSPITAL_COMMUNITY)
Admission: RE | Disposition: A | Discharge status: Home / Self Care | Payer: Self-pay | Source: Ambulatory Visit | Attending: Gastroenterology

## 2011-12-30 DIAGNOSIS — K219 Gastro-esophageal reflux disease without esophagitis: Secondary | ICD-10-CM

## 2011-12-30 DIAGNOSIS — R1319 Other dysphagia: Secondary | ICD-10-CM

## 2011-12-30 DIAGNOSIS — G4733 Obstructive sleep apnea (adult) (pediatric): Secondary | ICD-10-CM | POA: Insufficient documentation

## 2011-12-30 DIAGNOSIS — R0989 Other specified symptoms and signs involving the circulatory and respiratory systems: Secondary | ICD-10-CM

## 2011-12-30 DIAGNOSIS — F458 Other somatoform disorders: Secondary | ICD-10-CM

## 2011-12-30 DIAGNOSIS — Z79899 Other long term (current) drug therapy: Secondary | ICD-10-CM | POA: Insufficient documentation

## 2011-12-30 DIAGNOSIS — R1310 Dysphagia, unspecified: Secondary | ICD-10-CM | POA: Insufficient documentation

## 2011-12-30 DIAGNOSIS — J45909 Unspecified asthma, uncomplicated: Secondary | ICD-10-CM | POA: Insufficient documentation

## 2011-12-30 DIAGNOSIS — R09A2 Foreign body sensation, throat: Secondary | ICD-10-CM

## 2011-12-30 HISTORY — PX: ESOPHAGOGASTRODUODENOSCOPY: SHX5428

## 2011-12-30 HISTORY — DX: Personal history of other diseases of the digestive system: Z87.19

## 2011-12-30 HISTORY — DX: Gastro-esophageal reflux disease without esophagitis: K21.9

## 2011-12-30 HISTORY — DX: Headache: R51

## 2011-12-30 SURGERY — EGD (ESOPHAGOGASTRODUODENOSCOPY)
Anesthesia: Moderate Sedation

## 2011-12-30 MED ORDER — FENTANYL CITRATE 0.05 MG/ML IJ SOLN
INTRAMUSCULAR | Status: DC | PRN
  Administered 2011-12-30: 10 ug via INTRAVENOUS
  Administered 2011-12-30: 15 ug via INTRAVENOUS
  Administered 2011-12-30 (×2): 25 ug via INTRAVENOUS

## 2011-12-30 MED ORDER — MIDAZOLAM HCL 10 MG/2ML IJ SOLN
INTRAMUSCULAR | Status: AC
  Filled 2011-12-30: qty 2

## 2011-12-30 MED ORDER — FENTANYL CITRATE 0.05 MG/ML IJ SOLN
INTRAMUSCULAR | Status: AC
  Filled 2011-12-30: qty 2

## 2011-12-30 MED ORDER — MIDAZOLAM HCL 10 MG/2ML IJ SOLN
INTRAMUSCULAR | Status: DC | PRN
  Administered 2011-12-30 (×3): 2 mg via INTRAVENOUS
  Administered 2011-12-30 (×2): 1 mg via INTRAVENOUS

## 2011-12-30 MED ORDER — SODIUM CHLORIDE 0.9 % IV SOLN
INTRAVENOUS | Status: DC
  Administered 2011-12-30: 10:00:00 via INTRAVENOUS

## 2011-12-30 MED ORDER — BUTAMBEN-TETRACAINE-BENZOCAINE 2-2-14 % EX AERO
INHALATION_SPRAY | CUTANEOUS | Status: DC | PRN
  Administered 2011-12-30: 2 via TOPICAL

## 2011-12-30 NOTE — H&P (View-Only) (Signed)
History of Present Illness: This is a 36-year-old female who has asthma and a chronic cough. She has been treated with multiple bursts of prednisone over the past year. She relates a history of frequent heartburn over the past 1-2 years. She notes regular difficulty swallowing solids and liquids. She feels that both solids and liquids stick in her throat. She has episodes of severe coughing that leads to nausea and vomiting. She was treated with pantoprazole daily for several weeks with improvement in her heartburn but no improvement of her other symptoms. Her pantoprazole was recently increased to twice a day and Pepcid was added at night and she has not noted improvement in her other symptoms. Denies weight loss, abdominal pain, constipation, diarrhea, change in stool caliber, melena, hematochezia, chest pain.  Review of Systems: Pertinent positive and negative review of systems were noted in the above HPI section. All other review of systems were otherwise negative.  Current Medications, Allergies, Past Medical History, Past Surgical History, Family History and Social History were reviewed in [facility] electronic medical record.  Physical Exam: General: Well developed , well nourished, obese, no acute distress Head: Normocephalic and atraumatic Eyes:  sclerae anicteric, EOMI Ears: Normal auditory acuity Mouth: No deformity or lesions Neck: Supple, no masses or thyromegaly Lungs: Clear throughout to auscultation Heart: Regular rate and rhythm; no murmurs, rubs or bruits Abdomen: Soft, non tender and non distended. No masses, hepatosplenomegaly or hernias noted. Normal Bowel sounds Musculoskeletal: Symmetrical with no gross deformities  Skin: No lesions on visible extremities Pulses:  Normal pulses noted Extremities: No clubbing, cyanosis, edema or deformities noted Neurological: Alert oriented x 4, grossly nonfocal Cervical Nodes:  No significant cervical adenopathy Inguinal Nodes: No  significant inguinal adenopathy Psychological:  Alert and cooperative. Normal mood and affect  Assessment and Recommendations:  1. Chronic cough with asthma. Occasional episodes of coughing spells lead to vomiting. She appears to have GERD which may be contributing to her cough and asthma. Standard antireflux measures. Continue pantoprazole 40 mg twice a day and Pepcid 20 mg at bedtime.   2. The cause of her dysphasia is not clear. This may be globus sensation. Rule out oropharyngeal and esophageal disorders such as Candida esophagitis, reflux esophagitis or motility disorder. Schedule a modified barium swallow study and barium esophagram. Schedule upper endoscopy with possible dilation. The risks, benefits, and alternatives to endoscopy with possible biopsy and possible dilation were discussed with the patient and they consent to proceed. She is at higher risk for complications and intubation would be difficult in the rare event that it is needed due to her obesity so that procedure will be scheduled at the hospital. If there are findings on the barium studies that allow us to defer endoscopy we will do so.  3. Obstructive sleep apnea  4. Asthma 

## 2011-12-30 NOTE — Interval H&P Note (Signed)
History and Physical Interval Note:  12/30/2011 9:36 AM  Amber Salas  has presented today for surgery, with the diagnosis of Dysphagia [787.20]  The various methods of treatment have been discussed with the patient and family. After consideration of risks, benefits and other options for treatment, the patient has consented to  Procedure(s) (LRB): ESOPHAGOGASTRODUODENOSCOPY (EGD) (N/A) as a surgical intervention .  The patient's history has been reviewed, patient examined, no change in status, stable for surgery.  I have reviewed the patient's chart and labs.  Questions were answered to the patient's satisfaction.     Venita Lick. Russella Dar MD Clementeen Graham

## 2011-12-30 NOTE — Op Note (Signed)
[facility] [address] [place], [address]   ENDOSCOPY PROCEDURE REPORT  PATIENT: Amber Salas, Amber Salas  MR#: [id number] BIRTHDATE: 10/20/1975 , 35  yrs. old GENDER: Female ENDOSCOPIST: [name] Dare, MD, [facility] REFERRED BY:  Daryel Gerald, N.P. PROCEDURE DATE:  12/30/2011 PROCEDURE:  EGD w/ dilation of esophagus via guidewire ASA CLASS:     Class II INDICATIONS:  dysphagia, GERD, globus sensation MEDICATIONS: These medications were titrated to patient response per physician's verbal order, Fentanyl 75 mcg IV, and Versed 8 mg IV TOPICAL ANESTHETIC: Cetacaine Spray DESCRIPTION OF PROCEDURE: After the risks benefits and alternatives of the procedure were thoroughly explained, informed consent was obtained.  The Pentax Gastroscope [id number] endoscope was introduced through the mouth and advanced to the second portion of the duodenum , limited by Without limitations. The instrument was slowly withdrawn as the mucosa was fully examined.   ESOPHAGUS: The mucosa of the esophagus appeared normal.   A 17 mm Savary dilator was passed over a guidewire for dysphagia without a stricture. No resistance and no heme noted.  STOMACH: The mucosa of the stomach appeared normal.   DUODENUM: The duodenal mucosa showed no abnormalities.  Retroflexed views revealed no abnormalities. The scope was then withdrawn from the patient and the procedure completed.  COMPLICATIONS: There were no complications.  ENDOSCOPIC IMPRESSION: The EGD appeared normal: empiric dilation performed  RECOMMENDATIONS: 1.  Anti-reflux regimen 2.  PPI bid and H2RA hs 3.  Post dilation instructions   eSigned:  [name] Dare, MD, [facility] 12/30/2011 10:25 AM     [place] Copy]

## 2011-12-31 ENCOUNTER — Encounter (HOSPITAL_COMMUNITY): Payer: Self-pay

## 2011-12-31 ENCOUNTER — Encounter (HOSPITAL_COMMUNITY): Payer: Self-pay | Admitting: Gastroenterology

## 2012-01-05 ENCOUNTER — Encounter: Payer: Self-pay | Admitting: *Deleted

## 2012-01-05 ENCOUNTER — Telehealth: Payer: Self-pay | Admitting: Internal Medicine

## 2012-01-05 NOTE — Telephone Encounter (Signed)
That is fine 

## 2012-01-05 NOTE — Telephone Encounter (Signed)
Last OV on 12-29-11, next OV 02/09/12. I spoke with the patient and she states at last OV Tammy advised she could return back to work on 01-06-12. Pt wants to know can it be approved for her to return to work "hal-time" for 3 months. 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 Pt states overall the cough is improved, but gets worse with a lot of talking.  Pt states she spoke with her supervisor and they can support half time. Please advise if it is ok to complete letter and I will type it up. Thanks. Carron Curie, CMA

## 2012-01-05 NOTE — Telephone Encounter (Signed)
Pt aware, letter completed and is at front.Carron Curie, CMA

## 2012-01-07 NOTE — Addendum Note (Signed)
Addended by: Orma Flaming D on: 01/07/2012 04:04 PM   Modules accepted: Orders

## 2012-01-19 ENCOUNTER — Encounter: Payer: Self-pay | Admitting: Family

## 2012-01-19 ENCOUNTER — Ambulatory Visit (INDEPENDENT_AMBULATORY_CARE_PROVIDER_SITE_OTHER): Payer: Managed Care, Other (non HMO) | Admitting: Family

## 2012-01-19 ENCOUNTER — Telehealth: Payer: Self-pay | Admitting: *Deleted

## 2012-01-19 VITALS — BP 110/80 | HR 102 | Temp 98.9°F | Resp 16 | Ht 65.98 in | Wt >= 6400 oz

## 2012-01-19 DIAGNOSIS — J069 Acute upper respiratory infection, unspecified: Secondary | ICD-10-CM

## 2012-01-19 NOTE — Patient Instructions (Addendum)

## 2012-01-19 NOTE — Progress Notes (Signed)
Subjective:    Patient ID: Amber Salas, female    DOB: 11/17/1975, 36 y.o.   MRN: [id number]  HPI  [name] is a 36 yr old female who presents today with report of nasal congestion, sinus drainage, sneezing and chest congestion since Friday. She denies fever.  Overall asthma has been much better.  She is now taking qvar in place of symbicort and protonix which has helped a lot. Her son has similar cold symptoms.     Review of Systems See HPI  Past Medical History  Diagnosis Date  . Asthma     history of  . Murmur, cardiac   . Retinal hole or tear     left eye- 1996 due to injury  . Obstructive sleep apnea on CPAP   . Morbid obesity   . Arrhythmia   . OSA (obstructive sleep apnea) 08/30/2010  . GERD (gastroesophageal reflux disease)   . H/O hiatal hernia   . Headache     migraines    History   Social History  . Marital Status: Married    Spouse Name: N/A    Number of Children: 3  . Years of Education: N/A   Occupational History  . CUSTOMER SERVICE [employer]   Social History Main Topics  . Smoking status: Never Smoker   . Smokeless tobacco: Never Used  . Alcohol Use: No  . Drug Use: No  . Sexually Active: Yes    Birth Control/ Protection: Surgical   Other Topics Concern  . 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    Past Surgical History  Procedure Date  . Cesarean section 4/04  . Hernia repair 04/2009    abdominal hernia  . Wisdom tooth extraction 1999  . Eye surgery 11/2010    lasar repair of retinal tear  . Tubal ligation   . Esophagogastroduodenoscopy 12/30/2011    Procedure: ESOPHAGOGASTRODUODENOSCOPY (EGD);  Surgeon: [name] Dare, MD,FACG;  Location: [facility];  Service: Endoscopy;  Laterality: N/A;    Family History  Problem Relation Age of Onset  . Hypertension Mother   . Diabetes Mother     type II  . Other  Mother     cervical dysplasia  . Ovarian cancer Mother   . Hypertension Father   . Bipolar disorder Father   . Heart disease Maternal Grandmother   . Hypertension Maternal Grandmother   . Bipolar disorder Maternal Grandmother   . Hypertension Maternal Grandfather   . Hypertension Paternal Grandmother   . Hypertension Paternal Grandfather     Allergies  Allergen Reactions  . Fluticasone-Salmeterol Palpitations and Other (See Comments)    chest pain  . Latex Rash and Other (See Comments)    wheezing  . Spinach Rash    Current Outpatient Prescriptions on File Prior to Visit  Medication Sig Dispense Refill  . albuterol (PROVENTIL HFA;VENTOLIN HFA) 108 (90 BASE) MCG/ACT inhaler Inhale 2 puffs into the lungs every 4 (four) hours as needed. For shortness of breath or wheezing      . albuterol (PROVENTIL) (2.5 MG/3ML) 0.083% nebulizer solution Take 3 mLs (2.5 mg total) by nebulization every 4 (four) hours as needed. For shortness of breath or wheezing  75 mL  5  . beclomethasone (QVAR) 40 MCG/ACT inhaler Inhale 2 puffs into the lungs 2 (two) times daily. Take 2 puffs first thing in am and  then another 2 puffs about 12 hours later.  1 Inhaler  11  . benzonatate (TESSALON) 200 MG capsule Take 1 capsule by mouth Three times daily as needed.      . cetirizine (ZYRTEC) 10 MG tablet Take 10 mg by mouth daily.      [name] dextromethorphan (DELSYM) 30 MG/5ML liquid Take 60 mg by mouth as needed. 2 tsp every 12 hrs as needed      . famotidine (PEPCID) 20 MG tablet Take 20 mg by mouth at bedtime.      [name] ibuprofen (ADVIL,MOTRIN) 200 MG tablet Take 200 mg by mouth once as needed. For pain--take as directed on bottle      . Multiple Vitamin (MULITIVITAMIN WITH MINERALS) TABS Take 1 tablet by mouth daily.      . Olopatadine HCl (PATADAY) 0.2 % SOLN Place 1 drop into both eyes as needed. For allergies      . pantoprazole (PROTONIX) 40 MG tablet Take 30- 60 min before your first and last meals of the day  60  tablet  6  . traMADol (ULTRAM) 50 MG tablet Take 50 mg by mouth every 4 (four) hours as needed.        BP 110/80  Pulse 102  Temp 98.9 F (37.2 C) (Oral)  Resp 16  Ht 5' 5.98" (1.676 m)  Wt 404 lb 1.3 oz (183.289 kg)  BMI 65.26 kg/m2  SpO2 99%       Objective:   Physical Exam  Constitutional: She appears well-developed and well-nourished. No distress.  HENT:  Head: Normocephalic and atraumatic.  Right Ear: Tympanic membrane and ear canal normal.  Left Ear: Tympanic membrane and ear canal normal.  Mouth/Throat: No oropharyngeal exudate, posterior oropharyngeal edema, posterior oropharyngeal erythema or tonsillar abscesses.  Cardiovascular: Normal rate and regular rhythm.   No murmur heard. Pulmonary/Chest: Effort normal and breath sounds normal. No respiratory distress. She has no wheezes. She has no rales. She exhibits no tenderness.  Skin: Skin is warm and dry.  Psychiatric: She has a normal mood and affect. Her behavior is normal. Judgment and thought content normal.          Assessment & Plan:

## 2012-01-19 NOTE — Telephone Encounter (Signed)
Received call from pt stating her son has a sinus infection and she feels that she is catching it as well. Requested abx. Advised pt of need for evaluation before antibiotic can be called in. Pt scheduled appt for today at 3:45pm.

## 2012-01-21 DIAGNOSIS — J069 Acute upper respiratory infection, unspecified: Secondary | ICD-10-CM | POA: Insufficient documentation

## 2012-01-21 NOTE — Assessment & Plan Note (Signed)
Symptoms most consistent with URI at this point.  I advised pt to call if symptoms worsen or if no improvement in 2-3 days.

## 2012-01-27 ENCOUNTER — Telehealth: Payer: Self-pay | Admitting: Internal Medicine

## 2012-01-27 ENCOUNTER — Telehealth: Payer: Self-pay | Admitting: Family

## 2012-01-27 NOTE — Telephone Encounter (Signed)
Received call from Dr. Otilio Miu who called on behalf of [employer] re: disability.  He said [name] wanted to know why pt was out of work from 7/22-8/26.  I directed him to speak with pt's pulmonologist who was managing her care and to my knowledge filled out paperwork during that time.

## 2012-01-27 NOTE — Telephone Encounter (Signed)
Dr Otilio Miu - pulmonary at [facility]. Patient authorized by [place] to be off work  12/01/11 - 01/05/12. He wanted to know that if there was documentation of cough during office vissits. I reviewed records and could not consistently see documentaton of cough during office visits. THere only is a history of cough.

## 2012-02-09 ENCOUNTER — Ambulatory Visit: Payer: Managed Care, Other (non HMO) | Admitting: Internal Medicine

## 2012-02-18 ENCOUNTER — Ambulatory Visit (INDEPENDENT_AMBULATORY_CARE_PROVIDER_SITE_OTHER): Payer: Managed Care, Other (non HMO) | Admitting: Internal Medicine

## 2012-02-18 ENCOUNTER — Encounter: Payer: Self-pay | Admitting: Internal Medicine

## 2012-02-18 VITALS — BP 140/80 | HR 98 | Temp 98.4°F | Ht 66.0 in | Wt >= 6400 oz

## 2012-02-18 DIAGNOSIS — Z23 Encounter for immunization: Secondary | ICD-10-CM

## 2012-02-18 DIAGNOSIS — R05 Cough: Secondary | ICD-10-CM

## 2012-02-18 NOTE — Patient Instructions (Addendum)
Glad  You are better IN order to sort out your asthma status better, stop your QVAR for 2 weeks and have methacholine challenge test If your breathing or cough gets worse, return sooner  Have flu shot today 02/18/2012 REturn after methacholine challenge test in in 2-3weeks

## 2012-02-18 NOTE — Progress Notes (Signed)
Subjective:    Patient ID: Amber Salas, female    DOB: [birth year], 36 y.o.   MRN: [id number]  HPI  PCP is O'SULLIVAN,MELISSA S., NP  Body mass index is 63.43 kg/(m^2).  Never smoker.  Known OSA - on cpap; managed by PMD.    IOV 10/29/2011/ Ramaswamy  Baseline morbidly obese female who has gained 30# in few years since stopped exercising. Interested in weight loss  Baseline OSA managed on cpap by PMD  At baseline: Hx of ashma x 6 years. Dx in [place], [place] by a primary care physician on basis of symptoms nos and relief with albuterol nebulizer prn. There is no hx of having had PFTs. STarted on symbicort 3 years ago in [place] and reports relief. Typical asthma symptoms are dyspnea, gasping for air, cough, wheezing and resulting in vomiting. Symptoms are episodic including at rest, fumes, car smoke, perfumes. Symptoms are relieved by cppap. At baselin asthma symptom severity is moderate.  There is hx of 8 pred bursts x 12 months  At baseline, does have associated SINUS DRAINAGE and OCC. GERD.   At baseline also has chronic cough that is of moderate severity. 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  -  C/o dyspnea. having paroxysmal nocturnal dyspnea x 2 - 6 months several times a week. Gets up choking. Dyspnea is associated with cough x 2-6 months. Symbicort not helping that well this year. 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 Cough differentiator suggests neurogenic/airway cough worse (score 5 of 5), than GERD (score 3 of 5).      LABS  - CXR 10/23/11 - clear    11/19/2011 Follow up  Returns for 4 week follow up . Seen last ov for pulmonary consult for asthma and chronic cough.  Underwent PFTs on 6/28>FEV1 2.70 L/87% -mid flows with 26% change after SABA , ratio 77  Decreased DLCO at 58%.  Last ov dx with cyclical cough secondary to LPR/GERD and PND  Placed on Flonase, Protonix, GERD  diet.  Restarted  on symbicort.   She returns > minimal improvement in cough. Still has coughing fits to point of vomiting.  Feels short of breat on/off.  Went to ER last week with neg cardiac enzymes, and D. Dimer.  CXR clear.  We discussed her PFT results.  She is still out of work. 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  Hx of OSA , referred to Dr. Craige Cotta later this month for sleep consult.  No exertional chest pain, edema or fever.  rec Begin Delsym 2 tsp Twice daily   Begin Tessalon 200mg  1 Three times a day   May use Hydromet 1 tsp every 4 hr .As needed  For breakthrough cough.  Add Pepcid 20mg  At bedtime   Continue on Protonix 40mg  daily before meal  Begin Chlortrimeton 4mg  2 At bedtime   Continue on Zyrtec 10mg  daily in am .  Goal is not to cough or clear throat- use sugarless candy, water. -NO MINTS  GERD diet.  Continue on Symbicort 160/4.82mcg 2 puffs Twice daily  -brush/rinse/gargle after use    12/02/2011 f/u ov/Wert not able to sustain s prednisone since June 2012 and finished last course x 2 weeks prior to OV  Cc 7/18 increase cough to point of vomiting. But even before flare still needed nebulizer around lunchtime plus  saba q3-4 h including in the car on the way over for ov. Ran out singulair about a week before onset ? Worse, not sure. Taking protonix ac and pepcid 20 mg at hs.  Cough tends to be worse p supper before lie down and much worse at hs x 3-4 days prior to OV . >>changed to QVAR , rx pred taper along with Protonix and tramadol   12/10/11 Follow up  Returns for follow up  1 week follow up - reports SOB is improved with qvar,  but dry cough is slightly worse b/c unable to get the pred, tramadol, protonix She did not have the money for the rx until now . She is planning on picking them up in 2 days  She is feeling better w/ decreased dyspnea. Cough is unchanged No fever or hemoptysis .  Had thyroid US w/ thyroid cysts noted, no dominant cysts  or thyromegaly .    12/29/2011 Follow up and med review.  We reviewed all her meds and organized them into a med calendar with pt education  She is feeling better with decreased cough.  No fever or discolored mucus  Has upcoming Endo per GI in am .  REC      May return back to work on 01/06/12 .  Continue on current regimen.  follow up Dr. Marchelle Gearing in 1 month and As needed   Follow med calendar closely and bring to each visit.    OV 02/18/2012 Presents for followup. Cough reportedly markedly better and today in office she is not coughing. However, RSI cough score is still very high at 31 and suggestiove of LPR cough. I think she is back at work. She says sihe is compliant with QVAR. Exhaled NO on QVAR - 20 and is LOW PROB for asthma v WELL CONTROLLED. Details of cough are below     Dr Gretta Cool Reflux Symptom Index (> 13-15 suggestive of LPR cough) 10/29/11 02/18/12  Hoarseness of problem with voice 5 4  Clearing  Of Throat 5 4  Excess throat mucus or feeling of post nasal drip 5 4  Difficulty swallowing food, liquid or tablets 1 3  Cough after eating or lying down 2 3  Breathing difficulties or choking episodes 5 3  Troublesome or annoying cough 3 3  Sensation of something sticking in throat or lump in throat 4 5  Heartburn, chest pain, indigestion, or stomach acid coming up 3 3  TOTAL 33 31      Kouffman Reflux v Neurogenic Cough Differentiator Reflux Comments  Do you awaken from a sound sleep coughing violently?                            With trouble breathing? No   Do you have choking episodes when you cannot  Get enough air, gasping for air ?              No   Do you usually cough when you lie down into  The bed, or when you just lie down to rest ?                          Yes   Do you usually cough after meals or eating?         Yes   Do you cough when (or after) you bend over?    Yes   GERD SCORE  3  Kouffman Reflux v Neurogenic Cough Differentiator Neurogenic   Do  you more-or-less cough all day long? No   Does change of temperature make you cough? y   Does laughing or chuckling cause you to cough? y   Do fumes (perfume, automobile fumes, burned  Toast, etc.,) cause you to cough ?      y   Does speaking, singing, or talking on the phone cause you to cough   ?               y   Neurogenic/Airway score 4      Review of Systems  Constitutional: Negative for fever and unexpected weight change.  HENT: Positive for congestion, sore throat, rhinorrhea, sneezing, trouble swallowing, postnasal drip and sinus pressure. Negative for ear pain, nosebleeds and dental problem.   Eyes: Negative for redness and itching.  Respiratory: Positive for cough and shortness of breath. Negative for chest tightness and wheezing.   Cardiovascular: Positive for chest pain, palpitations and leg swelling.  Gastrointestinal: Negative for nausea and vomiting.  Genitourinary: Negative for dysuria.  Musculoskeletal: Positive for joint swelling.  Skin: Negative for rash.  Neurological: Negative for headaches.  Hematological: Bruises/bleeds easily.  Psychiatric/Behavioral: Negative for dysphoric mood. The patient is not nervous/anxious.    Scheduled Meds:   Continuous Infusions:   PRN Meds:.   Current outpatient prescriptions:albuterol (PROVENTIL HFA;VENTOLIN HFA) 108 (90 BASE) MCG/ACT inhaler, Inhale 2 puffs into the lungs every 4 (four) hours as needed. For shortness of breath or wheezing, Disp: , Rfl: ;  albuterol (PROVENTIL) (2.5 MG/3ML) 0.083% nebulizer solution, Take 3 mLs (2.5 mg total) by nebulization every 4 (four) hours as needed. For shortness of breath or wheezing, Disp: 75 mL, Rfl: 5 beclomethasone (QVAR) 40 MCG/ACT inhaler, Inhale 2 puffs into the lungs 2 (two) times daily. Take 2 puffs first thing in am and then another 2 puffs about 12 hours later., Disp: 1 Inhaler, Rfl: 11;  benzonatate (TESSALON) 200 MG capsule, Take 1 capsule by mouth Three times daily as needed.,  Disp: , Rfl: ;  cetirizine (ZYRTEC) 10 MG tablet, Take 10 mg by mouth daily., Disp: , Rfl:  dextromethorphan (DELSYM) 30 MG/5ML liquid, Take 60 mg by mouth as needed. 2 tsp every 12 hrs as needed, Disp: , Rfl: ;  fluticasone (FLONASE) 50 MCG/ACT nasal spray, Place 1 spray into the nose daily., Disp: , Rfl: ;  ibuprofen (ADVIL,MOTRIN) 200 MG tablet, Take 200 mg by mouth once as needed. For pain--take as directed on bottle, Disp: , Rfl: ;  Multiple Vitamin (MULITIVITAMIN WITH MINERALS) TABS, Take 1 tablet by mouth daily., Disp: , Rfl:  Olopatadine HCl (PATADAY) 0.2 % SOLN, Place 1 drop into both eyes as needed. For allergies, Disp: , Rfl: ;  pantoprazole (PROTONIX) 40 MG tablet, Take 30- 60 min before your first meal of the day, Disp: , Rfl: ;  traMADol (ULTRAM) 50 MG tablet, Take 50 mg by mouth every 4 (four) hours as needed., Disp: , Rfl: ;  DISCONTD: pantoprazole (PROTONIX) 40 MG tablet, Take 30- 60 min before your first and last meals of the day, Disp: 60 tablet, Rfl: 6     Objective:   Physical Exam  Vitals reviewed. Constitutional: She is oriented to person, place, and time. She appears well-developed and well-nourished. No distress.       Body mass index is 66.26 kg/(m^2). No cough Children at her side  HENT:  Head: Normocephalic and atraumatic.  Right Ear: External ear normal.  Left  Ear: External ear normal.  Mouth/Throat: Oropharynx is clear and moist. No oropharyngeal exudate.  Eyes: Conjunctivae normal and EOM are normal. Pupils are equal, round, and reactive to light. Right eye exhibits no discharge. Left eye exhibits no discharge. No scleral icterus.  Neck: Normal range of motion. Neck supple. No JVD present. No tracheal deviation present. No thyromegaly present.  Cardiovascular: Normal rate, regular rhythm, normal heart sounds and intact distal pulses.  Exam reveals no gallop and no friction rub.   No murmur heard. Pulmonary/Chest: Effort normal and breath sounds normal. No  respiratory distress. She has no wheezes. She has no rales. She exhibits no tenderness.  Abdominal: Soft. Bowel sounds are normal. She exhibits no distension and no mass. There is no tenderness. There is no rebound and no guarding.  Musculoskeletal: Normal range of motion. She exhibits no edema and no tenderness.  Lymphadenopathy:    She has no cervical adenopathy.  Neurological: She is alert and oriented to person, place, and time. She has normal reflexes. No cranial nerve deficit. She exhibits normal muscle tone. Coordination normal.  Skin: Skin is warm and dry. No rash noted. She is not diaphoretic. No erythema. No pallor.  Psychiatric: She has a normal mood and affect. Her behavior is normal. Judgment and thought content normal.          Assessment & Plan:

## 2012-02-22 NOTE — Assessment & Plan Note (Addendum)
Cough reportedly better though RSI score does not show much difference. Exhaled nitric oxide suggests low prob for asthma v well controlled asthma  PLAN Glad  You are better IN order to sort out your asthma status better, stop your QVAR for 2 weeks and have methacholine challenge test If your breathing or cough gets worse, return sooner  Have flu shot today 02/18/2012 REturn after methacholine challenge test in in 2-3weeks

## 2012-02-25 ENCOUNTER — Ambulatory Visit: Payer: Managed Care, Other (non HMO) | Admitting: Family

## 2012-03-01 ENCOUNTER — Ambulatory Visit: Payer: Managed Care, Other (non HMO) | Admitting: Family

## 2012-03-01 DIAGNOSIS — Z0289 Encounter for other administrative examinations: Secondary | ICD-10-CM

## 2012-03-05 ENCOUNTER — Ambulatory Visit (HOSPITAL_COMMUNITY)
Admission: RE | Admit: 2012-03-05 | Discharge: 2012-03-05 | Disposition: A | Discharge status: Home / Self Care | Payer: Managed Care, Other (non HMO) | Source: Ambulatory Visit | Attending: Internal Medicine | Admitting: Internal Medicine

## 2012-03-05 DIAGNOSIS — R05 Cough: Secondary | ICD-10-CM

## 2012-03-05 DIAGNOSIS — R059 Cough, unspecified: Secondary | ICD-10-CM | POA: Insufficient documentation

## 2012-03-05 LAB — PULMONARY FUNCTION TEST

## 2012-03-05 MED ORDER — METHACHOLINE 0.0625 MG/ML NEB SOLN
2.0000 mL | Freq: Once | RESPIRATORY_TRACT | Status: AC
  Administered 2012-03-05: 0.125 mg via RESPIRATORY_TRACT

## 2012-03-05 MED ORDER — METHACHOLINE 0.25 MG/ML NEB SOLN
2.0000 mL | Freq: Once | RESPIRATORY_TRACT | Status: AC
  Administered 2012-03-05: 0.5 mg via RESPIRATORY_TRACT

## 2012-03-05 MED ORDER — METHACHOLINE 16 MG/ML NEB SOLN: 2.0000 mL | Freq: Once | RESPIRATORY_TRACT | Status: DC

## 2012-03-05 MED ORDER — METHACHOLINE 4 MG/ML NEB SOLN: 2.0000 mL | Freq: Once | RESPIRATORY_TRACT | Status: DC

## 2012-03-05 MED ORDER — METHACHOLINE 1 MG/ML NEB SOLN
2.0000 mL | Freq: Once | RESPIRATORY_TRACT | Status: AC
  Administered 2012-03-05: 2 mg via RESPIRATORY_TRACT

## 2012-03-05 MED ORDER — ALBUTEROL SULFATE (5 MG/ML) 0.5% IN NEBU
2.5000 mg | INHALATION_SOLUTION | Freq: Once | RESPIRATORY_TRACT | Status: AC
  Administered 2012-03-05: 2.5 mg via RESPIRATORY_TRACT

## 2012-03-05 MED ORDER — SODIUM CHLORIDE 0.9 % IN NEBU
3.0000 mL | INHALATION_SOLUTION | Freq: Once | RESPIRATORY_TRACT | Status: AC
  Administered 2012-03-05: 3 mL via RESPIRATORY_TRACT

## 2012-03-10 ENCOUNTER — Encounter: Payer: Self-pay | Admitting: *Deleted

## 2012-03-10 ENCOUNTER — Encounter: Payer: Self-pay | Admitting: Internal Medicine

## 2012-03-10 ENCOUNTER — Ambulatory Visit (INDEPENDENT_AMBULATORY_CARE_PROVIDER_SITE_OTHER): Payer: Managed Care, Other (non HMO) | Admitting: Internal Medicine

## 2012-03-10 VITALS — BP 136/88 | HR 105 | Temp 98.3°F | Ht 66.0 in | Wt >= 6400 oz

## 2012-03-10 DIAGNOSIS — J45909 Unspecified asthma, uncomplicated: Secondary | ICD-10-CM

## 2012-03-10 NOTE — Progress Notes (Signed)
Subjective:    Patient ID: Amber Salas, female    DOB: 01/30/1976, 36 y.o.   MRN: [id number]  HPI PCP is O'SULLIVAN,MELISSA S., NP  Body mass index is 63.43 kg/(m^2).  Never smoker.  Known OSA - on cpap; managed by PMD.    IOV 10/29/2011/ Ramaswamy  Baseline morbidly obese female who has gained 30# in few years since stopped exercising. Interested in weight loss  Baseline OSA managed on cpap by PMD  At baseline: Hx of ashma x 6 years. Dx in [place], [place] by a primary care physician on basis of symptoms nos and relief with albuterol nebulizer prn. There is no hx of having had PFTs. STarted on symbicort 3 years ago in [place] and reports relief. Typical asthma symptoms are dyspnea, gasping for air, cough, wheezing and resulting in vomiting. Symptoms are episodic including at rest, fumes, car smoke, perfumes. Symptoms are relieved by cppap. At baselin asthma symptom severity is moderate.  There is hx of 8 pred bursts x 12 months  At baseline, does have associated SINUS DRAINAGE and OCC. GERD.   At baseline also has chronic cough that is of moderate severity. 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  -  C/o dyspnea. having paroxysmal nocturnal dyspnea x 2 - 6 months several times a week. Gets up choking. Dyspnea is associated with cough x 2-6 months. Symbicort not helping that well this year. 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 Cough differentiator suggests neurogenic/airway cough worse (score 5 of 5), than GERD (score 3 of 5).      LABS  - CXR 10/23/11 - clear    11/19/2011 Follow up  Returns for 4 week follow up . Seen last ov for pulmonary consult for asthma and chronic cough.  Underwent PFTs on 6/28>FEV1 2.70 L/87% -mid flows with 26% change after SABA , ratio 77  Decreased DLCO at 58%.  Last ov dx with cyclical cough secondary to LPR/GERD and PND  Placed on Flonase, Protonix, GERD  diet.  Restarted  on symbicort.   She returns > minimal improvement in cough. Still has coughing fits to point of vomiting.  Feels short of breat on/off.  Went to ER last week with neg cardiac enzymes, and D. Dimer.  CXR clear.  We discussed her PFT results.  She is still out of work. 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  Hx of OSA , referred to Dr. Craige Cotta later this month for sleep consult.  No exertional chest pain, edema or fever.  rec Begin Delsym 2 tsp Twice daily   Begin Tessalon 200mg  1 Three times a day   May use Hydromet 1 tsp every 4 hr .As needed  For breakthrough cough.  Add Pepcid 20mg  At bedtime   Continue on Protonix 40mg  daily before meal  Begin Chlortrimeton 4mg  2 At bedtime   Continue on Zyrtec 10mg  daily in am .  Goal is not to cough or clear throat- use sugarless candy, water. -NO MINTS  GERD diet.  Continue on Symbicort 160/4.26mcg 2 puffs Twice daily  -brush/rinse/gargle after use    12/02/2011 f/u ov/Wert not able to sustain s prednisone since June 2012 and finished last course x 2 weeks prior to OV  Cc 7/18 increase cough to point of vomiting. But even before flare still needed nebulizer around lunchtime plus saba  q3-4 h including in the car on the way over for ov. Ran out singulair about a week before onset ? Worse, not sure. Taking protonix ac and pepcid 20 mg at hs.  Cough tends to be worse p supper before lie down and much worse at hs x 3-4 days prior to OV . >>changed to QVAR , rx pred taper along with Protonix and tramadol   12/10/11 Follow up  Returns for follow up  1 week follow up - reports SOB is improved with qvar,  but dry cough is slightly worse b/c unable to get the pred, tramadol, protonix She did not have the money for the rx until now . She is planning on picking them up in 2 days  She is feeling better w/ decreased dyspnea. Cough is unchanged No fever or hemoptysis .  Had thyroid US w/ thyroid cysts noted, no dominant cysts  or thyromegaly .    12/29/2011 Follow up and med review.  We reviewed all her meds and organized them into a med calendar with pt education  She is feeling better with decreased cough.  No fever or discolored mucus  Has upcoming Endo per GI in am .  REC      May return back to work on 01/06/12 .  Continue on current regimen.  follow up Dr. Marchelle Gearing in 1 month and As needed   Follow med calendar closely and bring to each visit.    OV 02/18/2012 Presents for followup. Cough reportedly markedly better and today in office she is not coughing. However, RSI cough score is still very high at 31 and suggestiove of LPR cough. I think she is back at work. She says sihe is compliant with QVAR. Exhaled NO on QVAR - 20 and is LOW PROB for asthma v WELL CONTROLLED. Details of cough are below   Glad You are better  IN order to sort out your asthma status better, stop your QVAR for 2 weeks and have methacholine challenge test  If your breathing or cough gets worse, return sooner  Have flu shot today 02/18/2012  REturn after methacholine challenge test in in 2-3weeks   OV 03/10/2012 REviewe methacholine - strongly poisitie for asthma. Will restart qvar  Also to discuss weight mgmt. Wants lif back. Calls herself cookie lover. Eats biscuits, sausages, fries for daily food. > 80% of her food is moderate or high glycemic. She says she is willing to make the changes in her diet needed to get her health back. She wants to go back to work. She has 3 young kids  And says she needs to live for them Estimated Body mass index is 67.14 kg/(m^2) as calculated from the following:   Height as of this encounter: 5\' 6" (1.676 m).   Weight as of this encounter: 416 lb(188.696 kg).    Dr Gretta Cool Reflux Symptom Index (> 13-15 suggestive of LPR cough) 10/29/11 02/18/12  Hoarseness of problem with voice 5 4  Clearing  Of Throat 5 4  Excess throat mucus or feeling of post nasal drip 5 4  Difficulty swallowing food,  liquid or tablets 1 3  Cough after eating or lying down 2 3  Breathing difficulties or choking episodes 5 3  Troublesome or annoying cough 3 3  Sensation of something sticking in throat or lump in throat 4 5  Heartburn, chest pain, indigestion, or stomach acid coming up 3 3  TOTAL 33 31      Kouffman Reflux v Neurogenic Cough  Differentiator Reflux Comments  Do you awaken from a sound sleep coughing violently?                            With trouble breathing? No   Do you have choking episodes when you cannot  Get enough air, gasping for air ?              No   Do you usually cough when you lie down into  The bed, or when you just lie down to rest ?                          Yes   Do you usually cough after meals or eating?         Yes   Do you cough when (or after) you bend over?    Yes   GERD SCORE  3   Kouffman Reflux v Neurogenic Cough Differentiator Neurogenic   Do you more-or-less cough all day long? No   Does change of temperature make you cough? y   Does laughing or chuckling cause you to cough? y   Do fumes (perfume, automobile fumes, burned  Toast, etc.,) cause you to cough ?      y   Does speaking, singing, or talking on the phone cause you to cough   ?               y   Neurogenic/Airway score 4      Review of Systems  Constitutional: Negative for fever and unexpected weight change.  HENT: Negative for ear pain, nosebleeds, congestion, sore throat, rhinorrhea, sneezing, trouble swallowing, dental problem, postnasal drip and sinus pressure.   Eyes: Negative for redness and itching.  Respiratory: Negative for cough, chest tightness, shortness of breath and wheezing.   Cardiovascular: Negative for palpitations and leg swelling.  Gastrointestinal: Negative for nausea and vomiting.  Genitourinary: Negative for dysuria.  Musculoskeletal: Negative for joint swelling.  Skin: Negative for rash.  Neurological: Negative for headaches.  Hematological: Does not bruise/bleed  easily.  Psychiatric/Behavioral: Negative for dysphoric mood. The patient is not nervous/anxious.        Objective:   Physical Exam Vitals reviewed. Constitutional: She is oriented to person, place, and time. She appears well-developed and well-nourished. No distress.       Body mass index is 66.26 kg/(m^2). No cough Children at her side  HENT:  Head: Normocephalic and atraumatic.  Right Ear: External ear normal.  Left Ear: External ear normal.  Mouth/Throat: Oropharynx is clear and moist. No oropharyngeal exudate.  Eyes: Conjunctivae normal and EOM are normal. Pupils are equal, round, and reactive to light. Right eye exhibits no discharge. Left eye exhibits no discharge. No scleral icterus.  Neck: Normal range of motion. Neck supple. No JVD present. No tracheal deviation present. No thyromegaly present.  Cardiovascular: Normal rate, regular rhythm, normal heart sounds and intact distal pulses.  Exam reveals no gallop and no friction rub.   No murmur heard. Pulmonary/Chest: Effort normal and breath sounds normal. No respiratory distress. She has no wheezes. She has no rales. She exhibits no tenderness.  Abdominal: Soft. Bowel sounds are normal. She exhibits no distension and no mass. There is no tenderness. There is no rebound and no guarding.  Musculoskeletal: Normal range of motion. She exhibits no edema and no tenderness.  Lymphadenopathy:    She has no cervical adenopathy.  Neurological:  She is alert and oriented to person, place, and time. She has normal reflexes. No cranial nerve deficit. She exhibits normal muscle tone. Coordination normal.  Skin: Skin is warm and dry. No rash noted. She is not diaphoretic. No erythema. No pallor.  Psychiatric: She has a normal mood and affect. Her behavior is normal. Judgment and thought content normal.          Assessment & Plan:

## 2012-03-10 NOTE — Patient Instructions (Addendum)
#asthma  - test is positive for asthma; restart qvar  #Weight management #WEight Management    - we discussed extensively about weight management   - follow low glycemic diet plan that I outlined for you after extensive discussion. Do not follow other plans  - General  - drink lot of water  - avoid all moderate and high glycemic foods especially bad fruits, breads, pastas, fried foods, battered foods, sugary foods  - make non-starchy vegetables your base in terms of volume you eat  - always make sure you balance good carbs, good protein and good fat source  - good carbs are non-starchy vegetables, uncanned beans in the left column and low glycemic fruits in the left colum  - good protein source is egg white, beans, tofu, fish, chicken breast, fish, [place] and bison. Remember meat has to be skinless  - good healthy fat source is nuts, and fish   - focusing on eating right healthy foods (left lane) and avoiding unhealthy foods (middle and right lane) is better way to lose weight than to go hypo-caloric  - focus on staying full by eating right  - having a daily and weekly plan for what you will eat and where you will eat depending on your work, social life schedule is very important   - watch out for misleading labels on grocery aisle: High Fiber and Low Fat labeled foods generally are high in bad carbs or sugar  - measure weight once  a week  - discipline and attitude is key. Do not care for anyone else's opinion or feelings. Only yours matters   - For breakfast  - most important meal of the day. So, eat daily breakfast. Do not skip   - recommend 1/2 to 1 cup steel cut oat meal or 1/2 to 1 cup fiber one 60 cal   Or  1 to 1.5 cups Kashi go-lean with non-fat plain milk or 60 Cal Silk Soy mild. Can add Berries. Can have egg at same time for breakfast  - For snacks  - recommend total 2-3 snacks per day  - snack should be light and filling  - best times are between breakfast and lunch, lunch  and dinner and sometimes post-dinner snack  - Nut are great snacks. Stick to low glycemic nuts (less than 50gm per day) and eat only the nuts in the left lane like peanuts, pista, almond, walnut  - Low glycemic fruits are great snacks. Have 1-2 servings each day of fruits from the left lane   - If you like yogurt or cottage cheese - recommend Oikos or Fage 0% greek yogourt or Plan non-fat yogurt or Breakstone non-fat cottage cheese. Theyse have the least sugar. Do no exceed 100-200 gram per day. Fruit yogurts are the worst  - For Lunch and dinner  - unlimited non-starchy vegetable (prefer raw fresh or roasted or grilled) with skinless chicken or fish  - Special Notes  - Nuts: Nut are great snacks and have heart benefits. Stick to low glycemic nuts (less than 50gm per day) and eat only the nuts in the left lane like peanuts, pista, almond, walnuts  -  Ok to eat above nuts daily but only < 50gm/day  - If you eat more than 50gm/day then you run risk of eating too many calories or saturated fat  - AVoid nuts glazed with sugar. Nuts have to be in salted/original form or roasted   - Fruits: Eat 1-2 fresh fruit servings daily but  fruits can be dangerous because of high sugar content. So, choose your fruits wisely. Eat only the low glycemic fruits (left lane). Eat them fresh.  Do not eat them canned  - Avoid all fresh juices except if you use the low glycemic fruits and make them yourself without adding extra sugar   - Dairy: Is optional. Eat zero fat or low fat, fruit free yogurts or cottage cheese but not more than 100-200g per day  - Restaurant  - all restaurants have bad and good choices. Even fast food restaurants offer you good choices  - at restaurants do no fall prey to social pressure.. One way to eat healthy at restaurant is to eat healthy snack or light healthy meal before you go to restaurant so that will prevent your cravings  - Restaurants with worst choices: [place] (except [employer], or  [employer]), [place], [place]. At these restaurants avoid the bread, curry, fried and battered foods and chips  - Restaurants with best choices: greek, [place], [place], [place] (again here avoid bread, deep fried stuffed and pasta)  - Restaurants with Ok choice: McDonald's, [employer], [employer] (again here avoid the bread, fried stuff, fried meat)  - Always ask for grilled meat or vegetables, and fresh salad choices (get your salad dressing as low fat and to the side)

## 2012-03-10 NOTE — Assessment & Plan Note (Signed)
-   metcholine challenge positive oct 2013 with normal flow volume loop. So, restart qvar

## 2012-03-10 NOTE — Assessment & Plan Note (Signed)
Advised low glycemic diet (her diet is full of moderate and high glycemic foods)  > 50% of this > 25 min visit spent in face to face counseling (15 min visit converted to 25 min)  Specifically the following discussed #WEight Management    - we discussed extensively about weight management   - follow low glycemic diet plan that I outlined for you after extensive discussion. Do not follow other plans  - General  - drink lot of water  - avoid all moderate and high glycemic foods especially bad fruits, breads, pastas, fried foods, battered foods, sugary foods  - make non-starchy vegetables your base in terms of volume you eat  - always make sure you balance good carbs, good protein and good fat source  - good carbs are non-starchy vegetables, uncanned beans in the left column and low glycemic fruits in the left colum  - good protein source is egg white, beans, tofu, fish, chicken breast, fish, [place] and bison. Remember meat has to be skinless  - good healthy fat source is nuts, and fish   - focusing on eating right healthy foods (left lane) and avoiding unhealthy foods (middle and right lane) is better way to lose weight than to go hypo-caloric  - focus on staying full by eating right  - having a daily and weekly plan for what you will eat and where you will eat depending on your work, social life schedule is very important   - watch out for misleading labels on grocery aisle: High Fiber and Low Fat labeled foods generally are high in bad carbs or sugar  - measure weight once  a week  - discipline and attitude is key. Do not care for anyone else's opinion or feelings. Only yours matters   - For breakfast  - most important meal of the day. So, eat daily breakfast. Do not skip   - recommend 1/2 to 1 cup steel cut oat meal or 1/2 to 1 cup fiber one 60 cal   Or  1 to 1.5 cups Kashi go-lean with non-fat plain milk or 60 Cal Silk Soy mild. Can add Berries. Can have egg at same time for  breakfast  - For snacks  - recommend total 2-3 snacks per day  - snack should be light and filling  - best times are between breakfast and lunch, lunch and dinner and sometimes post-dinner snack  - Nut are great snacks. Stick to low glycemic nuts (less than 50gm per day) and eat only the nuts in the left lane like peanuts, pista, almond, walnut  - Low glycemic fruits are great snacks. Have 1-2 servings each day of fruits from the left lane   - If you like yogurt or cottage cheese - recommend Oikos or Fage 0% greek yogourt or Plan non-fat yogurt or Breakstone non-fat cottage cheese. Theyse have the least sugar. Do no exceed 100-200 gram per day. Fruit yogurts are the worst  - For Lunch and dinner  - unlimited non-starchy vegetable (prefer raw fresh or roasted or grilled) with skinless chicken or fish  - Special Notes  - Nuts: Nut are great snacks and have heart benefits. Stick to low glycemic nuts (less than 50gm per day) and eat only the nuts in the left lane like peanuts, pista, almond, walnuts  -  Ok to eat above nuts daily but only < 50gm/day  - If you eat more than 50gm/day then you run risk of eating too many calories or saturated  fat  - AVoid nuts glazed with sugar. Nuts have to be in salted/original form or roasted   - Fruits: Eat 1-2 fresh fruit servings daily but fruits can be dangerous because of high sugar content. So, choose your fruits wisely. Eat only the low glycemic fruits (left lane). Eat them fresh.  Do not eat them canned  - Avoid all fresh juices except if you use the low glycemic fruits and make them yourself without adding extra sugar   - Dairy: Is optional. Eat zero fat or low fat, fruit free yogurts or cottage cheese but not more than 100-200g per day  - Restaurant  - all restaurants have bad and good choices. Even fast food restaurants offer you good choices  - at restaurants do no fall prey to social pressure.. One way to eat healthy at restaurant is to eat healthy  snack or light healthy meal before you go to restaurant so that will prevent your cravings  - Restaurants with worst choices: [place] (except [employer], or [employer]), [place], [place]. At these restaurants avoid the bread, curry, fried and battered foods and chips  - Restaurants with best choices: greek, [place], [place], [place] (again here avoid bread, deep fried stuffed and pasta)  - Restaurants with Ok choice: McDonald's, [employer], [employer] (again here avoid the bread, fried stuff, fried meat)  - Always ask for grilled meat or vegetables, and fresh salad choices (get your salad dressing as low fat and to the side)

## 2012-04-05 ENCOUNTER — Ambulatory Visit (INDEPENDENT_AMBULATORY_CARE_PROVIDER_SITE_OTHER): Payer: Managed Care, Other (non HMO) | Admitting: Family

## 2012-04-05 ENCOUNTER — Encounter: Payer: Self-pay | Admitting: Family

## 2012-04-05 VITALS — BP 120/82 | HR 91 | Temp 98.4°F | Resp 16 | Wt >= 6400 oz

## 2012-04-05 DIAGNOSIS — R0789 Other chest pain: Secondary | ICD-10-CM

## 2012-04-05 DIAGNOSIS — R350 Frequency of micturition: Secondary | ICD-10-CM

## 2012-04-05 DIAGNOSIS — R079 Chest pain, unspecified: Secondary | ICD-10-CM

## 2012-04-05 DIAGNOSIS — R3129 Other microscopic hematuria: Secondary | ICD-10-CM

## 2012-04-05 LAB — BASIC METABOLIC PANEL
BUN: 14 mg/dL (ref 6–23)
CO2: 26 mEq/L (ref 19–32)
Calcium: 10.1 mg/dL (ref 8.4–10.5)
Creat: 0.74 mg/dL (ref 0.50–1.10)
Glucose, Bld: 98 mg/dL (ref 70–99)

## 2012-04-05 LAB — POCT URINALYSIS DIPSTICK
Nitrite, UA: NEGATIVE
Urobilinogen, UA: 0.2
pH, UA: 6

## 2012-04-05 MED ORDER — CIPROFLOXACIN HCL 500 MG PO TABS: 500.0000 mg | ORAL_TABLET | Freq: Two times a day (BID) | ORAL | Status: DC

## 2012-04-05 NOTE — Progress Notes (Signed)
Subjective:    Patient ID: Amber Salas, female    DOB: 1975/06/29, 36 y.o.   MRN: [id number]  HPI  Amber Salas is a 36 yr old female who presents today with chief complaint of dysuria.  She reports that symptoms have been present x 6 days.  She denies associated fever or hematuria.  Using cranberry juice without significant improvement.  Reports associated urinary frequency.  LMP was 11/3- 11/9.  Chest discomfort- she reports that she had one episode of left sided chest discomfort which occurred at rest and radiated across her chest and down her left arm.  Episode was brief and she has had no further episodes.    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 She has stopped eating these foods and her symptoms have improved considerably.   Review of Systems See HPI  Past Medical History  Diagnosis Date  . Asthma     history of  . Murmur, cardiac   . Retinal hole or tear     left eye- 1996 due to injury  . Obstructive sleep apnea on CPAP   . Morbid obesity   . Arrhythmia   . OSA (obstructive sleep apnea) 08/30/2010  . GERD (gastroesophageal reflux disease)   . H/O hiatal hernia   . Headache     migraines    History   Social History  . Marital Status: Married    Spouse Name: N/A    Number of Children: 3  . Years of Education: N/A   Occupational History  . CUSTOMER SERVICE [employer]   Social History Main Topics  . Smoking status: Never Smoker   . Smokeless tobacco: Never Used  . Alcohol Use: No  . Drug Use: No  . Sexually Active: Yes    Birth Control/ Protection: Surgical   Other Topics Concern  . 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    Past Surgical History  Procedure Date  . Cesarean section 4/04  . Hernia repair 04/2009    abdominal hernia  . Wisdom tooth extraction 1999  . Eye surgery 11/2010    lasar  repair of retinal tear  . Tubal ligation   . Esophagogastroduodenoscopy 12/30/2011    Procedure: ESOPHAGOGASTRODUODENOSCOPY (EGD);  Surgeon: [name] Dare, MD,FACG;  Location: [facility];  Service: Endoscopy;  Laterality: N/A;    Family History  Problem Relation Age of Onset  . Hypertension Mother   . Diabetes Mother     type II  . Other Mother     cervical dysplasia  . Ovarian cancer Mother   . Hypertension Father   . Bipolar disorder Father   . Heart disease Maternal Grandmother   . Hypertension Maternal Grandmother   . Bipolar disorder Maternal Grandmother   . Hypertension Maternal Grandfather   . Hypertension Paternal Grandmother   . Hypertension Paternal Grandfather     Allergies  Allergen Reactions  . Fluticasone-Salmeterol Palpitations and Other (See Comments)    chest pain  . Latex Rash and Other (See Comments)    wheezing  . Spinach Rash    Current Outpatient Prescriptions on File Prior to Visit  Medication Sig Dispense Refill  . albuterol (PROVENTIL HFA;VENTOLIN HFA) 108 (90 BASE) MCG/ACT inhaler Inhale 2 puffs into the lungs every 4 (four) hours as needed. For shortness of breath or wheezing      .  albuterol (PROVENTIL) (2.5 MG/3ML) 0.083% nebulizer solution Take 3 mLs (2.5 mg total) by nebulization every 4 (four) hours as needed. For shortness of breath or wheezing  75 mL  5  . beclomethasone (QVAR) 40 MCG/ACT inhaler Inhale 2 puffs into the lungs 2 (two) times daily. Take 2 puffs first thing in am and then another 2 puffs about 12 hours later.  1 Inhaler  11  . benzonatate (TESSALON) 200 MG capsule Take 1 capsule by mouth Three times daily as needed.      . cetirizine (ZYRTEC) 10 MG tablet Take 10 mg by mouth daily.      [name] dextromethorphan (DELSYM) 30 MG/5ML liquid Take 60 mg by mouth as needed. 2 tsp every 12 hrs as needed      . fluticasone (FLONASE) 50 MCG/ACT nasal spray Place 1 spray into the nose daily.      [name] ibuprofen (ADVIL,MOTRIN) 200 MG tablet Take  200 mg by mouth once as needed. For pain--take as directed on bottle      . Multiple Vitamin (MULITIVITAMIN WITH MINERALS) TABS Take 1 tablet by mouth daily.      . Olopatadine HCl (PATADAY) 0.2 % SOLN Place 1 drop into both eyes as needed. For allergies      . pantoprazole (PROTONIX) 40 MG tablet Take 30- 60 min before your first meal of the day      . traMADol (ULTRAM) 50 MG tablet Take 50 mg by mouth every 4 (four) hours as needed.        BP 120/82  Pulse 91  Temp 98.4 F (36.9 C) (Oral)  Resp 16  Wt 402 lb 1.3 oz (182.382 kg)  SpO2 98%  LMP 03/20/2012       Objective:   Physical Exam  Constitutional: She is oriented to person, place, and time. She appears well-developed and well-nourished. No distress.  HENT:  Head: Normocephalic and atraumatic.  Cardiovascular: Normal rate and regular rhythm.   No murmur heard. Pulmonary/Chest: Effort normal and breath sounds normal. No respiratory distress. She has no wheezes. She has no rales. She exhibits no tenderness.  Musculoskeletal: She exhibits no edema.  Neurological: She is alert and oriented to person, place, and time.  Psychiatric: She has a normal mood and affect. Her behavior is normal. Judgment and thought content normal.          Assessment & Plan:

## 2012-04-05 NOTE — Assessment & Plan Note (Signed)
UA reviewed and notes microscopic hematuria only.  Will send for culture and plan empiric rx with cipro x 3 days. Will also obtain bmet to rule out hyperglycemia as cause for her frequency.

## 2012-04-05 NOTE — Assessment & Plan Note (Signed)
Stable and improved now that she is avoiding foods containing potatoes and peppermint.

## 2012-04-05 NOTE — Assessment & Plan Note (Addendum)
Symptoms very atypical. Obtained EKG today- EKG compared to prior ekg and is unchanged without acute changes. Pt is instructed to let me know if this occurs again and to go to the ER if severe.  If recurrent symptoms, plan referral for stress test.

## 2012-04-05 NOTE — Patient Instructions (Addendum)
Please call if you develop recurrent episodes of chest discomfort or if urinary symptoms do not resolve.  Go to the ER if chest pain is severe. Follow up in 3 months.

## 2012-04-06 ENCOUNTER — Encounter: Payer: Self-pay | Admitting: Family

## 2012-04-28 ENCOUNTER — Encounter: Payer: Self-pay | Admitting: Internal Medicine

## 2012-04-28 ENCOUNTER — Other Ambulatory Visit: Payer: Self-pay | Admitting: Family

## 2012-04-28 ENCOUNTER — Ambulatory Visit (INDEPENDENT_AMBULATORY_CARE_PROVIDER_SITE_OTHER): Payer: Managed Care, Other (non HMO) | Admitting: Internal Medicine

## 2012-04-28 DIAGNOSIS — J45909 Unspecified asthma, uncomplicated: Secondary | ICD-10-CM

## 2012-04-28 NOTE — Progress Notes (Signed)
Subjective:    Patient ID: Amber Salas, female    DOB: 10-19-1975, 36 y.o.   MRN: [id number]  HPI PCP is O'SULLIVAN,MELISSA S., NP  Body mass index is 63.43 kg/(m^2).  Never smoker.  Known OSA - on cpap; managed by PMD.    IOV 10/29/2011/ Deandre Brannan  Baseline morbidly obese female who has gained 30# in few years since stopped exercising. Interested in weight loss  Baseline OSA managed on cpap by PMD  At baseline: Hx of ashma x 6 years. Dx in [place], [place] by a primary care physician on basis of symptoms nos and relief with albuterol nebulizer prn. There is no hx of having had PFTs. STarted on symbicort 3 years ago in [place] and reports relief. Typical asthma symptoms are dyspnea, gasping for air, cough, wheezing and resulting in vomiting. Symptoms are episodic including at rest, fumes, car smoke, perfumes. Symptoms are relieved by cppap. At baselin asthma symptom severity is moderate.  There is hx of 8 pred bursts x 12 months  At baseline, does have associated SINUS DRAINAGE and OCC. GERD.   At baseline also has chronic cough that is of moderate severity. 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  -  C/o dyspnea. having paroxysmal nocturnal dyspnea x 2 - 6 months several times a week. Gets up choking. Dyspnea is associated with cough x 2-6 months. Symbicort not helping that well this year. 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 Cough differentiator suggests neurogenic/airway cough worse (score 5 of 5), than GERD (score 3 of 5).      LABS  - CXR 10/23/11 - clear    11/19/2011 Follow up  Returns for 4 week follow up . Seen last ov for pulmonary consult for asthma and chronic cough.  Underwent PFTs on 6/28>FEV1 2.70 L/87% -mid flows with 26% change after SABA , ratio 77  Decreased DLCO at 58%.  Last ov dx with cyclical cough secondary to LPR/GERD and PND  Placed on Flonase, Protonix, GERD  diet.  Restarted  on symbicort.   She returns > minimal improvement in cough. Still has coughing fits to point of vomiting.  Feels short of breat on/off.  Went to ER last week with neg cardiac enzymes, and D. Dimer.  CXR clear.  We discussed her PFT results.  She is still out of work. 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  Hx of OSA , referred to Dr. Craige Cotta later this month for sleep consult.  No exertional chest pain, edema or fever.  rec Begin Delsym 2 tsp Twice daily   Begin Tessalon 200mg  1 Three times a day   May use Hydromet 1 tsp every 4 hr .As needed  For breakthrough cough.  Add Pepcid 20mg  At bedtime   Continue on Protonix 40mg  daily before meal  Begin Chlortrimeton 4mg  2 At bedtime   Continue on Zyrtec 10mg  daily in am .  Goal is not to cough or clear throat- use sugarless candy, water. -NO MINTS  GERD diet.  Continue on Symbicort 160/4.39mcg 2 puffs Twice daily  -brush/rinse/gargle after use    12/02/2011 f/u ov/Wert not able to sustain s prednisone since June 2012 and finished last course x 2 weeks prior to OV  Cc 7/18 increase cough to point of vomiting. But even before flare still needed nebulizer around lunchtime plus saba  q3-4 h including in the car on the way over for ov. Ran out singulair about a week before onset ? Worse, not sure. Taking protonix ac and pepcid 20 mg at hs.  Cough tends to be worse p supper before lie down and much worse at hs x 3-4 days prior to OV . >>changed to QVAR , rx pred taper along with Protonix and tramadol   12/10/11 Follow up  Returns for follow up  1 week follow up - reports SOB is improved with qvar,  but dry cough is slightly worse b/c unable to get the pred, tramadol, protonix She did not have the money for the rx until now . She is planning on picking them up in 2 days  She is feeling better w/ decreased dyspnea. Cough is unchanged No fever or hemoptysis .  Had thyroid US w/ thyroid cysts noted, no dominant cysts  or thyromegaly .    12/29/2011 Follow up and med review.  We reviewed all her meds and organized them into a med calendar with pt education  She is feeling better with decreased cough.  No fever or discolored mucus  Has upcoming Endo per GI in am .  REC      May return back to work on 01/06/12 .  Continue on current regimen.  follow up Dr. Marchelle Gearing in 1 month and As needed   Follow med calendar closely and bring to each visit.    OV 02/18/2012 Presents for followup. Cough reportedly markedly better and today in office she is not coughing. However, RSI cough score is still very high at 31 and suggestiove of LPR cough. I think she is back at work. She says sihe is compliant with QVAR. Exhaled NO on QVAR - 20 and is LOW PROB for asthma v WELL CONTROLLED. Details of cough are below   Glad You are better  IN order to sort out your asthma status better, stop your QVAR for 2 weeks and have methacholine challenge test  If your breathing or cough gets worse, return sooner  Have flu shot today 02/18/2012  REturn after methacholine challenge test in in 2-3weeks   OV 03/10/2012 REviewe methacholine - strongly poisitie for asthma. Will restart qvar  Also to discuss weight mgmt. Wants lif back. Calls herself cookie lover. Eats biscuits, sausages, fries for daily food. > 80% of her food is moderate or high glycemic. She says she is willing to make the changes in her diet needed to get her health back. She wants to go back to work. She has 3 young kids  And says she needs to live for them Estimated Body mass index is 67.14 kg/(m^2) as calculated from the following:   Height as of this encounter: 5\' 6" (1.676 m).   Weight as of this encounter: 416 lb(188.696 kg).   #WEight Management  - we discussed extensively about weight management  - follow low glycemic diet plan that I outlined for you after extensive discussion. Do not follow other plans   #asthma  - test is positive for asthma; restart  qvar   OV 04/28/2012  FU weight and cough/asthma  #Weight   Estimated Body mass index is 65.03 kg/(m^2) as calculated from the following:   Height as of this encounter: 5\' 6" (1.676 m).   Weight as of this encounter: 402 lb 14.4 oz(182.754 kg).  Has lost 14# on low glycemic diet. She is doing great on diet sheet I gave her. Bad selectionsar ebrown sugar with breakfast,  some carrots, apple servings that are slightly excessive but overall a huge turn around. Apart from some hunger pangs periodically, feeling more energetic and btter. She continues to remain motivated. Says kids are enjoying the healthier diet. She could do better with her vegetable and lean meat intake  #ASthma cough  - stable with qvar. Weather changes bring on cough which is dry. Seveirty is mild-moderate currently   Dr Gretta Cool Reflux Symptom Index (> 13-15 suggestive of LPR cough) 10/29/11 02/18/12  Hoarseness of problem with voice 5 4  Clearing  Of Throat 5 4  Excess throat mucus or feeling of post nasal drip 5 4  Difficulty swallowing food, liquid or tablets 1 3  Cough after eating or lying down 2 3  Breathing difficulties or choking episodes 5 3  Troublesome or annoying cough 3 3  Sensation of something sticking in throat or lump in throat 4 5  Heartburn, chest pain, indigestion, or stomach acid coming up 3 3  TOTAL 33 31     Review of Systems  Constitutional: Negative for fever and unexpected weight change.  HENT: Negative for ear pain, nosebleeds, congestion, sore throat, rhinorrhea, sneezing, trouble swallowing, dental problem, postnasal drip and sinus pressure.   Eyes: Negative for redness and itching.  Respiratory: Negative for cough, chest tightness, shortness of breath and wheezing.   Cardiovascular: Negative for palpitations and leg swelling.  Gastrointestinal: Negative for nausea and vomiting.  Genitourinary: Negative for dysuria.  Musculoskeletal: Negative for joint swelling.  Skin: Negative for  rash.  Neurological: Negative for headaches.  Hematological: Does not bruise/bleed easily.  Psychiatric/Behavioral: Negative for dysphoric mood. The patient is not nervous/anxious.    Past, Family, Social reviewed: no change since last visit     Objective:   Physical Exam Vitals reviewed. Constitutional: She is oriented to person, place, and time. She appears well-developed and well-nourished. No distress.       Body mass index is 66.26 kg/(m^2). Body mass index is 65.03 kg/(m^2). on 04/28/2012   No cough Children at her side  HENT:  Head: Normocephalic and atraumatic.  Right Ear: External ear normal.  Left Ear: External ear normal.  Mouth/Throat: Oropharynx is clear and moist. No oropharyngeal exudate.  Eyes: Conjunctivae normal and EOM are normal. Pupils are equal, round, and reactive to light. Right eye exhibits no discharge. Left eye exhibits no discharge. No scleral icterus.  Neck: Normal range of motion. Neck supple. No JVD present. No tracheal deviation present. No thyromegaly present.  Cardiovascular: Normal rate, regular rhythm, normal heart sounds and intact distal pulses.  Exam reveals no gallop and no friction rub.   No murmur heard. Pulmonary/Chest: Effort normal and breath sounds normal. No respiratory distress. She has no wheezes. She has no rales. She exhibits no tenderness.  Abdominal: Soft. Bowel sounds are normal. She exhibits no distension and no mass. There is no tenderness. There is no rebound and no guarding.  Musculoskeletal: Normal range of motion. She exhibits no edema and no tenderness.  Lymphadenopathy:    She has no cervical adenopathy.  Neurological: She is alert and oriented to person, place, and time. She has normal reflexes. No cranial nerve deficit. She exhibits normal muscle tone. Coordination normal.  Skin: Skin is warm and dry. No rash noted. She is not diaphoretic. No erythema. No pallor.  Psychiatric: She has a normal mood and affect. Her  behavior is normal. Judgment and thought content normal.  Assessment & Plan:

## 2012-04-28 NOTE — Assessment & Plan Note (Signed)
#  asthma  - under control  - continue qvar

## 2012-04-28 NOTE — Patient Instructions (Addendum)
#  weight management  - glad you lost 14#, continue the good work  - stop oranges and carrots and brown sugar   - cut down on the good fruits - your fruit servings should be <2 medium sized apples per day - understand bad carb concept  - Total Carb  - Fiber  = BAd carbs - per main meal this cannot exceed 35-40g/day  - Sugar is a bad carb - download Myfitnesspal app or Atkins app  on your phone and set caloric goal of 0.5# weight loss per week   #asthma  - under control  - continue qvar  #Followup  - 2 months

## 2012-04-28 NOTE — Assessment & Plan Note (Signed)
#  weight management  - glad you lost 14#, continue the good work  - stop oranges and carrots and brown sugar   - cut down on the good fruits - your fruit servings should be <2 medium sized apples per day - understand bad carb concept  - Total Carb  - Fiber  = BAd carbs - per main meal this cannot exceed 35-40g/day  - Sugar is a bad carb - download Myfitnesspal app or Atkins app  on your phone and set caloric goal of 0.5# weight loss per week  #Followup  - 2 months  > 50% of this > 25 min visit spent in face to face counseling (15 min visit converted to 25 min)

## 2012-04-29 ENCOUNTER — Telehealth: Payer: Self-pay | Admitting: Internal Medicine

## 2012-04-30 MED ORDER — PANTOPRAZOLE SODIUM 40 MG PO TBEC: 40.0000 mg | DELAYED_RELEASE_TABLET | Freq: Every day | ORAL | Status: DC

## 2012-04-30 NOTE — Telephone Encounter (Signed)
Refill sent. Connie Lasater, CMA  

## 2012-05-21 ENCOUNTER — Ambulatory Visit: Payer: Managed Care, Other (non HMO) | Admitting: Family

## 2012-05-22 ENCOUNTER — Ambulatory Visit (INDEPENDENT_AMBULATORY_CARE_PROVIDER_SITE_OTHER): Payer: Managed Care, Other (non HMO) | Admitting: Family Medicine

## 2012-05-22 ENCOUNTER — Encounter: Payer: Self-pay | Admitting: Family Medicine

## 2012-05-22 VITALS — BP 128/80 | HR 82 | Temp 98.0°F | Resp 16 | Wt 393.8 lb

## 2012-05-22 DIAGNOSIS — J45909 Unspecified asthma, uncomplicated: Secondary | ICD-10-CM

## 2012-05-22 DIAGNOSIS — J069 Acute upper respiratory infection, unspecified: Secondary | ICD-10-CM | POA: Insufficient documentation

## 2012-05-22 MED ORDER — HYDROCODONE-HOMATROPINE 5-1.5 MG/5ML PO SYRP: 5.0000 mL | ORAL_SOLUTION | Freq: Three times a day (TID) | ORAL | Status: DC | PRN

## 2012-05-22 NOTE — Patient Instructions (Addendum)
Drink lots of liquids  Okay to work on Monday  Tylenol or aspirin for general symptoms  You should nebulizer with albuterol 3 times daily and continue the Qvar 2 puffs twice daily  Hydromet 1/2-1 teaspoon 3 times daily when necessary for cough  If you're asthma gets worse call your pulmonologist

## 2012-05-22 NOTE — Progress Notes (Signed)
  Subjective:    Patient ID: Amber Salas, female    DOB: 1976/04/21, 37 y.o.   MRN: [id number]  HPI Amber Salas is a 37 year old married female nonsmoker G3 P3 currently separated from her husband who comes in today with a two-day history of head congestion sore throat nonproductive cough and voice loss  One of her children had a cold over the holidays  She has a history of asthma and is followed in pulmonary. She currently uses her inhaler 2 puffs twice a day nebulizer once daily when necessary  She is a nonsmoker.    Review of Systems General and pulmonary review of systems otherwise negative    Objective:   Physical Exam Well-developed well-nourished female in no acute distress HEENT negative neck was supple no adenopathy lungs are clear       Assessment & Plan:  Viral syndrome plan treat symptomatically

## 2012-06-17 ENCOUNTER — Telehealth: Payer: Self-pay | Admitting: Internal Medicine

## 2012-06-17 DIAGNOSIS — J45909 Unspecified asthma, uncomplicated: Secondary | ICD-10-CM

## 2012-06-17 NOTE — Telephone Encounter (Signed)
ATC x 4 line busy [facility]

## 2012-06-18 NOTE — Telephone Encounter (Signed)
LMTCB x 1 for the pt  

## 2012-06-21 NOTE — Telephone Encounter (Signed)
lmomtcb x 2  

## 2012-06-22 NOTE — Telephone Encounter (Signed)
The correct number to call is [phone] [facility]

## 2012-06-22 NOTE — Telephone Encounter (Signed)
Pt returned triage's call & asked to be reached at [phone].  Pt states you should leave a detailed message on her VM if we are calling her.  Amber Salas

## 2012-06-23 NOTE — Telephone Encounter (Signed)
Amber Salas, have you seen the paperwork for accommodations on this pt? She said she faxed them over last week and needs this for work.   Pt is also requesting a smaller portable nebulizer machine  for work. DME is [employer]. Order sent for this.

## 2012-06-29 ENCOUNTER — Ambulatory Visit: Payer: Managed Care, Other (non HMO) | Admitting: Internal Medicine

## 2012-06-29 NOTE — Telephone Encounter (Signed)
I do not have these forms. I LMTCBx1 to advise the pt. Carron Curie, CMA

## 2012-06-30 ENCOUNTER — Ambulatory Visit (INDEPENDENT_AMBULATORY_CARE_PROVIDER_SITE_OTHER): Payer: Managed Care, Other (non HMO) | Admitting: Family

## 2012-06-30 ENCOUNTER — Other Ambulatory Visit (HOSPITAL_COMMUNITY)
Admission: RE | Admit: 2012-06-30 | Discharge: 2012-06-30 | Disposition: A | Discharge status: Home / Self Care | Payer: Managed Care, Other (non HMO) | Source: Ambulatory Visit | Attending: Family | Admitting: Family

## 2012-06-30 ENCOUNTER — Encounter: Payer: Self-pay | Admitting: Family

## 2012-06-30 VITALS — BP 118/98 | HR 97 | Temp 98.7°F | Resp 16

## 2012-06-30 DIAGNOSIS — N926 Irregular menstruation, unspecified: Secondary | ICD-10-CM

## 2012-06-30 DIAGNOSIS — Z01419 Encounter for gynecological examination (general) (routine) without abnormal findings: Secondary | ICD-10-CM

## 2012-06-30 NOTE — Addendum Note (Signed)
Addended by: Mervin Kung A on: 06/30/2012 04:14 PM   Modules accepted: Orders

## 2012-06-30 NOTE — Assessment & Plan Note (Signed)
Will refer to GYN for further evaluation.  

## 2012-06-30 NOTE — Patient Instructions (Addendum)
You will be contacted about your referral to GYN.  Please let us know if you have not heard back within 1 week about your referral. Please follow up in 6 months.

## 2012-06-30 NOTE — Progress Notes (Signed)
Subjective:    Patient ID: Amber Salas, female    DOB: [birth year], 37 y.o.   MRN: [id number]  HPI  Amber Salas is a 37 yr old female who presents today for her routine GYN exam.  She reports irregular menses.  She has been having twice monthly periods which tend to be heavy. She is s/p BTL.     Review of Systems See HPI Past Medical History  Diagnosis Date  . Asthma     history of  . Murmur, cardiac   . Retinal hole or tear     left eye- 1996 due to injury  . Obstructive sleep apnea on CPAP   . Morbid obesity   . Arrhythmia   . OSA (obstructive sleep apnea) 08/30/2010  . GERD (gastroesophageal reflux disease)   . H/O hiatal hernia   . Headache     migraines    History   Social History  . Marital Status: Married    Spouse Name: N/A    Number of Children: 3  . Years of Education: N/A   Occupational History  . CUSTOMER SERVICE [employer]   Social History Main Topics  . Smoking status: Never Smoker   . Smokeless tobacco: Never Used  . Alcohol Use: No  . Drug Use: No  . Sexually Active: Yes    Birth Control/ Protection: Surgical   Other Topics Concern  . 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    Past Surgical History  Procedure Laterality Date  . Cesarean section  4/04  . Hernia repair  04/2009    abdominal hernia  . Wisdom tooth extraction  1999  . Eye surgery  11/2010    lasar repair of retinal tear  . Tubal ligation    . Esophagogastroduodenoscopy  12/30/2011    Procedure: ESOPHAGOGASTRODUODENOSCOPY (EGD);  Surgeon: [name] Dare, MD,FACG;  Location: [facility];  Service: Endoscopy;  Laterality: N/A;    Family History  Problem Relation Age of Onset  . Hypertension Mother   . Diabetes Mother     type II  . Other Mother     cervical dysplasia  . Ovarian cancer Mother   . Hypertension Father   . Bipolar disorder Father    . Heart disease Maternal Grandmother   . Hypertension Maternal Grandmother   . Bipolar disorder Maternal Grandmother   . Hypertension Maternal Grandfather   . Hypertension Paternal Grandmother   . Hypertension Paternal Grandfather     Allergies  Allergen Reactions  . Peppermint Oil     asthma  . Fluticasone-Salmeterol Palpitations and Other (See Comments)    chest pain  . Latex Rash and Other (See Comments)    wheezing  . Spinach Rash    Current Outpatient Prescriptions on File Prior to Visit  Medication Sig Dispense Refill  . albuterol (PROVENTIL) (2.5 MG/3ML) 0.083% nebulizer solution Take 3 mLs (2.5 mg total) by nebulization every 4 (four) hours as needed. For shortness of breath or wheezing  75 mL  5  . beclomethasone (QVAR) 40 MCG/ACT inhaler Inhale 2 puffs into the lungs 2 (two) times daily. Take 2 puffs first thing in am and then another 2 puffs about 12 hours later.  1 Inhaler  11  . benzonatate (TESSALON) 200 MG capsule Take 1 capsule by mouth Three times daily as needed.      [name]  cetirizine (ZYRTEC) 10 MG tablet Take 10 mg by mouth daily.      . ciprofloxacin (CIPRO) 500 MG tablet Take 1 tablet (500 mg total) by mouth 2 (two) times daily.  6 tablet  0  . dextromethorphan (DELSYM) 30 MG/5ML liquid Take 60 mg by mouth as needed. 2 tsp every 12 hrs as needed      . fluticasone (FLONASE) 50 MCG/ACT nasal spray Place 1 spray into the nose daily.      [name] ibuprofen (ADVIL,MOTRIN) 200 MG tablet Take 200 mg by mouth once as needed. For pain--take as directed on bottle      . Multiple Vitamin (MULITIVITAMIN WITH MINERALS) TABS Take 1 tablet by mouth daily.      . Olopatadine HCl (PATADAY) 0.2 % SOLN Place 1 drop into both eyes as needed. For allergies      . pantoprazole (PROTONIX) 40 MG tablet Take 1 tablet (40 mg total) by mouth daily. Take 30- 60 min before your first meal of the day  30 tablet  6  . traMADol (ULTRAM) 50 MG tablet Take 50 mg by mouth every 4 (four) hours as needed.        No current facility-administered medications on file prior to visit.    BP 118/98  Pulse 97  Temp(Src) 98.7 F (37.1 C) (Oral)  Resp 16  SpO2 97%  LMP 06/27/2012       Objective:   Physical Exam  Constitutional: She appears well-developed and well-nourished. No distress.  Cardiovascular: Normal rate and regular rhythm.   No murmur heard. Pulmonary/Chest: Effort normal and breath sounds normal. No respiratory distress. She has no wheezes. She has no rales. She exhibits no tenderness.  Genitourinary:  Breasts: Examined lying and sitting.  Right: Without masses, retractions, discharge or axillary adenopathy.  Left: Without masses, retractions, discharge or axillary adenopathy.  Inguinal/mons: Normal without inguinal adenopathy  External genitalia: Normal  BUS/Urethra/Skene's glands: Normal  Bladder: Normal  Vagina: Normal  Cervix: Normal Uterus: normal in size, shape and contour. Midline and mobile  Adnexa/parametria:  Rt: Without masses or tenderness.  Lt: Without masses or tenderness.  Anus and perineum: Normal    Musculoskeletal: She exhibits no edema.          Assessment & Plan:

## 2012-06-30 NOTE — Assessment & Plan Note (Signed)
Pap smear performed today.  Difficult exam due to habitus.  W

## 2012-07-01 NOTE — Telephone Encounter (Signed)
Spoke with the pt and advised that we do not have the forms She states that she will drop them off today Will forward to JC to keep and eye out for

## 2012-07-05 NOTE — Telephone Encounter (Signed)
Forms were dropped off on Friday and sent down to [facility]. Pt is aware. Carron Curie, CMA

## 2012-07-06 ENCOUNTER — Encounter: Payer: Self-pay | Admitting: Family

## 2012-07-07 NOTE — Telephone Encounter (Signed)
Is this sorted out ?

## 2012-07-12 ENCOUNTER — Ambulatory Visit: Payer: Managed Care, Other (non HMO) | Admitting: Internal Medicine

## 2012-07-12 ENCOUNTER — Encounter: Payer: Self-pay | Admitting: Internal Medicine

## 2012-07-12 ENCOUNTER — Ambulatory Visit (INDEPENDENT_AMBULATORY_CARE_PROVIDER_SITE_OTHER): Payer: Managed Care, Other (non HMO) | Admitting: Internal Medicine

## 2012-07-12 NOTE — Patient Instructions (Addendum)
REturn in 2 months Continue asthma treatment and weight control diet

## 2012-07-12 NOTE — Progress Notes (Signed)
Subjective:    Patient ID: Amber Salas, female    DOB: 11-04-1975, 37 y.o.   MRN: [id number]  HPI    Review of Systems  Constitutional: Negative for fever and unexpected weight change.  HENT: Negative for ear pain, nosebleeds, congestion, sore throat, rhinorrhea, sneezing, trouble swallowing, dental problem, postnasal drip and sinus pressure.   Eyes: Negative for redness and itching.  Respiratory: Negative for cough, chest tightness, shortness of breath and wheezing.   Cardiovascular: Negative for palpitations and leg swelling.  Gastrointestinal: Negative for nausea and vomiting.  Genitourinary: Negative for dysuria.  Musculoskeletal: Negative for joint swelling.  Skin: Negative for rash.  Neurological: Negative for headaches.  Hematological: Does not bruise/bleed easily.  Psychiatric/Behavioral: Negative for dysphoric mood. The patient is not nervous/anxious.        Objective:   Physical Exam  Constitutional: She is oriented to person, place, and time.  Body mass index is 65.08 kg/(m^2). Discussion only visit  Neurological: She is alert and oriented to person, place, and time.  Psychiatric: She has a normal mood and affect. Her behavior is normal. Judgment and thought content normal.          Assessment & Plan:  PCP is O'SULLIVAN,MELISSA S., NP  Body mass index is 63.43 kg/(m^2).  Never smoker.  Known OSA - on cpap; managed by PMD.    IOV 10/29/2011/ Ramaswamy  Baseline morbidly obese female who has gained 30# in few years since stopped exercising. Interested in weight loss  Baseline OSA managed on cpap by PMD  At baseline: Hx of ashma x 6 years. Dx in [place], [place] by a primary care physician on basis of symptoms nos and relief with albuterol nebulizer prn. There is no hx of having had PFTs. STarted on symbicort 3 years ago in [place] and reports relief. Typical asthma symptoms are dyspnea, gasping for air, cough, wheezing and resulting in vomiting. Symptoms are  episodic including at rest, fumes, car smoke, perfumes. Symptoms are relieved by cppap. At baselin asthma symptom severity is moderate.  There is hx of 8 pred bursts x 12 months  At baseline, does have associated SINUS DRAINAGE and OCC. GERD.   At baseline also has chronic cough that is of moderate severity. 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  -  C/o dyspnea. having paroxysmal nocturnal dyspnea x 2 - 6 months several times a week. Gets up choking. Dyspnea is associated with cough x 2-6 months. Symbicort not helping that well this year. 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 Cough differentiator suggests neurogenic/airway cough worse (score 5 of 5), than GERD (score 3 of 5).      LABS  - CXR 10/23/11 - clear    11/19/2011 Follow up  Returns for 4 week follow up . Seen last ov for pulmonary consult for asthma and chronic cough.  Underwent PFTs on 6/28>FEV1 2.70 L/87% -mid flows with 26% change after SABA , ratio 77  Decreased DLCO at 58%.  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  Restarted  on symbicort.   She returns > minimal improvement in cough. Still has coughing fits to point of vomiting.  Feels short of breat on/off.  Went to ER last week with neg cardiac enzymes, and D. Dimer.  CXR clear.  We discussed her PFT  results.  She is still out of work. 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  Hx of OSA , referred to Dr. Craige Cotta later this month for sleep consult.  No exertional chest pain, edema or fever.  rec Begin Delsym 2 tsp Twice daily   Begin Tessalon 200mg  1 Three times a day   May use Hydromet 1 tsp every 4 hr .As needed  For breakthrough cough.  Add Pepcid 20mg  At bedtime   Continue on Protonix 40mg  daily before meal  Begin Chlortrimeton 4mg  2 At bedtime   Continue on Zyrtec 10mg  daily in am .   Goal is not to cough or clear throat- use sugarless candy, water. -NO MINTS  GERD diet.  Continue on Symbicort 160/4.63mcg 2 puffs Twice daily  -brush/rinse/gargle after use    12/02/2011 f/u ov/Wert not able to sustain s prednisone since June 2012 and finished last course x 2 weeks prior to OV  Cc 7/18 increase cough to point of vomiting. But even before flare still needed nebulizer around lunchtime plus saba q3-4 h including in the car on the way over for ov. Ran out singulair about a week before onset ? Worse, not sure. Taking protonix ac and pepcid 20 mg at hs.  Cough tends to be worse p supper before lie down and much worse at hs x 3-4 days prior to OV . >>changed to QVAR , rx pred taper along with Protonix and tramadol   12/10/11 Follow up  Returns for follow up  1 week follow up - reports SOB is improved with qvar,  but dry cough is slightly worse b/c unable to get the pred, tramadol, protonix She did not have the money for the rx until now . She is planning on picking them up in 2 days  She is feeling better w/ decreased dyspnea. Cough is unchanged No fever or hemoptysis .  Had thyroid US w/ thyroid cysts noted, no dominant cysts or thyromegaly .    12/29/2011 Follow up and med review.  We reviewed all her meds and organized them into a med calendar with pt education  She is feeling better with decreased cough.  No fever or discolored mucus  Has upcoming Endo per GI in am .  REC      May return back to work on 01/06/12 .  Continue on current regimen.  follow up Dr. Marchelle Gearing in 1 month and As needed   Follow med calendar closely and bring to each visit.    OV 02/18/2012 Presents for followup. Cough reportedly markedly better and today in office she is not coughing. However, RSI cough score is still very high at 31 and suggestiove of LPR cough. I think she is back at work. She says sihe is compliant with QVAR. Exhaled NO on QVAR - 20 and is LOW PROB for asthma v WELL CONTROLLED.  Details of cough are below   Glad You are better  IN order to sort out your asthma status better, stop your QVAR for 2 weeks and have methacholine challenge test  If your breathing or cough gets worse, return sooner  Have flu shot today 02/18/2012  REturn after methacholine challenge test in in 2-3weeks   OV 03/10/2012 REviewe methacholine - strongly poisitie for asthma. Will restart qvar  Also to discuss weight mgmt. Wants lif back. Calls herself cookie lover. Eats biscuits, sausages, fries for daily food. > 80% of her food is moderate or high glycemic. She  says she is willing to make the changes in her diet needed to get her health back. She wants to go back to work. She has 3 young kids  And says she needs to live for them Estimated Body mass index is 67.14 kg/(m^2) as calculated from the following:   Height as of this encounter: 5\' 6" (1.676 m).   Weight as of this encounter: 416 lb(188.696 kg).   #WEight Management  - we discussed extensively about weight management  - follow low glycemic diet plan that I outlined for you after extensive discussion. Do not follow other plans   #asthma  - test is positive for asthma; restart qvar   OV 04/28/2012  FU weight and cough/asthma  #Weight   Estimated Body mass index is 65.03 kg/(m^2) as calculated from the following:   Height as of this encounter: 5\' 6" (1.676 m).   Weight as of this encounter: 402 lb 14.4 oz(182.754 kg).  Has lost 14# on low glycemic diet. She is doing great on diet sheet I gave her. Bad selectionsar ebrown sugar with breakfast, some carrots, apple servings that are slightly excessive but overall a huge turn around. Apart from some hunger pangs periodically, feeling more energetic and btter. She continues to remain motivated. Says kids are enjoying the healthier diet. She could do better with her vegetable and lean meat intake  #ASthma cough  - stable with qvar. Weather changes bring on cough which is dry.  Seveirty is mild-moderate currently   Dr Gretta Cool Reflux Symptom Index (> 13-15 suggestive of LPR cough) 10/29/11 02/18/12  Hoarseness of problem with voice 5 4  Clearing  Of Throat 5 4  Excess throat mucus or feeling of post nasal drip 5 4  Difficulty swallowing food, liquid or tablets 1 3  Cough after eating or lying down 2 3  Breathing difficulties or choking episodes 5 3  Troublesome or annoying cough 3 3  Sensation of something sticking in throat or lump in throat 4 5  Heartburn, chest pain, indigestion, or stomach acid coming up 3 3  TOTAL 33 31    REC #weight management  - glad you lost 14#, continue the good work  - stop oranges and carrots and brown sugar   - cut down on the good fruits - your fruit servings should be <2 medium sized apples per day - understand bad carb concept  - Total Carb  - Fiber  = BAd carbs - per main meal this cannot exceed 35-40g/day  - Sugar is a bad carb - download Myfitnesspal app or Atkins app  on your phone and set caloric goal of 0.5# weight loss per week   #asthma  - under control  - continue qvar  #Followup  - 2 months   OV 07/12/2012 Followup morbid obesity and asthma and cyclical cough/irritable larynx  In terms of obesity: Uncle died 07-08-12 in [place] from MI. Aunt died 4 days later from MI too. Older couple in 53s. Then, Bertis Ruddy a close friend in her 51s died from Cancer 7 days ago. Therefore, because  Of all this traveling and spent 2 weeks in [place] and unable to be 100% Strict with diet. She could not refuse "BIG MOMMA"  Who served lot of fried food. This prevented her from losing weight but on the positive side is that she has not gained weight since last visit. Since her return from [place] she is now back on her low glycemic diet  In terms of asthma and  cough no new problems. Symptoms are quiet  Estimated body mass index is 65.08 kg/(m^2) as calculated from the following:   Height as of this encounter: 5\' 6"  (1.676  m).   Weight as of this encounter: 403 lb (182.8 kg).

## 2012-07-12 NOTE — Assessment & Plan Note (Signed)
Body mass index is 65.08 kg/(m^2). - this is unchanged from prior visit. It looks like she lost weight but in the last 2 weeks after visiting [place] and due to social stressors family deaths she went away word with her diet and gained weight. Net result is no weight gain no weight loss.  Plan  -= We discussed stressors like travel that resulted in weight gain - We discussed coping mechanisms  She is back on low glycemic diet She will see me in 2 months  (> 50% of this 15 min visit spent in face to face counseling)

## 2012-07-30 ENCOUNTER — Telehealth: Payer: Self-pay | Admitting: Internal Medicine

## 2012-07-30 NOTE — Telephone Encounter (Signed)
I spoke with Peggy. She stated she faxed this to [phone]. I advised will forward to Victorino Dike to look out for this fax. Please advise Jenn thanks

## 2012-08-03 NOTE — Telephone Encounter (Signed)
I received the forms and they need clarification to the questions in section D. I have highlighted this section and placed in your look-at. Please advise. Carron Curie, CMA

## 2012-08-04 ENCOUNTER — Encounter: Payer: Self-pay | Admitting: Family

## 2012-08-04 ENCOUNTER — Ambulatory Visit: Payer: Managed Care, Other (non HMO) | Admitting: Family

## 2012-08-04 ENCOUNTER — Ambulatory Visit (INDEPENDENT_AMBULATORY_CARE_PROVIDER_SITE_OTHER): Payer: Managed Care, Other (non HMO) | Admitting: Family

## 2012-08-04 VITALS — BP 140/90 | HR 91 | Temp 97.9°F | Resp 18 | Ht 65.98 in | Wt >= 6400 oz

## 2012-08-04 DIAGNOSIS — J45909 Unspecified asthma, uncomplicated: Secondary | ICD-10-CM

## 2012-08-04 MED ORDER — BECLOMETHASONE DIPROPIONATE 40 MCG/ACT IN AERS: 2.0000 | INHALATION_SPRAY | Freq: Two times a day (BID) | RESPIRATORY_TRACT | Status: DC

## 2012-08-04 NOTE — Assessment & Plan Note (Signed)
Recommended that she begin keeping track of calories and exercise on MyfitnessPal and limiting calories to 1200-1500 a day. Try to get 30 min a day of exercise.

## 2012-08-04 NOTE — Progress Notes (Signed)
  Subjective:    Patient ID: Amber Salas, female    DOB: 1975/10/30, 37 y.o.   MRN: [id number]  HPI  Amber Salas is a 37 yr old female who presents today for follow up  1) Asthma- reports that this is well controlled on qvar and prn albuterol. Requests refill on Qvar.  2) Morbid obesity- has been eating low glycemic diet and exercising. Keeping calories 1500 to 2000 a day. Frustrated with lack of weight loss   Review of Systems  Objective:   Physical Exam  Constitutional: She appears well-developed and well-nourished. No distress.  Cardiovascular: Normal rate and regular rhythm.   No murmur heard. Pulmonary/Chest: Effort normal and breath sounds normal. No respiratory distress. She has no wheezes. She has no rales. She exhibits no tenderness.  Psychiatric: She has a normal mood and affect. Her behavior is normal. Judgment and thought content normal.          Assessment & Plan:  15 minutes spent with pt today.  >50% of this time was spent counseling pt on diet/exercise/weight loss.

## 2012-08-04 NOTE — Assessment & Plan Note (Signed)
Stable on Qvar and prn albuterol.

## 2012-08-04 NOTE — Patient Instructions (Addendum)
Keep calories 1200-1500 a day. Use MyFitnessPal to keep track of calories. 30 minutes of exercise a day. Follow up in 3 months, sooner if problems/concerns.

## 2012-08-05 ENCOUNTER — Telehealth: Payer: Self-pay | Admitting: Internal Medicine

## 2012-08-05 NOTE — Telephone Encounter (Signed)
I gave the form to MR but he did not complete the form before he left so I looked over form and called Peggy at [employer] and gave verbal for clarification on section D. I answered that days likely to miss per month were 3 days, and partial days likely to miss were 3 days, and additional breaks was 2 days. Nothing further needed. Carron Curie, CMA

## 2012-08-06 NOTE — Telephone Encounter (Signed)
This has been taken care of. Carron Curie, CMA

## 2012-08-30 IMAGING — US US SOFT TISSUE HEAD/NECK
1 series · 14 of 25 positions shown · non-contrast
Comparison: None.

CLINICAL DATA: Difficulty swallowing, possible thyromegaly

THYROID ULTRASOUND
TECHNIQUE: Ultrasound examination of the thyroid gland and adjacent
soft tissues was performed.

[Series 1: us soft tissue head/neck · 0.10mm/px · 14 of 39 slices shown]
[im 1/39]
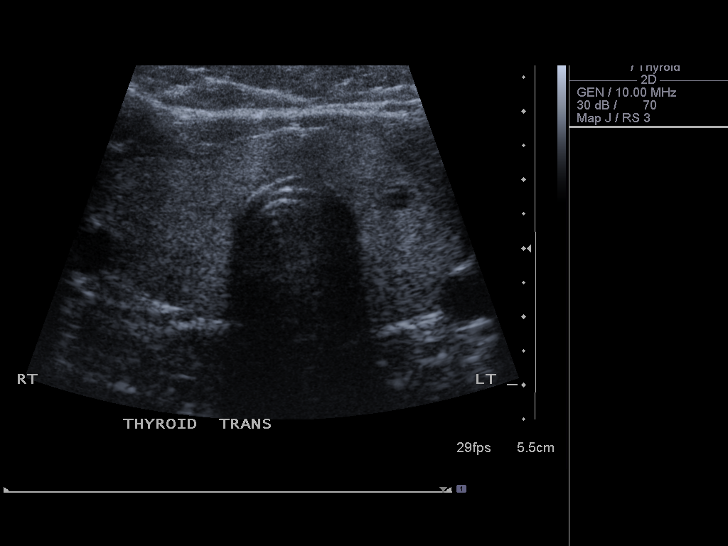
[im 4/39]
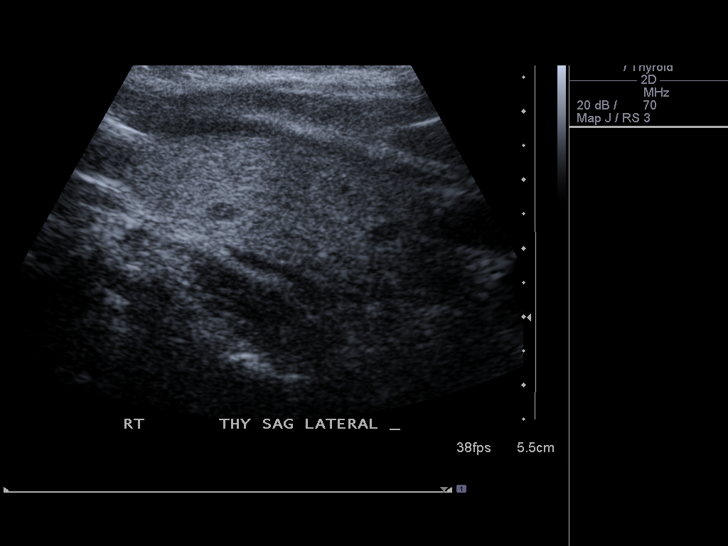
[im 7/39]
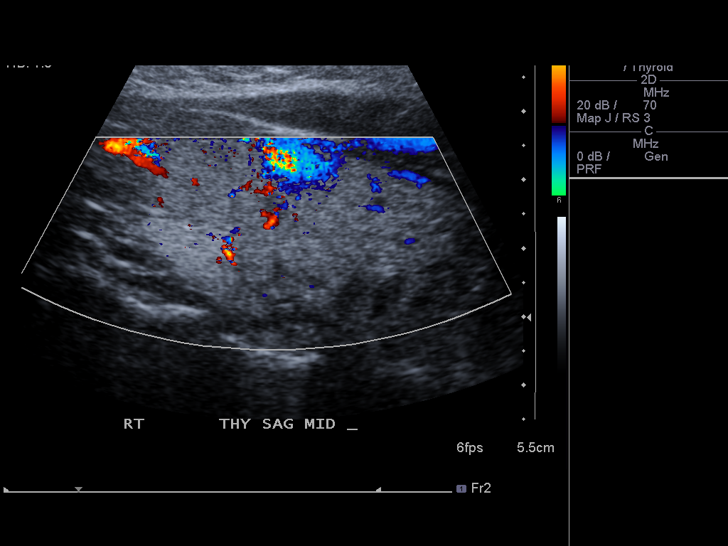
[im 10/39]
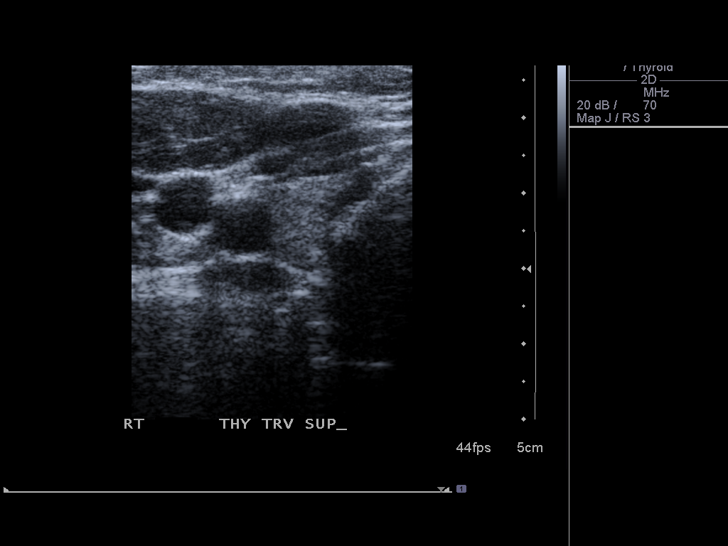
[im 13/39]
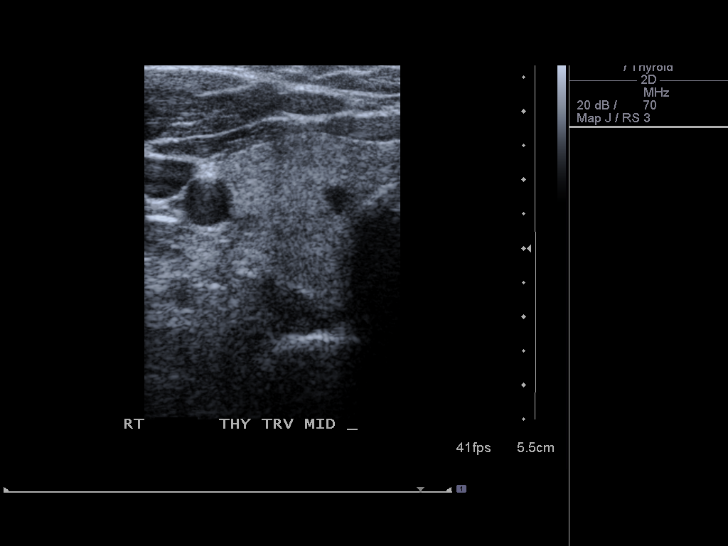
[im 15/39]
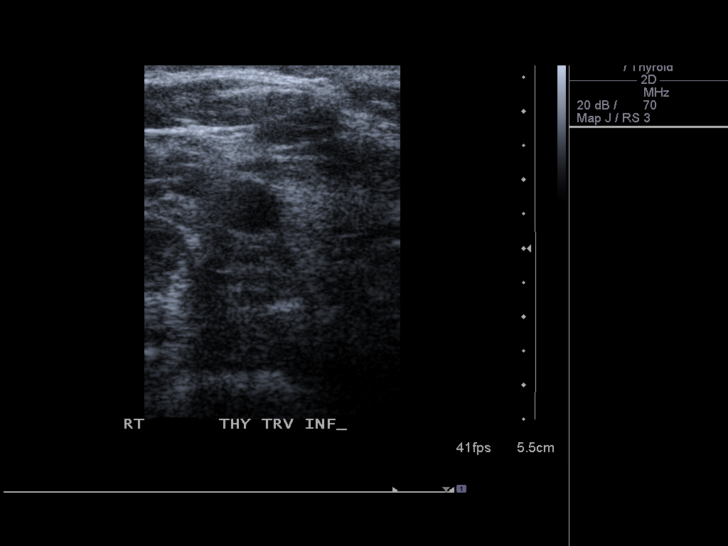
[im 18/39]
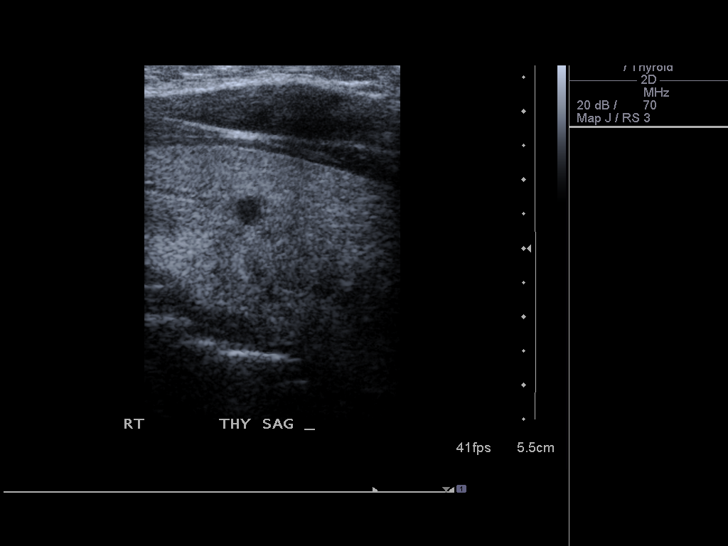
[im 21/39]
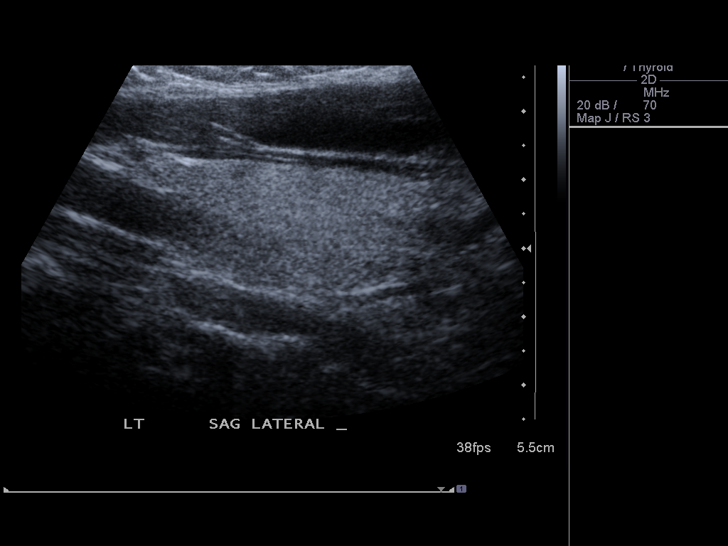
[im 24/39]
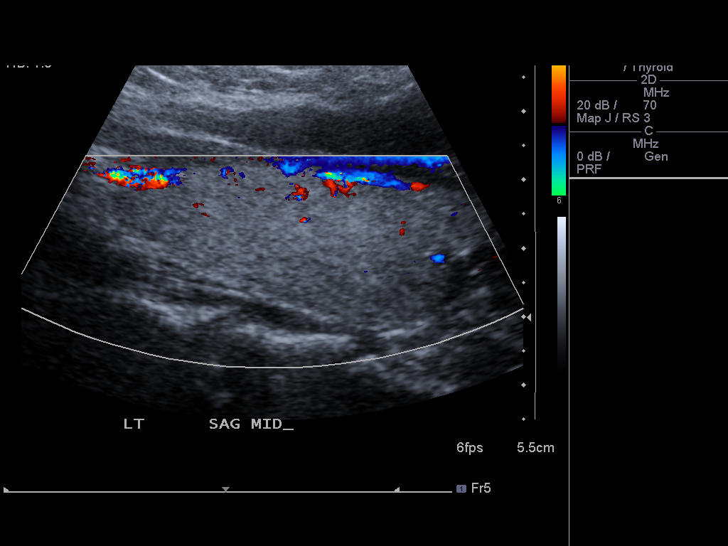
[im 26/39]
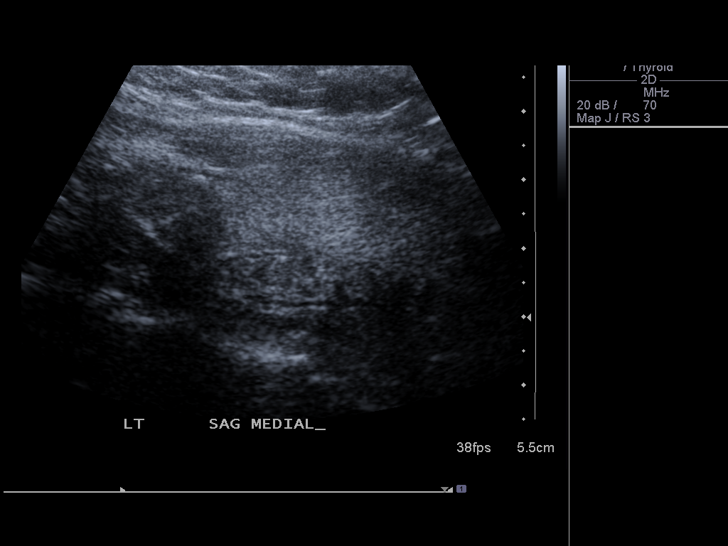
[im 29/39]
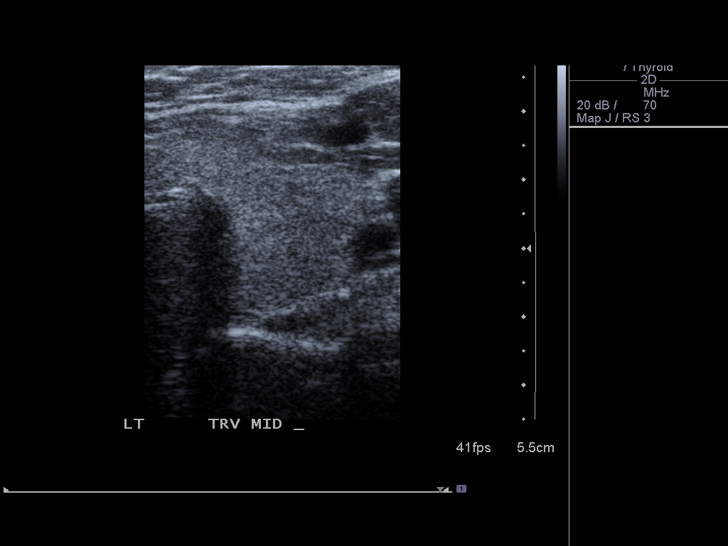
[im 32/39]
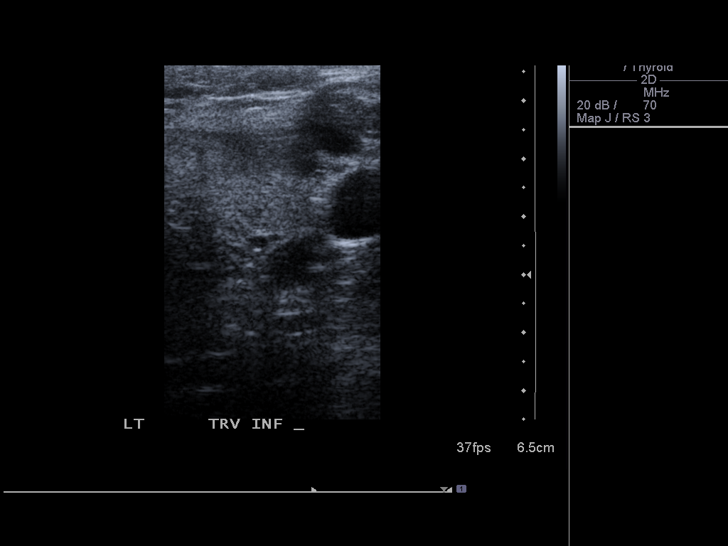
[im 35/39]
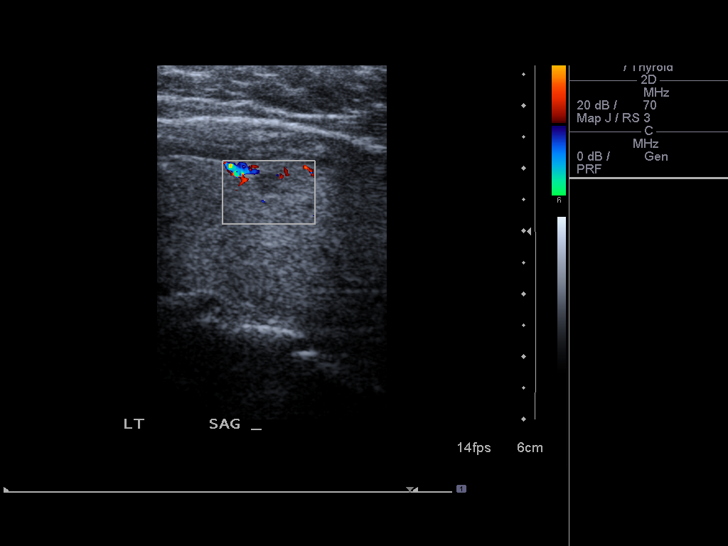
[im 39/39]
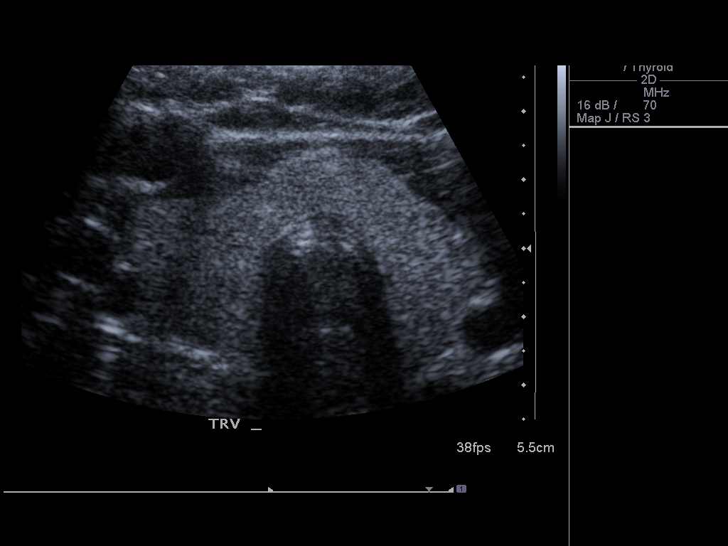

[14 of 25 positions shown; findings below may reference images not displayed]

FINDINGS: Right thyroid lobe:  5.8 x 2.4 x 2.4 cm.
Left thyroid lobe:  5.6 x 2.3 x 2.0 cm
Isthmus:  0.8 cm

Focal nodules:  Hypoechoic bilateral possible cystic nodules are
noted bilaterally measuring 5 mm and left, unlikely to be
significant.

Lymphadenopathy:  None visualized.
IMPRESSION: No dominant solid nodule or thyromegaly.

## 2012-11-01 ENCOUNTER — Ambulatory Visit: Payer: Managed Care, Other (non HMO) | Admitting: Family

## 2012-12-17 ENCOUNTER — Other Ambulatory Visit: Payer: Self-pay | Admitting: *Deleted

## 2012-12-17 MED ORDER — PANTOPRAZOLE SODIUM 40 MG PO TBEC: 40.0000 mg | DELAYED_RELEASE_TABLET | Freq: Every day | ORAL | Status: DC

## 2013-01-14 ENCOUNTER — Ambulatory Visit (INDEPENDENT_AMBULATORY_CARE_PROVIDER_SITE_OTHER): Payer: Managed Care, Other (non HMO) | Admitting: Family

## 2013-01-14 ENCOUNTER — Encounter: Payer: Self-pay | Admitting: Family

## 2013-01-14 VITALS — BP 124/86 | HR 90 | Temp 98.0°F | Resp 16 | Ht 65.98 in | Wt 394.1 lb

## 2013-01-14 DIAGNOSIS — Z23 Encounter for immunization: Secondary | ICD-10-CM

## 2013-01-14 DIAGNOSIS — G4733 Obstructive sleep apnea (adult) (pediatric): Secondary | ICD-10-CM

## 2013-01-14 DIAGNOSIS — J309 Allergic rhinitis, unspecified: Secondary | ICD-10-CM

## 2013-01-14 DIAGNOSIS — K219 Gastro-esophageal reflux disease without esophagitis: Secondary | ICD-10-CM

## 2013-01-14 MED ORDER — ALBUTEROL SULFATE HFA 108 (90 BASE) MCG/ACT IN AERS: 2.0000 | INHALATION_SPRAY | RESPIRATORY_TRACT | Status: AC | PRN

## 2013-01-14 MED ORDER — ALBUTEROL SULFATE (2.5 MG/3ML) 0.083% IN NEBU: 2.5000 mg | INHALATION_SOLUTION | RESPIRATORY_TRACT | Status: AC | PRN

## 2013-01-14 NOTE — Patient Instructions (Addendum)
Follow up in 6 months, sooner if problems or concerns.

## 2013-01-14 NOTE — Addendum Note (Signed)
Addended by: Mervin Kung A on: 01/14/2013 10:35 AM   Modules accepted: Orders

## 2013-01-14 NOTE — Assessment & Plan Note (Signed)
Stable on PPI, continue same.  

## 2013-01-14 NOTE — Progress Notes (Signed)
Subjective:    Patient ID: Amber Salas, female    DOB: [birth year], 37 y.o.   MRN: [id number]  HPI  Amber Salas is a 37 yr old female with history of asthma who presents today for follow up.    Asthma- Reports that this is well controlled. She continues qvar.  She reports that she has rarely needing to use the albuterol.    OSA- reports that she has been using cpap regularly and feeling better during the day.    GERD- reports that her reflux is well controlled on protonix.     Review of Systems See HPI  Past Medical History  Diagnosis Date  . Asthma     history of  . Murmur, cardiac   . Retinal hole or tear     left eye- 1996 due to injury  . Obstructive sleep apnea on CPAP   . Morbid obesity   . Arrhythmia   . OSA (obstructive sleep apnea) 08/30/2010  . GERD (gastroesophageal reflux disease)   . H/O hiatal hernia   . Headache(784.0)     migraines    History   Social History  . Marital Status: Married    Spouse Name: N/A    Number of Children: 3  . Years of Education: N/A   Occupational History  . CUSTOMER SERVICE [employer]   Social History Main Topics  . Smoking status: Never Smoker   . Smokeless tobacco: Never Used  . Alcohol Use: No  . Drug Use: No  . Sexual Activity: Yes    Birth Control/ Protection: Surgical   Other Topics Concern  . 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    Past Surgical History  Procedure Laterality Date  . Cesarean section  4/04  . Hernia repair  04/2009    abdominal hernia  . Wisdom tooth extraction  1999  . Eye surgery  11/2010    lasar repair of retinal tear  . Tubal ligation    . Esophagogastroduodenoscopy  12/30/2011    Procedure: ESOPHAGOGASTRODUODENOSCOPY (EGD);  Surgeon: [name] Dare, MD,FACG;  Location: [facility];  Service: Endoscopy;  Laterality: N/A;    Family History  Problem  Relation Age of Onset  . Hypertension Mother   . Diabetes Mother     type II  . Other Mother     cervical dysplasia  . Ovarian cancer Mother   . Hypertension Father   . Bipolar disorder Father   . Heart disease Maternal Grandmother   . Hypertension Maternal Grandmother   . Bipolar disorder Maternal Grandmother   . Hypertension Maternal Grandfather   . Hypertension Paternal Grandmother   . Hypertension Paternal Grandfather     Allergies  Allergen Reactions  . Peppermint Oil     asthma  . Fluticasone-Salmeterol Palpitations and Other (See Comments)    chest pain  . Latex Rash and Other (See Comments)    wheezing  . Spinach Rash    Current Outpatient Prescriptions on File Prior to Visit  Medication Sig Dispense Refill  . albuterol (PROAIR HFA) 108 (90 BASE) MCG/ACT inhaler Inhale 2 puffs into the lungs every 4 (four) hours as needed for wheezing or shortness of breath.      [name] albuterol (PROVENTIL) (2.5 MG/3ML) 0.083% nebulizer solution Take 3 mLs (2.5 mg total) by nebulization every 4 (four) hours as needed. For shortness  of breath or wheezing  75 mL  5  . benzonatate (TESSALON) 200 MG capsule Take 1 capsule by mouth Three times daily as needed.      . cetirizine (ZYRTEC) 10 MG tablet Take 10 mg by mouth daily.      [name] dextromethorphan (DELSYM) 30 MG/5ML liquid Take 60 mg by mouth as needed. 2 tsp every 12 hrs as needed      . ibuprofen (ADVIL,MOTRIN) 200 MG tablet Take 200 mg by mouth once as needed. For pain--take as directed on bottle      . medroxyPROGESTERone (PROVERA) 5 MG tablet Take 5 mg by mouth daily.      . Multiple Vitamin (MULITIVITAMIN WITH MINERALS) TABS Take 1 tablet by mouth daily.      . pantoprazole (PROTONIX) 40 MG tablet Take 1 tablet (40 mg total) by mouth daily. Take 30- 60 min before your first meal of the day  90 tablet  1   No current facility-administered medications on file prior to visit.    BP 124/86  Pulse 90  Temp(Src) 98 F (36.7 C) (Oral)   Resp 16  Ht 5' 5.98" (1.676 m)  Wt 394 lb 1.3 oz (178.754 kg)  BMI 63.64 kg/m2  SpO2 99%       Objective:   Physical Exam  Constitutional: She is oriented to person, place, and time.  Morbidly obese AA female on NAD  HENT:  Head: Normocephalic and atraumatic.  Cardiovascular: Normal rate and regular rhythm.   No murmur heard. Pulmonary/Chest: Effort normal and breath sounds normal. No respiratory distress. She has no wheezes. She has no rales. She exhibits no tenderness.  Neurological: She is alert and oriented to person, place, and time.  Psychiatric: She has a normal mood and affect. Her behavior is normal. Judgment and thought content normal.          Assessment & Plan:

## 2013-01-14 NOTE — Assessment & Plan Note (Signed)
Clinically improved on CPAP. Continue same.

## 2013-01-14 NOTE — Assessment & Plan Note (Signed)
Well controlled on qvar and albuterol. Flu shot and pneumovax today.

## 2013-01-20 ENCOUNTER — Other Ambulatory Visit: Payer: Self-pay | Admitting: Obstetrics and Gynecology

## 2013-01-20 DIAGNOSIS — N920 Excessive and frequent menstruation with regular cycle: Secondary | ICD-10-CM

## 2013-01-25 ENCOUNTER — Other Ambulatory Visit: Payer: Managed Care, Other (non HMO)

## 2013-01-26 ENCOUNTER — Ambulatory Visit
Admission: RE | Admit: 2013-01-26 | Discharge: 2013-01-26 | Disposition: A | Discharge status: Home / Self Care | Payer: Managed Care, Other (non HMO) | Source: Ambulatory Visit | Attending: Obstetrics and Gynecology | Admitting: Obstetrics and Gynecology

## 2013-01-26 ENCOUNTER — Encounter: Payer: Self-pay | Admitting: Family

## 2013-01-26 DIAGNOSIS — N920 Excessive and frequent menstruation with regular cycle: Secondary | ICD-10-CM

## 2013-01-31 ENCOUNTER — Encounter: Payer: Self-pay | Admitting: Family

## 2013-01-31 ENCOUNTER — Ambulatory Visit (INDEPENDENT_AMBULATORY_CARE_PROVIDER_SITE_OTHER): Payer: Managed Care, Other (non HMO) | Admitting: Family

## 2013-01-31 VITALS — BP 154/90 | HR 93 | Temp 97.9°F | Wt >= 6400 oz

## 2013-01-31 DIAGNOSIS — S93409A Sprain of unspecified ligament of unspecified ankle, initial encounter: Secondary | ICD-10-CM | POA: Insufficient documentation

## 2013-01-31 NOTE — Progress Notes (Signed)
Subjective:    Patient ID: Amber Salas, female    DOB: 03/16/1976, 37 y.o.   MRN: [id number]  HPI  Amber Salas is a 37 yr old female who presents today with chief complaint of foot pain.  She reports that she fell out of the drivers side of the car on Saturday and her left ankle rolled over when she tried to get out. She then developed significant pain and swelling and went to the Urgent care for evaluation.  Was given rx for ibuprofen and a walking boot. Told to return for x ray if symptoms not improved on Monday. Was told to bring copy of her CD to [place] for review by the urgent care.   Review of Systems  See HPI  Past Medical History  Diagnosis Date  . Asthma     history of  . Murmur, cardiac   . Retinal hole or tear     left eye- 1996 due to injury  . Obstructive sleep apnea on CPAP   . Morbid obesity   . Arrhythmia   . OSA (obstructive sleep apnea) 08/30/2010  . GERD (gastroesophageal reflux disease)   . H/O hiatal hernia   . Headache(784.0)     migraines    History   Social History  . Marital Status: Married    Spouse Name: N/A    Number of Children: 3  . Years of Education: N/A   Occupational History  . CUSTOMER SERVICE [employer]   Social History Main Topics  . Smoking status: Never Smoker   . Smokeless tobacco: Never Used  . Alcohol Use: No  . Drug Use: No  . Sexual Activity: Yes    Birth Control/ Protection: Surgical   Other Topics Concern  . 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    Past Surgical History  Procedure Laterality Date  . Cesarean section  4/04  . Hernia repair  04/2009    abdominal hernia  . Wisdom tooth extraction  1999  . Eye surgery  11/2010    lasar repair of retinal tear  . Tubal ligation    . Esophagogastroduodenoscopy  12/30/2011    Procedure: ESOPHAGOGASTRODUODENOSCOPY (EGD);  Surgeon: [name] Dare,  MD,FACG;  Location: [facility];  Service: Endoscopy;  Laterality: N/A;    Family History  Problem Relation Age of Onset  . Hypertension Mother   . Diabetes Mother     type II  . Other Mother     cervical dysplasia  . Ovarian cancer Mother   . Hypertension Father   . Bipolar disorder Father   . Heart disease Maternal Grandmother   . Hypertension Maternal Grandmother   . Bipolar disorder Maternal Grandmother   . Hypertension Maternal Grandfather   . Hypertension Paternal Grandmother   . Hypertension Paternal Grandfather     Allergies  Allergen Reactions  . Peppermint Oil     asthma  . Fluticasone-Salmeterol Palpitations and Other (See Comments)    chest pain  . Latex Rash and Other (See Comments)    wheezing  . Spinach Rash    Current Outpatient Prescriptions on File Prior to Visit  Medication Sig Dispense Refill  . albuterol (PROAIR HFA) 108 (90 BASE) MCG/ACT inhaler Inhale 2 puffs into the lungs every 4 (four) hours as needed for wheezing or shortness of breath.  3 Inhaler  3  .  albuterol (PROVENTIL) (2.5 MG/3ML) 0.083% nebulizer solution Take 3 mLs (2.5 mg total) by nebulization every 4 (four) hours as needed. For shortness of breath or wheezing  225 mL  3  . benzonatate (TESSALON) 200 MG capsule Take 1 capsule by mouth Three times daily as needed.      . cetirizine (ZYRTEC) 10 MG tablet Take 10 mg by mouth daily.      [name] ibuprofen (ADVIL,MOTRIN) 200 MG tablet Take 200 mg by mouth once as needed. For pain--take as directed on bottle      . medroxyPROGESTERone (PROVERA) 5 MG tablet Take 5 mg by mouth daily.      . Multiple Vitamin (MULITIVITAMIN WITH MINERALS) TABS Take 1 tablet by mouth daily.      . pantoprazole (PROTONIX) 40 MG tablet Take 1 tablet (40 mg total) by mouth daily. Take 30- 60 min before your first meal of the day  90 tablet  1   No current facility-administered medications on file prior to visit.    BP 154/90  Pulse 93  Temp(Src) 97.9 F (36.6 C)  (Oral)  Wt 402 lb (182.346 kg)  BMI 64.92 kg/m2  SpO2 97%       Objective:   Physical Exam  Constitutional: She is oriented to person, place, and time. She appears well-developed and well-nourished.  Cardiovascular: Normal rate and regular rhythm.   No murmur heard. Pulmonary/Chest: Effort normal and breath sounds normal. No respiratory distress. She has no wheezes. She has no rales. She exhibits no tenderness.  Musculoskeletal:  + soft tissue swelling of left ankle.  + tenderness to palpation laterally.   Neurological: She is alert and oriented to person, place, and time.  Psychiatric: She has a normal mood and affect. Her behavior is normal. Judgment and thought content normal.          Assessment & Plan:

## 2013-01-31 NOTE — Assessment & Plan Note (Signed)
Continued pain despite use of walking boot. I reviewed the x ray from [facility].  No visible fracture noted on my examination.  However, I have also asked her to bring this image to sports medicine for review. Recommended sparing use of ibuprofen, RICE, and referral to sports medicine.

## 2013-01-31 NOTE — Patient Instructions (Addendum)
You will be contacted about your appointment with Dr. Pearletha Forge.

## 2013-02-01 ENCOUNTER — Encounter: Payer: Self-pay | Admitting: Family Medicine

## 2013-02-01 ENCOUNTER — Ambulatory Visit (INDEPENDENT_AMBULATORY_CARE_PROVIDER_SITE_OTHER): Payer: Managed Care, Other (non HMO) | Admitting: Family Medicine

## 2013-02-01 VITALS — BP 165/95 | HR 102 | Ht 66.0 in | Wt >= 6400 oz

## 2013-02-01 DIAGNOSIS — S99912A Unspecified injury of left ankle, initial encounter: Secondary | ICD-10-CM

## 2013-02-01 DIAGNOSIS — S8990XA Unspecified injury of unspecified lower leg, initial encounter: Secondary | ICD-10-CM

## 2013-02-01 MED ORDER — OXYCODONE-ACETAMINOPHEN 5-325 MG PO TABS: 1.0000 | ORAL_TABLET | Freq: Four times a day (QID) | ORAL | Status: DC | PRN

## 2013-02-01 NOTE — Patient Instructions (Addendum)
You have a grade 3 ankle sprain. These typically take 6 weeks to improve. Ice the area for 15 minutes at a time, 3-4 times a day Aleve 2 tabs twice a day with food OR ibuprofen 3 tabs three times a day with food for pain and inflammation. Elevate above the level of your heart when possible Crutches if needed to help with walking Bear weight when tolerated Use cam walker for next 2 weeks when up and walking around. Come out of the boot/brace twice a day to do Up/down and alphabet exercises 2-3 sets of each. Consider physical therapy for strengthening and balance exercises in the future. If not improving as expected, we may repeat x-rays or consider further testing like an MRI. Follow up with me in 2 weeks.

## 2013-02-02 ENCOUNTER — Encounter: Payer: Self-pay | Admitting: Family Medicine

## 2013-02-02 DIAGNOSIS — S99912A Unspecified injury of left ankle, initial encounter: Secondary | ICD-10-CM | POA: Insufficient documentation

## 2013-02-02 NOTE — Assessment & Plan Note (Signed)
grade 3 ankle sprain. Continue with cam walker.  Come out of this twice a day to do ROM exercises.  Icing, nsaids, elevation.  F/u in 2 weeks.  Consider adding PT, ASO.  Consider further imaging if not improving but initial x-rays negative.

## 2013-02-02 NOTE — Progress Notes (Signed)
Patient ID: Amber Salas, female   DOB: Nov 22, 1975, 37 y.o.   MRN: [id number]  PCP: Lemont Fillers., NP  Subjective:   HPI: Patient is a 37 y.o. female here for left ankle injury.  Patient reports on 9/18 she got out of a car and accidentally inverted left ankle. A little soreness but not bad - resolved with ibuprofen. Then on 9/20 she severely inverted left ankle. Heard and felt two pops after this. Difficulty bearing weight also. Went to urgent care - x-rays were negative. Given boot, rx ibuprofen and oxycodone. Easier to walk with the boot.  Past Medical History  Diagnosis Date  . Asthma     history of  . Murmur, cardiac   . Retinal hole or tear     left eye- 1996 due to injury  . Obstructive sleep apnea on CPAP   . Morbid obesity   . Arrhythmia   . OSA (obstructive sleep apnea) 08/30/2010  . GERD (gastroesophageal reflux disease)   . H/O hiatal hernia   . Headache(784.0)     migraines    Current Outpatient Prescriptions on File Prior to Visit  Medication Sig Dispense Refill  . albuterol (PROAIR HFA) 108 (90 BASE) MCG/ACT inhaler Inhale 2 puffs into the lungs every 4 (four) hours as needed for wheezing or shortness of breath.  3 Inhaler  3  . albuterol (PROVENTIL) (2.5 MG/3ML) 0.083% nebulizer solution Take 3 mLs (2.5 mg total) by nebulization every 4 (four) hours as needed. For shortness of breath or wheezing  225 mL  3  . benzonatate (TESSALON) 200 MG capsule Take 1 capsule by mouth Three times daily as needed.      . cetirizine (ZYRTEC) 10 MG tablet Take 10 mg by mouth daily.      [name] ibuprofen (ADVIL,MOTRIN) 200 MG tablet Take 200 mg by mouth once as needed. For pain--take as directed on bottle      . medroxyPROGESTERone (PROVERA) 5 MG tablet Take 5 mg by mouth daily.      . Multiple Vitamin (MULITIVITAMIN WITH MINERALS) TABS Take 1 tablet by mouth daily.      . pantoprazole (PROTONIX) 40 MG tablet Take 1 tablet (40 mg total) by mouth daily. Take 30- 60 min  before your first meal of the day  90 tablet  1   No current facility-administered medications on file prior to visit.    Past Surgical History  Procedure Laterality Date  . Cesarean section  4/04  . Hernia repair  04/2009    abdominal hernia  . Wisdom tooth extraction  1999  . Eye surgery  11/2010    lasar repair of retinal tear  . Tubal ligation    . Esophagogastroduodenoscopy  12/30/2011    Procedure: ESOPHAGOGASTRODUODENOSCOPY (EGD);  Surgeon: [name] Dare, MD,FACG;  Location: [facility];  Service: Endoscopy;  Laterality: N/A;    Allergies  Allergen Reactions  . Peppermint Oil     asthma  . Fluticasone-Salmeterol Palpitations and Other (See Comments)    chest pain  . Latex Rash and Other (See Comments)    wheezing  . Spinach Rash    History   Social History  . Marital Status: Married    Spouse Name: N/A    Number of Children: 3  . Years of Education: N/A   Occupational History  . CUSTOMER SERVICE [employer]   Social History Main Topics  . Smoking status: Never Smoker   . Smokeless tobacco: Never Used  . Alcohol  Use: No  . Drug Use: No  . Sexual Activity: Yes    Birth Control/ Protection: Surgical   Other Topics Concern  . 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    Family History  Problem Relation Age of Onset  . Hypertension Mother   . Diabetes Mother     type II  . Other Mother     cervical dysplasia  . Ovarian cancer Mother   . Hypertension Father   . Bipolar disorder Father   . Heart disease Maternal Grandmother   . Hypertension Maternal Grandmother   . Bipolar disorder Maternal Grandmother   . Hypertension Maternal Grandfather   . Hypertension Paternal Grandmother   . Hypertension Paternal Grandfather   . Hypertension Sister   . Heart attack Neg Hx   . Hyperlipidemia Neg Hx   . Sudden death Neg Hx     BP 165/95   Pulse 102  Ht 5\' 6"  (1.676 m)  Wt 400 lb (181.439 kg)  BMI 64.59 kg/m2  Review of Systems: See HPI above.    Objective:  Physical Exam:  Gen: NAD  Left ankle/foot: Mod swelling lateral ankle, dorsal foot.  Minimal bruising.  No other deformity. Very limited ROM all directions. TTP greatest ATFL but also throughout proximal dorsal foot, lateral malleolus.  No fibular head, other TTP. Painful 2+ ant drawer and talar tilt. Painful syndesmotic compression. Thompsons test negative. NV intact distally.    Assessment & Plan:  1. Left ankle injury - grade 3 ankle sprain. Continue with cam walker.  Come out of this twice a day to do ROM exercises.  Icing, nsaids, elevation.  F/u in 2 weeks.  Consider adding PT, ASO.  Consider further imaging if not improving but initial x-rays negative.

## 2013-02-03 ENCOUNTER — Telehealth: Payer: Self-pay | Admitting: *Deleted

## 2013-02-03 NOTE — Telephone Encounter (Signed)
Received message from pt stating tomorrow is the deadline for submitting her FMLA paperwork and wants to know the status.  Please advise.

## 2013-02-04 NOTE — Telephone Encounter (Signed)
FMLA papers faxed to [phone].

## 2013-02-04 NOTE — Telephone Encounter (Signed)
Received call from pt. She wants [place] to include her absences pertaining to her fibroids as well on her FMLA. States she has missed 4-5 days a month due to those. Has appt with ?gyn, Dr Richardson Dopp today to discuss surgical options.  Pt would like [place] to call her once form has been completed.  Please advise.

## 2013-02-04 NOTE — Telephone Encounter (Signed)
Spoke with pt. Advised her that I would defer FMLA leave for her fibroids to Dr. Richardson Dopp.  Told her that I will fill out for asthma. She requests that we fax FMLA today.  Nicki [place],  Could you please fax form to [place]?

## 2013-02-07 ENCOUNTER — Other Ambulatory Visit: Payer: Self-pay | Admitting: *Deleted

## 2013-02-07 NOTE — Telephone Encounter (Signed)
Received fax request for QVAR for 90 day supply. Please advise.

## 2013-02-08 NOTE — Telephone Encounter (Signed)
Ok see below 

## 2013-02-09 ENCOUNTER — Other Ambulatory Visit: Payer: Self-pay | Admitting: *Deleted

## 2013-02-09 MED ORDER — BECLOMETHASONE DIPROPIONATE 40 MCG/ACT IN AERS: 2.0000 | INHALATION_SPRAY | Freq: Two times a day (BID) | RESPIRATORY_TRACT | Status: DC

## 2013-02-09 NOTE — Telephone Encounter (Signed)
Medication shown D/C by provider at 09.05.14 OV; verbal Ok by provider to add back to medication list & send 90-day to pharmacy/SLS

## 2013-02-14 ENCOUNTER — Telehealth: Payer: Self-pay | Admitting: *Deleted

## 2013-02-14 ENCOUNTER — Encounter: Payer: Self-pay | Admitting: Family

## 2013-02-14 NOTE — Telephone Encounter (Signed)
Received call from pt questioning days allowed per episode on recent FMLA. Notes that episode duration was decreased from 4 days per month from the previous year to 2 days per month this year. Advised pt that her asthma appeared uncontrolled last year until she established with a pulmonologist and her progress seemed to have improved since that time. Provider felt 2 days per month was sufficient based on recent assessment of patient. Pt wanted to know if she should contact her pulmonologist and I advised her to discuss recommendations with them.

## 2013-02-14 NOTE — Telephone Encounter (Signed)
I agree

## 2013-02-15 ENCOUNTER — Ambulatory Visit: Payer: Managed Care, Other (non HMO) | Admitting: Family Medicine

## 2013-02-21 ENCOUNTER — Encounter: Payer: Self-pay | Admitting: Family Medicine

## 2013-02-21 ENCOUNTER — Ambulatory Visit (INDEPENDENT_AMBULATORY_CARE_PROVIDER_SITE_OTHER): Payer: Managed Care, Other (non HMO) | Admitting: Family Medicine

## 2013-02-21 VITALS — BP 158/94 | HR 88 | Ht 66.0 in | Wt 390.0 lb

## 2013-02-21 DIAGNOSIS — Z5189 Encounter for other specified aftercare: Secondary | ICD-10-CM

## 2013-02-21 DIAGNOSIS — S93409A Sprain of unspecified ligament of unspecified ankle, initial encounter: Secondary | ICD-10-CM

## 2013-02-21 DIAGNOSIS — S99912D Unspecified injury of left ankle, subsequent encounter: Secondary | ICD-10-CM

## 2013-02-21 NOTE — Patient Instructions (Signed)
You have a grade 3 ankle sprain. These typically take 6 weeks to improve. Ice the area for 15 minutes at a time, 3-4 times a day Aleve 2 tabs twice a day with food OR ibuprofen 3 tabs three times a day with food for pain and inflammation. Elevate above the level of your heart when possible Bear weight when tolerated Switch to laceup brace as much as possible instead of the boot.  Ok to use the boot if really sore though. Come out of the boot/brace twice a day to do Up/down and alphabet exercises 2-3 sets of each. Start physical therapy for strengthening and balance exercises.. Follow up with me in 1 month.

## 2013-02-22 ENCOUNTER — Encounter: Payer: Self-pay | Admitting: Family Medicine

## 2013-02-22 NOTE — Progress Notes (Signed)
Patient ID: Amber Salas, female   DOB: April 27, 1976, 37 y.o.   MRN: [id number]  PCP: Lemont Fillers., NP  Subjective:   HPI: Patient is a 37 y.o. female here for left ankle injury.  9/23: Patient reports on 9/18 she got out of a car and accidentally inverted left ankle. A little soreness but not bad - resolved with ibuprofen. Then on 9/20 she severely inverted left ankle. Heard and felt two pops after this. Difficulty bearing weight also. Went to urgent care - x-rays were negative. Given boot, rx ibuprofen and oxycodone. Easier to walk with the boot.  10/13: Patient reports her left ankle feels better though still sore. Using walking boot - tried without this but too much pain, ankle feels weak. + swelling. Has been icing, taking ibuprofen during day, norco at night. Doing HEP.  Past Medical History  Diagnosis Date  . Asthma     history of  . Murmur, cardiac   . Retinal hole or tear     left eye- 1996 due to injury  . Obstructive sleep apnea on CPAP   . Morbid obesity   . Arrhythmia   . OSA (obstructive sleep apnea) 08/30/2010  . GERD (gastroesophageal reflux disease)   . H/O hiatal hernia   . Headache(784.0)     migraines    Current Outpatient Prescriptions on File Prior to Visit  Medication Sig Dispense Refill  . albuterol (PROAIR HFA) 108 (90 BASE) MCG/ACT inhaler Inhale 2 puffs into the lungs every 4 (four) hours as needed for wheezing or shortness of breath.  3 Inhaler  3  . albuterol (PROVENTIL) (2.5 MG/3ML) 0.083% nebulizer solution Take 3 mLs (2.5 mg total) by nebulization every 4 (four) hours as needed. For shortness of breath or wheezing  225 mL  3  . beclomethasone (QVAR) 40 MCG/ACT inhaler Inhale 2 puffs into the lungs 2 (two) times daily. Take 2 puffs first thing in the Am and then another 2 puffs about 12 hours later.  3 Inhaler  3  . benzonatate (TESSALON) 200 MG capsule Take 1 capsule by mouth Three times daily as needed.      . cetirizine  (ZYRTEC) 10 MG tablet Take 10 mg by mouth daily.      [name] ibuprofen (ADVIL,MOTRIN) 200 MG tablet Take 200 mg by mouth once as needed. For pain--take as directed on bottle      . medroxyPROGESTERone (PROVERA) 5 MG tablet Take 5 mg by mouth daily.      . Multiple Vitamin (MULITIVITAMIN WITH MINERALS) TABS Take 1 tablet by mouth daily.      [name] oxyCODONE-acetaminophen (PERCOCET/ROXICET) 5-325 MG per tablet Take 1 tablet by mouth every 6 (six) hours as needed for pain.  60 tablet  0  . pantoprazole (PROTONIX) 40 MG tablet Take 1 tablet (40 mg total) by mouth daily. Take 30- 60 min before your first meal of the day  90 tablet  1  . Vitamin D, Ergocalciferol, (DRISDOL) 50000 UNITS CAPS capsule        No current facility-administered medications on file prior to visit.    Past Surgical History  Procedure Laterality Date  . Cesarean section  4/04  . Hernia repair  04/2009    abdominal hernia  . Wisdom tooth extraction  1999  . Eye surgery  11/2010    lasar repair of retinal tear  . Tubal ligation    . Esophagogastroduodenoscopy  12/30/2011    Procedure: ESOPHAGOGASTRODUODENOSCOPY (EGD);  Surgeon: [name] Dare,  MD,FACG;  Location: [facility];  Service: Endoscopy;  Laterality: N/A;    Allergies  Allergen Reactions  . Peppermint Oil     asthma  . Fluticasone-Salmeterol Palpitations and Other (See Comments)    chest pain  . Latex Rash and Other (See Comments)    wheezing  . Spinach Rash    History   Social History  . Marital Status: Married    Spouse Name: N/A    Number of Children: 3  . Years of Education: N/A   Occupational History  . CUSTOMER SERVICE [employer]   Social History Main Topics  . Smoking status: Never Smoker   . Smokeless tobacco: Never Used  . Alcohol Use: No  . Drug Use: No  . Sexual Activity: Yes    Birth Control/ Protection: Surgical   Other Topics Concern  . 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    Family History  Problem Relation Age of Onset  . Hypertension Mother   . Diabetes Mother     type II  . Other Mother     cervical dysplasia  . Ovarian cancer Mother   . Hypertension Father   . Bipolar disorder Father   . Heart disease Maternal Grandmother   . Hypertension Maternal Grandmother   . Bipolar disorder Maternal Grandmother   . Hypertension Maternal Grandfather   . Hypertension Paternal Grandmother   . Hypertension Paternal Grandfather   . Hypertension Sister   . Heart attack Neg Hx   . Hyperlipidemia Neg Hx   . Sudden death Neg Hx     BP 158/94  Pulse 88  Ht 5\' 6"  (1.676 m)  Wt 390 lb (176.903 kg)  BMI 62.98 kg/m2  Review of Systems: See HPI above.    Objective:  Physical Exam:  Gen: NAD  Left ankle/foot: Mod swelling lateral ankle, dorsal foot.  Minimal bruising.  No other deformity. Mild limitation ROM all directions, improved. TTP greatest ATFL but also throughout proximal dorsal foot, lateral malleolus.  No fibular head, other TTP. Painful 1+ ant drawer and talar tilt. Painful syndesmotic compression. Thompsons test negative. NV intact distally.    Assessment & Plan:  1. Left ankle injury - grade 3 ankle sprain. Switch from cam walker to ASO.  Continue ROM exercises but start PT, strengthening.  Icing, nsaids, elevation.  F/u in 4 weeks.

## 2013-02-22 NOTE — Assessment & Plan Note (Signed)
grade 3 ankle sprain. Switch from cam walker to ASO.  Continue ROM exercises but start PT, strengthening.  Icing, nsaids, elevation.  F/u in 4 weeks.

## 2013-03-07 ENCOUNTER — Ambulatory Visit: Payer: Managed Care, Other (non HMO) | Attending: Family Medicine | Admitting: Rehabilitation

## 2013-03-07 DIAGNOSIS — E668 Other obesity: Secondary | ICD-10-CM | POA: Insufficient documentation

## 2013-03-07 DIAGNOSIS — M25579 Pain in unspecified ankle and joints of unspecified foot: Secondary | ICD-10-CM | POA: Insufficient documentation

## 2013-03-07 DIAGNOSIS — E669 Obesity, unspecified: Secondary | ICD-10-CM | POA: Insufficient documentation

## 2013-03-07 DIAGNOSIS — M25673 Stiffness of unspecified ankle, not elsewhere classified: Secondary | ICD-10-CM | POA: Insufficient documentation

## 2013-03-07 DIAGNOSIS — R262 Difficulty in walking, not elsewhere classified: Secondary | ICD-10-CM | POA: Insufficient documentation

## 2013-03-07 DIAGNOSIS — G4733 Obstructive sleep apnea (adult) (pediatric): Secondary | ICD-10-CM | POA: Insufficient documentation

## 2013-03-07 DIAGNOSIS — R609 Edema, unspecified: Secondary | ICD-10-CM | POA: Insufficient documentation

## 2013-03-07 DIAGNOSIS — IMO0001 Reserved for inherently not codable concepts without codable children: Secondary | ICD-10-CM | POA: Insufficient documentation

## 2013-03-07 DIAGNOSIS — M25676 Stiffness of unspecified foot, not elsewhere classified: Secondary | ICD-10-CM | POA: Insufficient documentation

## 2013-03-08 ENCOUNTER — Ambulatory Visit: Payer: Managed Care, Other (non HMO) | Admitting: Family Medicine

## 2013-03-08 ENCOUNTER — Encounter: Payer: Self-pay | Admitting: Nurse Practitioner

## 2013-03-08 ENCOUNTER — Ambulatory Visit (INDEPENDENT_AMBULATORY_CARE_PROVIDER_SITE_OTHER): Payer: Managed Care, Other (non HMO) | Admitting: Nurse Practitioner

## 2013-03-08 VITALS — BP 112/82 | HR 85 | Temp 98.0°F | Resp 12

## 2013-03-08 DIAGNOSIS — R011 Cardiac murmur, unspecified: Secondary | ICD-10-CM | POA: Insufficient documentation

## 2013-03-08 DIAGNOSIS — J069 Acute upper respiratory infection, unspecified: Secondary | ICD-10-CM

## 2013-03-08 MED ORDER — AZITHROMYCIN 250 MG PO TABS: ORAL_TABLET | ORAL | Status: DC

## 2013-03-08 NOTE — Progress Notes (Signed)
  Subjective:    Patient ID: Amber Salas, female    DOB: [birth year], 37 y.o.   MRN: [id number]  URI  This is a new problem. The current episode started in the past 7 days (cough 2 da, nasal congestion & sore throat today). The problem has been gradually worsening. There has been no fever. Associated symptoms include chest pain (chest tightness), congestion, coughing, headaches (mild) and a sore throat. Pertinent negatives include no abdominal pain, diarrhea, ear pain (ear fullness), nausea, sinus pain, vomiting or wheezing.      Review of Systems  Constitutional: Negative for fever, chills, activity change, appetite change and fatigue.  HENT: Positive for congestion, sore throat and voice change (hoarse). Negative for ear pain (ear fullness).   Respiratory: Positive for cough. Negative for wheezing.   Cardiovascular: Positive for chest pain (chest tightness). Negative for palpitations.  Gastrointestinal: Negative for nausea, vomiting, abdominal pain and diarrhea.  Allergic/Immunologic: Positive for environmental allergies.  Neurological: Positive for headaches (mild).  Hematological: Negative for adenopathy.       Objective:   Physical Exam  Vitals reviewed. Constitutional: She is oriented to person, place, and time. She appears well-developed and well-nourished. No distress.  HENT:  Head: Normocephalic and atraumatic.  Right Ear: External ear normal.  Left Ear: External ear normal.  Mouth/Throat: Oropharynx is clear and moist. No oropharyngeal exudate.  Sinus mucosa swollen, clear discharge  Eyes: Conjunctivae are normal. Pupils are equal, round, and reactive to light. Right eye exhibits no discharge. Left eye exhibits no discharge.  Neck: Normal range of motion. Neck supple. No tracheal deviation present. No thyromegaly present.  Cardiovascular: Normal rate and regular rhythm.   Murmur heard. Pulmonary/Chest: Effort normal and breath sounds normal. No respiratory distress.  She has no wheezes.  Musculoskeletal:  Normal gait   Lymphadenopathy:    She has no cervical adenopathy.  Neurological: She is alert and oriented to person, place, and time.  Skin: Skin is warm and dry.  Psychiatric: She has a normal mood and affect. Her behavior is normal. Thought content normal.          Assessment & Plan:  1. Acute upper respiratory infections of unspecified site Nasal d/c, no fever, chest tightness, used rescue inhaler once yesterday, no wheezing. Scheduled for gynecological surgery next week. - azithromycin (ZITHROMAX) 250 MG tablet; Take 2 tabs po on day 1, then 1 T po on days 2-5.  Dispense: 6 tablet; Refill: 0 Pt instructed not to fill unless feeling worse/fever. See instructions.

## 2013-03-08 NOTE — Patient Instructions (Addendum)
You have a viral illness. Antibiotics will not make you better. However, sometimes bacterial infections can follow viral illness. Because you are scheduled for surgery next week, I will prescribe an antibiotic to be filled only if you develop fever or fatigue gets worse. Please do not fill before 3 days. Meanwhile, use Lloyd Huger med sinus rinse to clear nasal mucous & decrease post-nasal drip. Cold meds will increase your blood pressure, so avoid these. Use hot tea & honey to soothe throat. Feel better!  Upper Respiratory Infection, Adult An upper respiratory infection (URI) is also known as the common cold. It is often caused by a type of germ (virus). Colds are easily spread (contagious). You can pass it to others by kissing, coughing, sneezing, or drinking out of the same glass. Usually, you get better in 1 or 2 weeks.  HOME CARE   Only take medicine as told by your doctor.  Use a warm mist humidifier or breathe in steam from a hot shower.  Drink enough water and fluids to keep your pee (urine) clear or pale yellow.  Get plenty of rest.  Return to work when your temperature is back to normal or as told by your doctor. You may use a face mask and wash your hands to stop your cold from spreading. GET HELP RIGHT AWAY IF:   After the first few days, you feel you are getting worse.  You have questions about your medicine.  You have chills, shortness of breath, or brown or red spit (mucus).  You have yellow or brown snot (nasal discharge) or pain in the face, especially when you bend forward.  You have a fever, puffy (swollen) neck, pain when you swallow, or white spots in the back of your throat.  You have a bad headache, ear pain, sinus pain, or chest pain.  You have a high-pitched whistling sound when you breathe in and out (wheezing).  You have a lasting cough or cough up blood.  You have sore muscles or a stiff neck. MAKE SURE YOU:   Understand these instructions.  Will watch your  condition.  Will get help right away if you are not doing well or get worse. Document Released: 10/15/2007 Document Revised: 07/21/2011 Document Reviewed: 09/02/2010 [facility] Patient Information 2014 [place], [place].

## 2013-03-09 ENCOUNTER — Ambulatory Visit: Payer: Managed Care, Other (non HMO) | Admitting: Rehabilitation

## 2013-03-14 ENCOUNTER — Ambulatory Visit: Payer: Managed Care, Other (non HMO) | Admitting: Rehabilitation

## 2013-03-16 ENCOUNTER — Ambulatory Visit: Payer: Managed Care, Other (non HMO) | Admitting: Rehabilitation

## 2013-03-17 HISTORY — PX: LAPAROSCOPIC TOTAL HYSTERECTOMY: SUR800

## 2013-03-28 ENCOUNTER — Ambulatory Visit: Payer: Managed Care, Other (non HMO) | Admitting: Family Medicine

## 2013-07-04 ENCOUNTER — Encounter: Payer: Self-pay | Admitting: Family

## 2013-07-04 ENCOUNTER — Ambulatory Visit (INDEPENDENT_AMBULATORY_CARE_PROVIDER_SITE_OTHER): Payer: Managed Care, Other (non HMO) | Admitting: Family

## 2013-07-04 VITALS — BP 182/107 | HR 96 | Temp 98.0°F | Resp 18 | Ht 66.0 in | Wt >= 6400 oz

## 2013-07-04 DIAGNOSIS — I10 Essential (primary) hypertension: Secondary | ICD-10-CM

## 2013-07-04 DIAGNOSIS — Z0000 Encounter for general adult medical examination without abnormal findings: Secondary | ICD-10-CM

## 2013-07-04 MED ORDER — OLMESARTAN MEDOXOMIL-HCTZ 20-12.5 MG PO TABS: 1.0000 | ORAL_TABLET | Freq: Every day | ORAL | Status: DC

## 2013-07-04 NOTE — Assessment & Plan Note (Signed)
Obtain fasting lab work.  Discussed BSE.  Pap next year.

## 2013-07-04 NOTE — Patient Instructions (Addendum)
Please return fasting for lab work tomorrow AM. Start Benicar HCT.  Follow up on Friday. Work hard on [employer] diet, exercise and weight loss.

## 2013-07-04 NOTE — Progress Notes (Signed)
Subjective:    Patient ID: Amber Salas, female    DOB: 04-09-1976, 38 y.o.   MRN: [id number]  HPI Amber Salas is a 38 year old female who presents today for a complete fasting physical.  Immunizations: tetanus up to date Diet: reports improvement in diet Exercise: joining the gym Pap Smear: 2/14- normal.  Wt Readings from Last 3 Encounters:  07/04/13 416 lb (188.696 kg)  02/21/13 390 lb (176.903 kg)  02/01/13 400 lb (181.439 kg)   HTN- reports + family history.  BP today is very elevated. She has gained 26 pounds since the fall.  BP Readings from Last 3 Encounters:  07/04/13 182/107  03/08/13 112/82  02/21/13 158/94     Review of Systems  Constitutional: Positive for unexpected weight change.  HENT: Negative for hearing loss.        Mild cold symptoms  Eyes: Negative for visual disturbance.  Respiratory:       Improvement in asthma  Cardiovascular:       Mild leg swelling bilaterally  Gastrointestinal: Negative for nausea, vomiting, diarrhea and blood in stool.  Genitourinary: Negative for dysuria and frequency.  Musculoskeletal: Negative for arthralgias and joint swelling.  Skin: Negative for rash.  Neurological: Negative for headaches.  Psychiatric/Behavioral:       Denies depression/anxiety   Past Medical History  Diagnosis Date  . Asthma     history of  . Murmur, cardiac   . Retinal hole or tear     left eye- 1996 due to injury  . Obstructive sleep apnea on CPAP   . Morbid obesity   . Arrhythmia   . OSA (obstructive sleep apnea) 08/30/2010  . GERD (gastroesophageal reflux disease)   . H/O hiatal hernia   . Headache(784.0)     migraines    History   Social History  . Marital Status: Legally Separated    Spouse Name: N/A    Number of Children: 3  . Years of Education: N/A   Occupational History  . CUSTOMER SERVICE [employer]   Social History Main Topics  . Smoking status: Never Smoker   . Smokeless tobacco: Never Used  .  Alcohol Use: No  . Drug Use: No  . Sexual Activity: Yes    Birth Control/ Protection: Surgical   Other Topics Concern  . Not on file   Social History Narrative   Regular exercise:  Yes   Works in Clinical biochemist at [employer] [place]   Nearing end of a divorce- feels relieved.    Past Surgical History  Procedure Laterality Date  . Cesarean section  4/04  . Hernia repair  04/2009    abdominal hernia  . Wisdom tooth extraction  1999  . Eye surgery  11/2010    lasar repair of retinal tear  . Tubal ligation    . Esophagogastroduodenoscopy  12/30/2011    Procedure: ESOPHAGOGASTRODUODENOSCOPY (EGD);  Surgeon: [name] Dare, MD,FACG;  Location: [facility];  Service: Endoscopy;  Laterality: N/A;  . Laparoscopic total hysterectomy  03/17/13    Family History  Problem Relation Age of Onset  . Hypertension Mother   . Diabetes Mother     type II  . Other Mother     cervical dysplasia  . Ovarian cancer Mother   . Hypertension Father   . Bipolar disorder Father   . Heart disease Maternal Grandmother   . Hypertension Maternal Grandmother   . Bipolar disorder Maternal Grandmother   . Hypertension Maternal  Grandfather   . Hypertension Paternal Grandmother   . Hypertension Paternal Grandfather   . Hypertension Sister   . Heart attack Neg Hx   . Hyperlipidemia Neg Hx   . Sudden death Neg Hx     Allergies  Allergen Reactions  . Peppermint Oil     asthma  . Fluticasone-Salmeterol Palpitations and Other (See Comments)    chest pain  . Latex Rash and Other (See Comments)    wheezing  . Spinach Rash    Current Outpatient Prescriptions on File Prior to Visit  Medication Sig Dispense Refill  . albuterol (PROAIR HFA) 108 (90 BASE) MCG/ACT inhaler Inhale 2 puffs into the lungs every 4 (four) hours as needed for wheezing or shortness of breath.  3 Inhaler  3  . albuterol (PROVENTIL) (2.5 MG/3ML) 0.083% nebulizer solution Take 3 mLs (2.5 mg total) by nebulization every 4 (four) hours  as needed. For shortness of breath or wheezing  225 mL  3  . beclomethasone (QVAR) 40 MCG/ACT inhaler Inhale 2 puffs into the lungs 2 (two) times daily. Take 2 puffs first thing in the Am and then another 2 puffs about 12 hours later.  3 Inhaler  3  . cetirizine (ZYRTEC) 10 MG tablet Take 10 mg by mouth daily.      [name]. ibuprofen (ADVIL,MOTRIN) 200 MG tablet Take 200 mg by mouth once as needed. For pain--take as directed on bottle      . Multiple Vitamin (MULITIVITAMIN WITH MINERALS) TABS Take 1 tablet by mouth daily.      . pantoprazole (PROTONIX) 40 MG tablet Take 1 tablet (40 mg total) by mouth daily. Take 30- 60 min before your first meal of the day  90 tablet  1   No current facility-administered medications on file prior to visit.    BP 182/107  Pulse 96  Temp(Src) 98 F (36.7 C) (Oral)  Resp 18  Ht 5\' 6"  (1.676 m)  Wt 416 lb (188.696 kg)  BMI 67.18 kg/m2  SpO2 98%  LMP 06/27/2012       Objective:   Physical Exam  Constitutional: She is oriented to person, place, and time. She appears well-developed and well-nourished. No distress.  HENT:  Head: Normocephalic and atraumatic.  Cardiovascular: Normal rate and regular rhythm.   No murmur heard. Pulmonary/Chest: Effort normal and breath sounds normal. No respiratory distress. She has no wheezes. She has no rales. She exhibits no tenderness.  Musculoskeletal: She exhibits no edema.  Neurological: She is alert and oriented to person, place, and time.  Psychiatric: She has a normal mood and affect. Her behavior is normal. Judgment and thought content normal.  Breasts: Examined lying and sitting.  Right: Without masses, retractions, discharge or axillary adenopathy.  Left: Without masses, retractions, discharge or axillary adenopathy.      Assessment & Plan:

## 2013-07-04 NOTE — Progress Notes (Signed)
Pre visit review using our clinic review tool, if applicable. No additional management support is needed unless otherwise documented below in the visit note. 

## 2013-07-04 NOTE — Assessment & Plan Note (Signed)
Body mass index is 67.18 kg/(m^2).  Discussed importance of diet/exercise/weight loss.  If no significant weight loss in the next 6 months, then will plan referral to bariatric clinic. She verbalizes understanding.

## 2013-07-04 NOTE — Assessment & Plan Note (Signed)
New.  Will start Benicar HCT.  Plan follow up on Friday.

## 2013-07-05 ENCOUNTER — Telehealth: Payer: Self-pay | Admitting: Family

## 2013-07-05 NOTE — Telephone Encounter (Signed)
Relevant patient education assigned to patient using Emmi. ° °

## 2013-07-06 ENCOUNTER — Ambulatory Visit (INDEPENDENT_AMBULATORY_CARE_PROVIDER_SITE_OTHER): Payer: Managed Care, Other (non HMO) | Admitting: Family

## 2013-07-06 VITALS — BP 167/109 | HR 98 | Temp 97.5°F | Resp 24 | Wt >= 6400 oz

## 2013-07-06 DIAGNOSIS — I10 Essential (primary) hypertension: Secondary | ICD-10-CM

## 2013-07-06 LAB — BASIC METABOLIC PANEL WITH GFR
BUN: 14 mg/dL (ref 6–23)
CALCIUM: 9.9 mg/dL (ref 8.4–10.5)
CO2: 29 meq/L (ref 19–32)
CREATININE: 0.78 mg/dL (ref 0.50–1.10)
Chloride: 98 mEq/L (ref 96–112)
GFR, Est African American: >89 mL/min
GFR, Est Non African American: >89 mL/min
GLUCOSE: 93 mg/dL (ref 70–99)
Potassium: 4.8 mEq/L (ref 3.5–5.3)
SODIUM: 139 meq/L (ref 135–145)

## 2013-07-06 LAB — TSH: TSH: 1.551 u[IU]/mL (ref 0.350–4.500)

## 2013-07-06 LAB — LIPID PANEL
CHOLESTEROL: 118 mg/dL (ref 0–200)
HDL: 51 mg/dL (ref >39)
LDL Cholesterol: 50 mg/dL (ref 0–99)
Total CHOL/HDL Ratio: 2.3 Ratio
Triglycerides: 87 mg/dL (ref <150)
VLDL: 17 mg/dL (ref 0–40)

## 2013-07-06 LAB — CBC WITH DIFFERENTIAL/PLATELET
Basophils Absolute: 0 10*3/uL (ref 0.0–0.1)
Basophils Relative: 0 % (ref 0–1)
Eosinophils Absolute: 0.3 10*3/uL (ref 0.0–0.7)
Eosinophils Relative: 3 % (ref 0–5)
HCT: 36.5 % (ref 36.0–46.0)
HEMOGLOBIN: 12 g/dL (ref 12.0–15.0)
LYMPHS ABS: 2.7 10*3/uL (ref 0.7–4.0)
LYMPHS PCT: 25 % (ref 12–46)
MCH: 26.3 pg (ref 26.0–34.0)
MCHC: 32.9 g/dL (ref 30.0–36.0)
MCV: 79.9 fL (ref 78.0–100.0)
Monocytes Absolute: 0.9 10*3/uL (ref 0.1–1.0)
Monocytes Relative: 8 % (ref 3–12)
NEUTROS PCT: 64 % (ref 43–77)
Neutro Abs: 6.8 10*3/uL (ref 1.7–7.7)
PLATELETS: 428 10*3/uL — AB (ref 150–400)
RBC: 4.57 MIL/uL (ref 3.87–5.11)
RDW: 16.7 % — ABNORMAL HIGH (ref 11.5–15.5)
WBC: 10.7 10*3/uL — AB (ref 4.0–10.5)

## 2013-07-06 LAB — HEPATIC FUNCTION PANEL
ALT: 19 U/L (ref 0–35)
AST: 18 U/L (ref 0–37)
Albumin: 4.1 g/dL (ref 3.5–5.2)
Alkaline Phosphatase: 79 U/L (ref 39–117)
BILIRUBIN DIRECT: 0.1 mg/dL (ref 0.0–0.3)
Indirect Bilirubin: 0.3 mg/dL (ref 0.2–1.2)
TOTAL PROTEIN: 7.8 g/dL (ref 6.0–8.3)
Total Bilirubin: 0.4 mg/dL (ref 0.2–1.2)

## 2013-07-06 MED ORDER — AMLODIPINE BESYLATE 5 MG PO TABS: 5.0000 mg | ORAL_TABLET | Freq: Every day | ORAL | Status: DC

## 2013-07-06 NOTE — Assessment & Plan Note (Signed)
BP Readings from Last 3 Encounters:  07/06/13 167/109  07/04/13 182/107  03/08/13 112/82   BP remains elevated, despite benicar hct.  Will add amlodipine 5mg  once daily. Follow up as scheduled on Friday.

## 2013-07-06 NOTE — Progress Notes (Signed)
   Subjective:    Patient ID: Amber Salas, female    DOB: 08/22/1975, 38 y.o.   MRN: [id number]  HPI  [name] presents today to the lab and is requesting BP recheck.     Review of Systems     Objective:   Physical Exam        Assessment & Plan:

## 2013-07-06 NOTE — Patient Instructions (Signed)
Continue benicar HCT, add amlodipine 5mg  once daily- start today. Follow up on Friday as scheduled.

## 2013-07-07 ENCOUNTER — Encounter: Payer: Self-pay | Admitting: Family

## 2013-07-07 LAB — URINALYSIS, ROUTINE W REFLEX MICROSCOPIC
Bilirubin Urine: NEGATIVE
Glucose, UA: NEGATIVE mg/dL
Hgb urine dipstick: NEGATIVE
Ketones, ur: NEGATIVE mg/dL
LEUKOCYTES UA: NEGATIVE
NITRITE: NEGATIVE
PH: 6 (ref 5.0–8.0)
Protein, ur: NEGATIVE mg/dL
SPECIFIC GRAVITY, URINE: 1.021 (ref 1.005–1.030)
Urobilinogen, UA: 0.2 mg/dL (ref 0.0–1.0)

## 2013-07-08 ENCOUNTER — Ambulatory Visit (INDEPENDENT_AMBULATORY_CARE_PROVIDER_SITE_OTHER): Payer: Managed Care, Other (non HMO) | Admitting: Family

## 2013-07-08 ENCOUNTER — Telehealth: Payer: Self-pay | Admitting: *Deleted

## 2013-07-08 ENCOUNTER — Ambulatory Visit: Payer: Managed Care, Other (non HMO) | Admitting: Family

## 2013-07-08 ENCOUNTER — Telehealth: Payer: Self-pay | Admitting: Family

## 2013-07-08 ENCOUNTER — Encounter: Payer: Self-pay | Admitting: Family

## 2013-07-08 VITALS — BP 148/90 | HR 100 | Temp 98.0°F | Resp 16 | Ht 66.0 in | Wt >= 6400 oz

## 2013-07-08 DIAGNOSIS — I10 Essential (primary) hypertension: Secondary | ICD-10-CM

## 2013-07-08 MED ORDER — OLMESARTAN MEDOXOMIL-HCTZ 40-25 MG PO TABS: 1.0000 | ORAL_TABLET | Freq: Every day | ORAL | Status: DC

## 2013-07-08 MED ORDER — OLMESARTAN MEDOXOMIL-HCTZ 20-12.5 MG PO TABS: 1.0000 | ORAL_TABLET | Freq: Every day | ORAL | Status: DC

## 2013-07-08 NOTE — Telephone Encounter (Signed)
Spoke with pt. She states the form has to be submitted by tomorrow. Advised her that we were needing to see her in the office for a follow up BP reading.  Pt was originally scheduled to see us at 9:45 but cancelled due to the weather. Pt states she felt horrible after starting amlodipine and had to lie down for 4 hours. Has increased her fluid intake. Wants to know if she can just come in today for nurse visit BP check?  Please advise.

## 2013-07-08 NOTE — Telephone Encounter (Signed)
Spoke with pt. She states she has an appt at 1 but can be here by 2pm today. Appt scheduled.

## 2013-07-08 NOTE — Progress Notes (Signed)
Subjective:    Patient ID: Amber Salas, female    DOB: 08/22/1975, 38 y.o.   MRN: [id number]  HPI  [name] is a 38 yr old female who presents today for follow up of her hypertension. She is tolerating benicar HCT without difficulty. Stared amlodipine 2 days ag and reports that she felt light headed after taking amlodipine,  Hands were pink x 4 hrs.  Denies assocciated tongue/lip swelling.   Review of Systems    see HPI  Past Medical History  Diagnosis Date  . Asthma     history of  . Murmur, cardiac   . Retinal hole or tear     left eye- 1996 due to injury  . Obstructive sleep apnea on CPAP   . Morbid obesity   . Arrhythmia   . OSA (obstructive sleep apnea) 08/30/2010  . GERD (gastroesophageal reflux disease)   . H/O hiatal hernia   . Headache(784.0)     migraines    History   Social History  . Marital Status: Legally Separated    Spouse Name: N/A    Number of Children: 3  . Years of Education: N/A   Occupational History  . CUSTOMER SERVICE [employer]   Social History Main Topics  . Smoking status: Never Smoker   . Smokeless tobacco: Never Used  . Alcohol Use: No  . Drug Use: No  . Sexual Activity: Yes    Birth Control/ Protection: Surgical   Other Topics Concern  . Not on file   Social History Narrative   Regular exercise:  Yes   Works in Clinical biochemistcustomer service at [employer]   Nearing end of a divorce- feels relieved.    Past Surgical History  Procedure Laterality Date  . Cesarean section  4/04  . Hernia repair  04/2009    abdominal hernia  . Wisdom tooth extraction  1999  . Eye surgery  11/2010    lasar repair of retinal tear  . Tubal ligation    . Esophagogastroduodenoscopy  12/30/2011    Procedure: ESOPHAGOGASTRODUODENOSCOPY (EGD);  Surgeon: [name] DareMalcolm T Stark, MD,FACG;  Location: [facility];  Service: Endoscopy;  Laterality: N/A;  . Laparoscopic total hysterectomy  03/17/13    Family History  Problem Relation Age of Onset  .  Hypertension Mother   . Diabetes Mother     type II  . Other Mother     cervical dysplasia  . Ovarian cancer Mother   . Hypertension Father   . Bipolar disorder Father   . Heart disease Maternal Grandmother   . Hypertension Maternal Grandmother   . Bipolar disorder Maternal Grandmother   . Hypertension Maternal Grandfather   . Hypertension Paternal Grandmother   . Hypertension Paternal Grandfather   . Hypertension Sister   . Heart attack Neg Hx   . Hyperlipidemia Neg Hx   . Sudden death Neg Hx     Allergies  Allergen Reactions  . Amlodipine     Hands turn red  . Peppermint Oil     asthma  . Fluticasone-Salmeterol Palpitations and Other (See Comments)    chest pain  . Latex Rash and Other (See Comments)    wheezing  . Spinach Rash    Current Outpatient Prescriptions on File Prior to Visit  Medication Sig Dispense Refill  . albuterol (PROAIR HFA) 108 (90 BASE) MCG/ACT inhaler Inhale 2 puffs into the lungs every 4 (four) hours as needed for wheezing or shortness of breath.  3 Inhaler  3  . albuterol (PROVENTIL) (2.5 MG/3ML) 0.083% nebulizer solution Take 3 mLs (2.5 mg total) by nebulization every 4 (four) hours as needed. For shortness of breath or wheezing  225 mL  3  . beclomethasone (QVAR) 40 MCG/ACT inhaler Inhale 2 puffs into the lungs 2 (two) times daily. Take 2 puffs first thing in the Am and then another 2 puffs about 12 hours later.  3 Inhaler  3  . cetirizine (ZYRTEC) 10 MG tablet Take 10 mg by mouth daily.      [name] ibuprofen (ADVIL,MOTRIN) 200 MG tablet Take 200 mg by mouth once as needed. For pain--take as directed on bottle      . Multiple Vitamin (MULITIVITAMIN WITH MINERALS) TABS Take 1 tablet by mouth daily.      . pantoprazole (PROTONIX) 40 MG tablet Take 1 tablet (40 mg total) by mouth daily. Take 30- 60 min before your first meal of the day  90 tablet  1   No current facility-administered medications on file prior to visit.    BP 148/90  Pulse 100   Temp(Src) 98 F (36.7 C) (Oral)  Resp 16  Ht 5\' 6"  (1.676 m)  Wt 411 lb (186.428 kg)  BMI 66.37 kg/m2  SpO2 98%  LMP 06/27/2012    Objective:   Physical Exam  Constitutional: She appears well-developed and well-nourished. No distress.  Psychiatric: She has a normal mood and affect. Her behavior is normal. Judgment and thought content normal.          Assessment & Plan:

## 2013-07-08 NOTE — Telephone Encounter (Signed)
Insurance Paperwork due today, did have appt @ 9:45 today(cancel due to office delay). Can her paperwork be done today?

## 2013-07-08 NOTE — Telephone Encounter (Signed)
Needs OV please.

## 2013-07-08 NOTE — Progress Notes (Signed)
Pre visit review using our clinic review tool, if applicable. No additional management support is needed unless otherwise documented below in the visit note. 

## 2013-07-08 NOTE — Telephone Encounter (Signed)
Per verbal from Provider, cancelled benicar HCT 20/12.5mg  at [facility] and proceed with 40/25mg .

## 2013-07-08 NOTE — Telephone Encounter (Signed)
Relevant patient education assigned to patient using Emmi. ° °

## 2013-07-08 NOTE — Assessment & Plan Note (Signed)
BP is improving but not at goal.  Not tolerating amlodipine. Advised pt to stop amlodipine. Will instead increase benicar hct from 20/12.5 to 40/25.  Follow up in 2 weeks for nurse visit and bmet (dx htn).

## 2013-07-08 NOTE — Telephone Encounter (Signed)
Completed wellness form and [facility] at [phone].

## 2013-07-08 NOTE — Patient Instructions (Signed)
Stop amlodipine. Benicar is being increased to 40/25mg .   Please follow up in 2 weeks for nurse visit BP check and lab work.

## 2013-07-12 ENCOUNTER — Emergency Department (HOSPITAL_BASED_OUTPATIENT_CLINIC_OR_DEPARTMENT_OTHER): Payer: Managed Care, Other (non HMO)

## 2013-07-12 ENCOUNTER — Emergency Department (HOSPITAL_BASED_OUTPATIENT_CLINIC_OR_DEPARTMENT_OTHER)
Admission: EM | Admit: 2013-07-12 | Discharge: 2013-07-12 | Disposition: A | Discharge status: Home / Self Care | Payer: Managed Care, Other (non HMO) | Attending: Emergency Medicine | Admitting: Emergency Medicine

## 2013-07-12 ENCOUNTER — Encounter (HOSPITAL_BASED_OUTPATIENT_CLINIC_OR_DEPARTMENT_OTHER): Payer: Self-pay | Admitting: Emergency Medicine

## 2013-07-12 DIAGNOSIS — Z9981 Dependence on supplemental oxygen: Secondary | ICD-10-CM | POA: Insufficient documentation

## 2013-07-12 DIAGNOSIS — G43909 Migraine, unspecified, not intractable, without status migrainosus: Secondary | ICD-10-CM | POA: Insufficient documentation

## 2013-07-12 DIAGNOSIS — I10 Essential (primary) hypertension: Secondary | ICD-10-CM | POA: Insufficient documentation

## 2013-07-12 DIAGNOSIS — Z91040 Latex allergy status: Secondary | ICD-10-CM | POA: Insufficient documentation

## 2013-07-12 DIAGNOSIS — Z87828 Personal history of other (healed) physical injury and trauma: Secondary | ICD-10-CM | POA: Insufficient documentation

## 2013-07-12 DIAGNOSIS — G4733 Obstructive sleep apnea (adult) (pediatric): Secondary | ICD-10-CM | POA: Insufficient documentation

## 2013-07-12 DIAGNOSIS — J45901 Unspecified asthma with (acute) exacerbation: Secondary | ICD-10-CM | POA: Insufficient documentation

## 2013-07-12 DIAGNOSIS — R0789 Other chest pain: Secondary | ICD-10-CM | POA: Insufficient documentation

## 2013-07-12 DIAGNOSIS — R011 Cardiac murmur, unspecified: Secondary | ICD-10-CM | POA: Insufficient documentation

## 2013-07-12 DIAGNOSIS — IMO0002 Reserved for concepts with insufficient information to code with codable children: Secondary | ICD-10-CM | POA: Insufficient documentation

## 2013-07-12 DIAGNOSIS — Z79899 Other long term (current) drug therapy: Secondary | ICD-10-CM | POA: Insufficient documentation

## 2013-07-12 DIAGNOSIS — K219 Gastro-esophageal reflux disease without esophagitis: Secondary | ICD-10-CM | POA: Insufficient documentation

## 2013-07-12 HISTORY — DX: Essential (primary) hypertension: I10

## 2013-07-12 LAB — BASIC METABOLIC PANEL
BUN: 17 mg/dL (ref 6–23)
CHLORIDE: 100 meq/L (ref 96–112)
CO2: 27 mEq/L (ref 19–32)
Calcium: 9.9 mg/dL (ref 8.4–10.5)
Creatinine, Ser: 0.8 mg/dL (ref 0.50–1.10)
GFR calc non Af Amer: >90 mL/min (ref >90)
GLUCOSE: 125 mg/dL — AB (ref 70–99)
POTASSIUM: 4.1 meq/L (ref 3.7–5.3)
Sodium: 139 mEq/L (ref 137–147)

## 2013-07-12 LAB — CBC WITH DIFFERENTIAL/PLATELET
BASOS PCT: 0 % (ref 0–1)
Basophils Absolute: 0 10*3/uL (ref 0.0–0.1)
Eosinophils Absolute: 0.2 10*3/uL (ref 0.0–0.7)
Eosinophils Relative: 2 % (ref 0–5)
HCT: 37 % (ref 36.0–46.0)
HEMOGLOBIN: 11.7 g/dL — AB (ref 12.0–15.0)
LYMPHS ABS: 2.1 10*3/uL (ref 0.7–4.0)
Lymphocytes Relative: 21 % (ref 12–46)
MCH: 26 pg (ref 26.0–34.0)
MCHC: 31.6 g/dL (ref 30.0–36.0)
MCV: 82.2 fL (ref 78.0–100.0)
MONOS PCT: 7 % (ref 3–12)
Monocytes Absolute: 0.7 10*3/uL (ref 0.1–1.0)
NEUTROS ABS: 6.8 10*3/uL (ref 1.7–7.7)
NEUTROS PCT: 70 % (ref 43–77)
Platelets: 371 10*3/uL (ref 150–400)
RBC: 4.5 MIL/uL (ref 3.87–5.11)
RDW: 16.8 % — ABNORMAL HIGH (ref 11.5–15.5)
WBC: 9.8 10*3/uL (ref 4.0–10.5)

## 2013-07-12 LAB — TROPONIN I: Troponin I: <0.3 ng/mL (ref <0.30)

## 2013-07-12 LAB — D-DIMER, QUANTITATIVE: D-Dimer, Quant: 0.68 ug/mL-FEU — ABNORMAL HIGH (ref 0.00–0.48)

## 2013-07-12 MED ORDER — IOHEXOL 350 MG/ML SOLN
100.0000 mL | Freq: Once | INTRAVENOUS | Status: AC | PRN
  Administered 2013-07-12: 100 mL via INTRAVENOUS

## 2013-07-12 NOTE — ED Notes (Signed)
Pt states at 0830 this am on way to work states she began feeling short of breath and having some tightness across chest

## 2013-07-12 NOTE — ED Provider Notes (Signed)
CSN: [id number]     Arrival date & time 07/12/13  0911 History   First MD Initiated Contact with Patient 07/12/13 [phone]     Chief Complaint  Patient presents with  . Shortness of Breath  . Chest Pain     (Consider location/radiation/quality/duration/timing/severity/associated sxs/prior Treatment) Patient is a 38 y.o. female presenting with shortness of breath and chest pain. The history is provided by the patient.  Shortness of Breath Associated symptoms: chest pain   Associated symptoms: no abdominal pain, no cough, no diaphoresis and no vomiting   Risk factors: obesity   Risk factors: no recent alcohol use, no family hx of DVT, no hx of cancer, no hx of PE/DVT, no oral contraceptive use, no prolonged immobilization, no recent surgery and no tobacco use   Chest Pain Pain location:  L chest Pain quality: aching and sharp   Pain radiates to:  L shoulder Pain radiates to the back: no   Pain severity:  Moderate Onset quality:  Gradual Duration:  1 hour Timing:  Constant Progression:  Partially resolved Chronicity:  New Context comment:  Started after taking her medicine before eating breakfast Relieved by:  None tried Worsened by:  Deep breathing Ineffective treatments:  None tried Associated symptoms: shortness of breath   Associated symptoms: no abdominal pain, no cough, no diaphoresis, no nausea, not vomiting and no weakness   Associated symptoms comment:  No wheezing Risk factors: hypertension   Risk factors: no coronary artery disease and no diabetes mellitus     Past Medical History  Diagnosis Date  . Asthma     history of  . Murmur, cardiac   . Retinal hole or tear     left eye- 1996 due to injury  . Obstructive sleep apnea on CPAP   . Morbid obesity   . Arrhythmia   . OSA (obstructive sleep apnea) 08/30/2010  . GERD (gastroesophageal reflux disease)   . H/O hiatal hernia   . Headache(784.0)     migraines  . Hypertension    Past Surgical History  Procedure  Laterality Date  . Cesarean section  4/04  . Hernia repair  04/2009    abdominal hernia  . Wisdom tooth extraction  1999  . Eye surgery  11/2010    lasar repair of retinal tear  . Tubal ligation    . Esophagogastroduodenoscopy  12/30/2011    Procedure: ESOPHAGOGASTRODUODENOSCOPY (EGD);  Surgeon: [name] Dare, MD,FACG;  Location: [facility];  Service: Endoscopy;  Laterality: N/A;  . Laparoscopic total hysterectomy  03/17/13   Family History  Problem Relation Age of Onset  . Hypertension Mother   . Diabetes Mother     type II  . Other Mother     cervical dysplasia  . Ovarian cancer Mother   . Hypertension Father   . Bipolar disorder Father   . Heart disease Maternal Grandmother   . Hypertension Maternal Grandmother   . Bipolar disorder Maternal Grandmother   . Hypertension Maternal Grandfather   . Hypertension Paternal Grandmother   . Hypertension Paternal Grandfather   . Hypertension Sister   . Heart attack Neg Hx   . Hyperlipidemia Neg Hx   . Sudden death Neg Hx    History  Substance Use Topics  . Smoking status: Never Smoker   . Smokeless tobacco: Never Used  . Alcohol Use: No   OB History   Grav Para Term Preterm Abortions TAB SAB Ect Mult Living  Review of Systems  Constitutional: Negative for diaphoresis.  Respiratory: Positive for shortness of breath. Negative for cough.   Cardiovascular: Positive for chest pain.  Gastrointestinal: Negative for nausea, vomiting and abdominal pain.  Neurological: Negative for weakness.  All other systems reviewed and are negative.      Allergies  Amlodipine; Peppermint oil; Fluticasone-salmeterol; Latex; and Spinach  Home Medications   Current Outpatient Rx  Name  Route  Sig  Dispense  Refill  . albuterol (PROAIR HFA) 108 (90 BASE) MCG/ACT inhaler   Inhalation   Inhale 2 puffs into the lungs every 4 (four) hours as needed for wheezing or shortness of breath.   3 Inhaler   3   . albuterol  (PROVENTIL) (2.5 MG/3ML) 0.083% nebulizer solution   Nebulization   Take 3 mLs (2.5 mg total) by nebulization every 4 (four) hours as needed. For shortness of breath or wheezing   225 mL   3   . beclomethasone (QVAR) 40 MCG/ACT inhaler   Inhalation   Inhale 2 puffs into the lungs 2 (two) times daily. Take 2 puffs first thing in the Am and then another 2 puffs about 12 hours later.   3 Inhaler   3   . cetirizine (ZYRTEC) 10 MG tablet   Oral   Take 10 mg by mouth daily.         [name] ibuprofen (ADVIL,MOTRIN) 200 MG tablet   Oral   Take 200 mg by mouth once as needed. For pain--take as directed on bottle         . Multiple Vitamin (MULITIVITAMIN WITH MINERALS) TABS   Oral   Take 1 tablet by mouth daily.         [name] olmesartan-hydrochlorothiazide (BENICAR HCT) 40-25 MG per tablet   Oral   Take 1 tablet by mouth daily.   30 tablet   0   . pantoprazole (PROTONIX) 40 MG tablet   Oral   Take 1 tablet (40 mg total) by mouth daily. Take 30- 60 min before your first meal of the day   90 tablet   1    BP 167/94  Pulse 94  Temp(Src) 98.4 F (36.9 C) (Oral)  Resp 16  SpO2 99%  LMP 06/27/2012 Physical Exam  Nursing note and vitals reviewed. Constitutional: She is oriented to person, place, and time. She appears well-developed and well-nourished. No distress.  Pleasant obese female in no acute distress  HENT:  Head: Normocephalic and atraumatic.  Mouth/Throat: Oropharynx is clear and moist.  Eyes: Conjunctivae and EOM are normal. Pupils are equal, round, and reactive to light.  Neck: Normal range of motion. Neck supple.  Cardiovascular: Normal rate, regular rhythm and intact distal pulses.   Murmur heard.  Systolic murmur is present with a grade of 2/6  Pulmonary/Chest: Effort normal and breath sounds normal. No respiratory distress. She has no wheezes. She has no rales. She exhibits tenderness.    Abdominal: Soft. She exhibits no distension. There is no tenderness. There  is no rebound and no guarding.  Musculoskeletal: Normal range of motion. She exhibits no edema and no tenderness.  Neurological: She is alert and oriented to person, place, and time.  Skin: Skin is warm and dry. No rash noted. No erythema.  Psychiatric: She has a normal mood and affect. Her behavior is normal.    ED Course  Procedures (including critical care time) Labs Review Labs Reviewed  CBC WITH DIFFERENTIAL - Abnormal; Notable for the following:    Hemoglobin  11.7 (*)    RDW 16.8 (*)    All other components within normal limits  BASIC METABOLIC PANEL - Abnormal; Notable for the following:    Glucose, Bld 125 (*)    All other components within normal limits  D-DIMER, QUANTITATIVE - Abnormal; Notable for the following:    D-Dimer, Quant 0.68 (*)    All other components within normal limits  TROPONIN I  TROPONIN I   Imaging Review Dg Chest 2 View  07/12/2013   CLINICAL DATA:  Chest pain.  EXAM: CHEST  2 VIEW  COMPARISON:  November 12, 2011.  FINDINGS: The heart size and mediastinal contours are within normal limits. Both lungs are clear. No pleural effusion or pneumothorax is noted. The visualized skeletal structures are unremarkable.  IMPRESSION: No acute cardiopulmonary abnormality seen.   Electronically Signed   By: Roque LiasJames  Green M.D.   On: 07/12/2013 10:10   Ct Angio Chest Pe W/cm &/or Wo Cm  07/12/2013   CLINICAL DATA:  Chest tightness and shortness of breath  EXAM: CT ANGIOGRAPHY CHEST WITH CONTRAST  TECHNIQUE: Multidetector CT imaging of the chest was performed using the standard protocol during bolus administration of intravenous contrast. Multiplanar CT image reconstructions and MIPs were obtained to evaluate the vascular anatomy.  CONTRAST:  100mL OMNIPAQUE IOHEXOL 350 MG/ML SOLN  COMPARISON:  Chest radiograph July 12, 2013  FINDINGS: There is no demonstrable pulmonary embolus. There is no thoracic aortic aneurysm or dissection.  There is no edema or consolidation. There are  scattered areas of rather minimal atelectatic change bilaterally.  There is moderate mediastinal fat. There is no appreciable thoracic adenopathy. Pericardium is not thickened.  Visualized upper abdominal structures appear normal. There are no apparent blastic or lytic bone lesions. Visualized thyroid appears normal.  Review of the MIP images confirms the above findings.  IMPRESSION: No demonstrable pulmonary embolus. Areas of mild subsegmental atelectasis bilaterally. No edema or consolidation.   Electronically Signed   By: Bretta BangWilliam  Woodruff M.D.   On: 07/12/2013 11:38     EKG Interpretation   Date/Time:  Tuesday July 12 2013 09:19:47 EST Ventricular Rate:  101 PR Interval:  184 QRS Duration: 76 QT Interval:  322 QTC Calculation: 417 R Axis:   51 Text Interpretation:  Sinus tachycardia Septal infarct , age undetermined  No significant change since last tracing Confirmed by Anitra LauthPLUNKETT  MD,  Alphonzo LemmingsWHITNEY ([id number]) on 07/12/2013 9:23:27 AM      MDM   Final diagnoses:  Chest pain, atypical    Patient with a prior history of asthma, hypertension and GERD he presents today with one hour of persistent pleuritic sharp pressure-like chest pain in the left side of her chest that goes into her left arm and makes her feel slightly short of breath. She denies any nausea, vomiting or diaphoresis. She is low risk well as criteria but was tachycardic upon arrival without recent surgery, immobilization or unilateral leg pain or swelling. Her only cardiac risk factor is hypertension without strong family history, smoking history and no history of diabetes.  Pain is reproducible with palpation but she feels that this is different than an asthma attack. Without intervention the pain subsided within 30 minutes upon arrival.  Patient is in no acute distress and satting 100% on room air. The pain was not associated with eating and started at rest it has not affected by exertion. Heart score 1 and low risk.  Lower  suspicion of coronary artery disease.  Possible lung pathology, PE, musculoskeletal  chest pain versus esophageal spasm.  EKG shows sinus tachycardia but otherwise is unremarkable. Chest x-ray, CBC, BMP, d-dimer, troponin ordered.  Labs and x-ray are within normal limits except for a positive d-dimer. A CTA was done which showed no PE and some mild areas of atelectasis. The second troponin 3 hours after the initial start of the pain was negative. Patient is currently pain free and feels better. discussed results with the patient and encouraged her to follow up with her regular doctor and try ibuprofen to see if that improves her pain if it is to return. Gave the patient strict return precautions and warning signs and suggested that if the pain continues she may need a stress test in the future.    Gwyneth Sprout, MD 07/12/13 [phone]

## 2013-07-12 NOTE — Discharge Instructions (Signed)
Chest Pain (Nonspecific) °It is often hard to give a specific diagnosis for the cause of chest pain. There is always a chance that your pain could be related to something serious, such as a heart attack or a blood clot in the lungs. You need to follow up with your caregiver for further evaluation. °CAUSES  °· Heartburn. °· Pneumonia or bronchitis. °· Anxiety or stress. °· Inflammation around your heart (pericarditis) or lung (pleuritis or pleurisy). °· A blood clot in the lung. °· A collapsed lung (pneumothorax). It can develop suddenly on its own (spontaneous pneumothorax) or from injury (trauma) to the chest. °· Shingles infection (herpes zoster virus). °The chest wall is composed of bones, muscles, and cartilage. Any of these can be the source of the pain. °· The bones can be bruised by injury. °· The muscles or cartilage can be strained by coughing or overwork. °· The cartilage can be affected by inflammation and become sore (costochondritis). °DIAGNOSIS  °Lab tests or other studies, such as X-rays, electrocardiography, stress testing, or cardiac imaging, may be needed to find the cause of your pain.  °TREATMENT  °· Treatment depends on what may be causing your chest pain. Treatment may include: °· Acid blockers for heartburn. °· Anti-inflammatory medicine. °· Pain medicine for inflammatory conditions. °· Antibiotics if an infection is present. °· You may be advised to change lifestyle habits. This includes stopping smoking and avoiding alcohol, caffeine, and chocolate. °· You may be advised to keep your head raised (elevated) when sleeping. This reduces the chance of acid going backward from your stomach into your esophagus. °· Most of the time, nonspecific chest pain will improve within 2 to 3 days with rest and mild pain medicine. °HOME CARE INSTRUCTIONS  °· If antibiotics were prescribed, take your antibiotics as directed. Finish them even if you start to feel better. °· For the next few days, avoid physical  activities that bring on chest pain. Continue physical activities as directed. °· Do not smoke. °· Avoid drinking alcohol. °· Only take over-the-counter or prescription medicine for pain, discomfort, or fever as directed by your caregiver. °· Follow your caregiver's suggestions for further testing if your chest pain does not go away. °· Keep any follow-up appointments you made. If you do not go to an appointment, you could develop lasting (chronic) problems with pain. If there is any problem keeping an appointment, you must call to reschedule. °SEEK MEDICAL CARE IF:  °· You think you are having problems from the medicine you are taking. Read your medicine instructions carefully. °· Your chest pain does not go away, even after treatment. °· You develop a rash with blisters on your chest. °SEEK IMMEDIATE MEDICAL CARE IF:  °· You have increased chest pain or pain that spreads to your arm, neck, jaw, back, or abdomen. °· You develop shortness of breath, an increasing cough, or you are coughing up blood. °· You have severe back or abdominal pain, feel nauseous, or vomit. °· You develop severe weakness, fainting, or chills. °· You have a fever. °THIS IS AN EMERGENCY. Do not wait to see if the pain will go away. Get medical help at once. Call your local emergency services (911 in [place] Do not drive yourself to the hospital. °MAKE SURE YOU:  °· Understand these instructions. °· Will watch your condition. °· Will get help right away if you are not doing well or get worse. °Document Released: 02/05/2005 Document Revised: 07/21/2011 Document Reviewed: 12/02/2007 °[employer]® Patient Information ©2014 [employer]   [employer]. ° °

## 2013-07-12 NOTE — ED Notes (Signed)
Patient transported to X-ray 

## 2013-07-25 ENCOUNTER — Ambulatory Visit (INDEPENDENT_AMBULATORY_CARE_PROVIDER_SITE_OTHER): Payer: Managed Care, Other (non HMO) | Admitting: Family

## 2013-07-25 VITALS — BP 146/80 | HR 102 | Temp 97.8°F | Resp 16 | Ht 66.0 in | Wt >= 6400 oz

## 2013-07-25 DIAGNOSIS — I10 Essential (primary) hypertension: Secondary | ICD-10-CM

## 2013-07-25 MED ORDER — CLONIDINE HCL 0.1 MG PO TABS: 0.1000 mg | ORAL_TABLET | Freq: Two times a day (BID) | ORAL | Status: DC

## 2013-07-25 NOTE — Progress Notes (Signed)
Pt here for nurse visit BP check. Pt states she was unable to tolerate increased dose of Benicar HCT as it made her exteremely dizzy. Has been taking 20/12.5mg  samples x 1 1/2 weeks and dizziness has improved. Per verbal from Provider, sent rx of clonidine 0.1mg  twice a day to take in addition to benicar hct 20/12.5mg . Pt instructed to follow up in 2 weeks for nurse visit BP check.

## 2013-07-25 NOTE — Assessment & Plan Note (Signed)
Pt presents for nurse visit today.  BP remains above goal. Did not tolerate higher dose of benicar HCT.  Will add catapres 0.1mg  bid. Follow up in 2 weeks.

## 2013-07-25 NOTE — Patient Instructions (Signed)
Start Clonidine 0.1mg  two times a day for your blood pressure. Continue Benicar HCT 20/12.5mg  once a day. Return in 2 weeks for nurse visit blood pressure check.

## 2013-08-08 ENCOUNTER — Encounter (HOSPITAL_COMMUNITY): Payer: Self-pay | Admitting: Emergency Medicine

## 2013-08-08 ENCOUNTER — Emergency Department (HOSPITAL_COMMUNITY): Payer: Managed Care, Other (non HMO)

## 2013-08-08 ENCOUNTER — Emergency Department (HOSPITAL_COMMUNITY)
Admission: EM | Admit: 2013-08-08 | Discharge: 2013-08-08 | Disposition: A | Discharge status: Home / Self Care | Payer: Managed Care, Other (non HMO) | Attending: Emergency Medicine | Admitting: Emergency Medicine

## 2013-08-08 DIAGNOSIS — R011 Cardiac murmur, unspecified: Secondary | ICD-10-CM | POA: Insufficient documentation

## 2013-08-08 DIAGNOSIS — R0789 Other chest pain: Secondary | ICD-10-CM | POA: Insufficient documentation

## 2013-08-08 DIAGNOSIS — Z79899 Other long term (current) drug therapy: Secondary | ICD-10-CM | POA: Insufficient documentation

## 2013-08-08 DIAGNOSIS — G43909 Migraine, unspecified, not intractable, without status migrainosus: Secondary | ICD-10-CM | POA: Insufficient documentation

## 2013-08-08 DIAGNOSIS — K219 Gastro-esophageal reflux disease without esophagitis: Secondary | ICD-10-CM | POA: Insufficient documentation

## 2013-08-08 DIAGNOSIS — J45909 Unspecified asthma, uncomplicated: Secondary | ICD-10-CM | POA: Insufficient documentation

## 2013-08-08 DIAGNOSIS — Z91040 Latex allergy status: Secondary | ICD-10-CM | POA: Insufficient documentation

## 2013-08-08 DIAGNOSIS — R51 Headache: Secondary | ICD-10-CM

## 2013-08-08 DIAGNOSIS — I10 Essential (primary) hypertension: Secondary | ICD-10-CM | POA: Insufficient documentation

## 2013-08-08 DIAGNOSIS — IMO0002 Reserved for concepts with insufficient information to code with codable children: Secondary | ICD-10-CM | POA: Insufficient documentation

## 2013-08-08 DIAGNOSIS — Z9981 Dependence on supplemental oxygen: Secondary | ICD-10-CM | POA: Insufficient documentation

## 2013-08-08 DIAGNOSIS — G4733 Obstructive sleep apnea (adult) (pediatric): Secondary | ICD-10-CM | POA: Insufficient documentation

## 2013-08-08 DIAGNOSIS — R519 Headache, unspecified: Secondary | ICD-10-CM

## 2013-08-08 DIAGNOSIS — Z87828 Personal history of other (healed) physical injury and trauma: Secondary | ICD-10-CM | POA: Insufficient documentation

## 2013-08-08 LAB — CBC
HEMATOCRIT: 36.8 % (ref 36.0–46.0)
Hemoglobin: 11.9 g/dL — ABNORMAL LOW (ref 12.0–15.0)
MCH: 26.7 pg (ref 26.0–34.0)
MCHC: 32.3 g/dL (ref 30.0–36.0)
MCV: 82.7 fL (ref 78.0–100.0)
Platelets: 362 10*3/uL (ref 150–400)
RBC: 4.45 MIL/uL (ref 3.87–5.11)
RDW: 16 % — ABNORMAL HIGH (ref 11.5–15.5)
WBC: 9.8 10*3/uL (ref 4.0–10.5)

## 2013-08-08 LAB — COMPREHENSIVE METABOLIC PANEL
ALK PHOS: 83 U/L (ref 39–117)
ALT: 16 U/L (ref 0–35)
AST: 16 U/L (ref 0–37)
Albumin: 3.2 g/dL — ABNORMAL LOW (ref 3.5–5.2)
BILIRUBIN TOTAL: 0.3 mg/dL (ref 0.3–1.2)
BUN: 12 mg/dL (ref 6–23)
CHLORIDE: 103 meq/L (ref 96–112)
CO2: 23 mEq/L (ref 19–32)
CREATININE: 0.66 mg/dL (ref 0.50–1.10)
Calcium: 9.2 mg/dL (ref 8.4–10.5)
GFR calc Af Amer: >90 mL/min (ref >90)
GFR calc non Af Amer: >90 mL/min (ref >90)
Glucose, Bld: 138 mg/dL — ABNORMAL HIGH (ref 70–99)
POTASSIUM: 4.3 meq/L (ref 3.7–5.3)
Sodium: 139 mEq/L (ref 137–147)
Total Protein: 7.6 g/dL (ref 6.0–8.3)

## 2013-08-08 LAB — URINALYSIS, ROUTINE W REFLEX MICROSCOPIC
BILIRUBIN URINE: NEGATIVE
Glucose, UA: NEGATIVE mg/dL
HGB URINE DIPSTICK: NEGATIVE
KETONES UR: NEGATIVE mg/dL
Leukocytes, UA: NEGATIVE
Nitrite: NEGATIVE
Protein, ur: NEGATIVE mg/dL
Specific Gravity, Urine: 1.017 (ref 1.005–1.030)
UROBILINOGEN UA: 0.2 mg/dL (ref 0.0–1.0)
pH: 7 (ref 5.0–8.0)

## 2013-08-08 LAB — I-STAT TROPONIN, ED
Troponin i, poc: 0 ng/mL (ref 0.00–0.08)
Troponin i, poc: 0 ng/mL (ref 0.00–0.08)

## 2013-08-08 MED ORDER — KETOROLAC TROMETHAMINE 30 MG/ML IJ SOLN
30.0000 mg | Freq: Once | INTRAMUSCULAR | Status: AC
  Administered 2013-08-08: 30 mg via INTRAVENOUS
  Filled 2013-08-08: qty 1

## 2013-08-08 MED ORDER — SODIUM CHLORIDE 0.9 % IV BOLUS (SEPSIS)
500.0000 mL | Freq: Once | INTRAVENOUS | Status: AC
  Administered 2013-08-08: 500 mL via INTRAVENOUS

## 2013-08-08 MED ORDER — PROCHLORPERAZINE EDISYLATE 5 MG/ML IJ SOLN
10.0000 mg | Freq: Once | INTRAMUSCULAR | Status: AC
  Administered 2013-08-08: 10 mg via INTRAVENOUS
  Filled 2013-08-08: qty 2

## 2013-08-08 MED ORDER — ASPIRIN 81 MG PO CHEW: 324.0000 mg | CHEWABLE_TABLET | Freq: Once | ORAL | Status: DC

## 2013-08-08 MED ORDER — DIPHENHYDRAMINE HCL 50 MG/ML IJ SOLN
25.0000 mg | Freq: Once | INTRAMUSCULAR | Status: AC
Start: 2013-08-08 — End: 2013-08-08
  Administered 2013-08-08: 25 mg via INTRAVENOUS
  Filled 2013-08-08: qty 1

## 2013-08-08 NOTE — ED Notes (Signed)
Patient transported to X-ray 

## 2013-08-08 NOTE — ED Provider Notes (Signed)
Medical screening examination/treatment/procedure(s) were performed by non-physician practitioner and as supervising physician I was immediately available for consultation/collaboration.   EKG Interpretation   Date/Time:  Monday August 08 2013 09:14:01 EDT Ventricular Rate:  90 PR Interval:  206 QRS Duration: 72 QT Interval:  347 QTC Calculation: 424 R Axis:   47 Text Interpretation:  Sinus rhythm Borderline prolonged PR interval  Anteroseptal infarct, old Borderline T abnormalities, inferior leads No  significant change since last tracing Confirmed by [facility]  MD, Danford Tat  [phone]([id number]) on 08/08/2013 10:48:15 AM        Gwyneth SproutWhitney Kennedey Digilio, MD 08/08/13 2141

## 2013-08-08 NOTE — ED Provider Notes (Signed)
CSN: [id number]     Arrival date & time 08/08/13  0905 History   First MD Initiated Contact with Patient 08/08/13 0913     Chief Complaint  Patient presents with  . Chest Pain     (Consider location/radiation/quality/duration/timing/severity/associated sxs/prior Treatment) Patient is a 38 y.o. female presenting with chest pain. The history is provided by the patient and medical records.  Chest Pain Pain location:  L chest and substernal area Pain quality: aching, pressure and stabbing   Pain quality: not radiating   Pain radiates to:  Does not radiate Pain radiates to the back: no   Pain severity:  Moderate Onset quality:  Sudden Timing:  Constant Progression:  Unchanged Chronicity:  Recurrent Context: at rest   Context: not breathing and no movement   Relieved by:  None tried Worsened by:  Nothing tried Associated symptoms: headache and nausea   Associated symptoms: no abdominal pain, no anorexia, no anxiety, no back pain, no cough, no diaphoresis, no dizziness, no dysphagia, no fatigue, no fever, no heartburn, no near-syncope, no numbness, no orthopnea, no shortness of breath, not vomiting and no weakness   Headaches:    Severity:  Moderate   Onset quality:  Gradual   Duration:  5 hours   Timing:  Constant   Chronicity:  Recurrent (Hx of  migraines. headace is same as previous) Risk factors: hypertension   Risk factors: no prior DVT/PE   Risk factors comment:  Morbid obesity   Past Medical History  Diagnosis Date  . Asthma     history of  . Murmur, cardiac   . Retinal hole or tear     left eye- 1996 due to injury  . Obstructive sleep apnea on CPAP   . Morbid obesity   . Arrhythmia   . OSA (obstructive sleep apnea) 08/30/2010  . GERD (gastroesophageal reflux disease)   . H/O hiatal hernia   . Headache(784.0)     migraines  . Hypertension    Past Surgical History  Procedure Laterality Date  . Cesarean section  4/04  . Hernia repair  04/2009    abdominal  hernia  . Wisdom tooth extraction  1999  . Eye surgery  11/2010    lasar repair of retinal tear  . Tubal ligation    . Esophagogastroduodenoscopy  12/30/2011    Procedure: ESOPHAGOGASTRODUODENOSCOPY (EGD);  Surgeon: [name] DareMalcolm T Stark, MD,FACG;  Location[facility];  Service: Endoscopy;  Laterality: N/A;  . Laparoscopic total hysterectomy  03/17/13  . Abdominal hysterectomy     Family History  Problem Relation Age of Onset  . Hypertension Mother   . Diabetes Mother     type II  . Other Mother     cervical dysplasia  . Ovarian cancer Mother   . Hypertension Father   . Bipolar disorder Father   . Heart disease Maternal Grandmother   . Hypertension Maternal Grandmother   . Bipolar disorder Maternal Grandmother   . Hypertension Maternal Grandfather   . Hypertension Paternal Grandmother   . Hypertension Paternal Grandfather   . Hypertension Sister   . Heart attack Neg Hx   . Hyperlipidemia Neg Hx   . Sudden death Neg Hx    History  Substance Use Topics  . Smoking status: Never Smoker   . Smokeless tobacco: Never Used  . Alcohol Use: No   OB History   Grav Para Term Preterm Abortions TAB SAB Ect Mult Living  Review of Systems  Constitutional: Negative for fever, diaphoresis and fatigue.  HENT: Negative for trouble swallowing.   Respiratory: Negative for cough and shortness of breath.   Cardiovascular: Positive for chest pain. Negative for orthopnea and near-syncope.  Gastrointestinal: Positive for nausea. Negative for heartburn, vomiting, abdominal pain and anorexia.  Musculoskeletal: Negative for back pain.  Neurological: Positive for headaches. Negative for dizziness, weakness and numbness.      Allergies  Amlodipine; Peppermint oil; Fluticasone-salmeterol; Latex; and Spinach  Home Medications   Current Outpatient Rx  Name  Route  Sig  Dispense  Refill  . albuterol (PROAIR HFA) 108 (90 BASE) MCG/ACT inhaler   Inhalation   Inhale 2 puffs into the  lungs every 4 (four) hours as needed for wheezing or shortness of breath.   3 Inhaler   3   . albuterol (PROVENTIL) (2.5 MG/3ML) 0.083% nebulizer solution   Nebulization   Take 3 mLs (2.5 mg total) by nebulization every 4 (four) hours as needed. For shortness of breath or wheezing   225 mL   3   . beclomethasone (QVAR) 40 MCG/ACT inhaler   Inhalation   Inhale 2 puffs into the lungs 2 (two) times daily. Take 2 puffs first thing in the Am and then another 2 puffs about 12 hours later.   3 Inhaler   3   . cetirizine (ZYRTEC) 10 MG tablet   Oral   Take 10 mg by mouth daily.         . cloNIDine (CATAPRES) 0.1 MG tablet   Oral   Take 1 tablet (0.1 mg total) by mouth 2 (two) times daily.   60 tablet   0   . ibuprofen (ADVIL,MOTRIN) 200 MG tablet   Oral   Take 200 mg by mouth once as needed. For pain--take as directed on bottle         . miconazole (MICOTIN) 200 MG vaginal suppository   Vaginal   Place 200 mg vaginally at bedtime.         . Multiple Vitamin (MULITIVITAMIN WITH MINERALS) TABS   Oral   Take 1 tablet by mouth daily.         [name] olmesartan-hydrochlorothiazide (BENICAR HCT) 20-12.5 MG per tablet   Oral   Take 1 tablet by mouth daily.         . pantoprazole (PROTONIX) 40 MG tablet   Oral   Take 1 tablet (40 mg total) by mouth daily. Take 30- 60 min before your first meal of the day   90 tablet   1    BP 155/68  Pulse 91  Temp(Src) 97.8 F (36.6 C)  Resp 26  Ht 5\' 6"  (1.676 m)  Wt 398 lb (180.532 kg)  BMI 64.27 kg/m2  SpO2 100%  LMP 06/27/2012 Physical Exam Physical Exam  Nursing note and vitals reviewed. Constitutional: pleasant morbidly obese female. NAD HENT:  Head: Normocephalic and atraumatic.  Eyes: Conjunctivae normal and EOM are normal. Pupils are equal, round, and reactive to light. No scleral icterus.  Neck: Normal range of motion.  Cardiovascular: Normal rate, regular rhythm and normal heart sounds.  Exam reveals no gallop and no  friction rub.   No murmur heard. Pulmonary/Chest: Effort normal and breath sounds normal. No respiratory distress.  Abdominal: Soft. Bowel sounds are normal. She exhibits no distension and no mass. There is no tenderness. There is no guarding.  Neurological: She is alert and oriented to person, place, and time.  Speech is clear and  goal oriented, follows commands Major Cranial nerves without deficit, no facial droop Normal strength in upper and lower extremities bilaterally including dorsiflexion and plantar flexion, strong and equal grip strength Sensation normal to light and sharp touch Moves extremities without ataxia, coordination intact Normal finger to nose and rapid alternating movements Skin: Skin is warm and dry. She is not diaphoretic.    ED Course  Procedures (including critical care time) Labs Review Labs Reviewed  CBC - Abnormal; Notable for the following:    Hemoglobin 11.9 (*)    RDW 16.0 (*)    All other components within normal limits  COMPREHENSIVE METABOLIC PANEL - Abnormal; Notable for the following:    Glucose, Bld 138 (*)    Albumin 3.2 (*)    All other components within normal limits  URINALYSIS, ROUTINE W REFLEX MICROSCOPIC  I-STAT TROPOININ, ED  I-STAT TROPOININ, ED   Imaging Review Dg Chest 2 View  08/08/2013   CLINICAL DATA:  Left chest pain, obesity  EXAM: CHEST  2 VIEW  COMPARISON:  07/12/2013  FINDINGS: The heart size and mediastinal contours are within normal limits. Both lungs are clear. The visualized skeletal structures are unremarkable.  IMPRESSION: No active cardiopulmonary disease.   Electronically Signed   By: Ruel Favors M.D.   On: 08/08/2013 10:14     EKG Interpretation None      MDM   Final diagnoses:  Chest pain, atypical  HTN (hypertension)  Heart murmur, systolic  Headache   Patient with previous workups here in the ED for same. Normal CTA and CXR on 07/12/2013. Labs look okay today and no changes on EKG. HA resolved with  Migraine cocktail   Delta troponin is pending.   2:06 PM Filed Vitals:   08/08/13 1230 08/08/13 1300 08/08/13 1327 08/08/13 1330  BP: 125/76 127/76 127/76 136/77  Pulse: 74 89 74 82  Temp:   98.1 F (36.7 C)   TempSrc:   Oral   Resp: 22 18 15 13   Height:      Weight:      SpO2: 97% 100% 98% 98%    Patient delta troponin negative. Headache and cp relieved.\ Hemodynamically stable. Doubt PE, aortic aneurysm, mi, or other emegent cause of CP Doubt SAH, meningitis, temporal arteritis. F/u with cardiology and PCP.  The patient appears reasonably screened and/or stabilized for discharge and I doubt any other medical condition or other [facility] requiring further screening, evaluation, or treatment in the ED at this time prior to discharge.    Arthor Captain, PA-C 08/08/13 1410

## 2013-08-08 NOTE — Discharge Instructions (Signed)
Your caregiver has diagnosed you as having chest pain that is not specific for one problem, but does not require admission.  You are at low risk for an acute heart condition or other serious illness. Chest pain comes from many different causes.  SEEK IMMEDIATE MEDICAL ATTENTION IF: You have severe chest pain, especially if the pain is crushing or pressure-like and spreads to the arms, back, neck, or jaw, or if you have sweating, nausea (feeling sick to your stomach), or shortness of breath. THIS IS AN EMERGENCY. Don't wait to see if the pain will go away. Get medical help at once. Call 911 or 0 (operator). DO NOT drive yourself to the hospital.  Your chest pain gets worse and does not go away with rest.  You have an attack of chest pain lasting longer than usual, despite rest and treatment with the medications your caregiver has prescribed.  You wake from sleep with chest pain or shortness of breath.  You feel dizzy or faint.  You have chest pain not typical of your usual pain for which you originally saw your caregiver.  Migraine Headache A migraine headache is an intense, throbbing pain on one or both sides of your head. A migraine can last for 30 minutes to several hours. CAUSES  The exact cause of a migraine headache is not always known. However, a migraine may be caused when nerves in the brain become irritated and release chemicals that cause inflammation. This causes pain. Certain things may also trigger migraines, such as:  Alcohol.  Smoking.  Stress.  Menstruation.  Aged cheeses.  Foods or drinks that contain nitrates, glutamate, aspartame, or tyramine.  Lack of sleep.  Chocolate.  Caffeine.  Hunger.  Physical exertion.  Fatigue.  Medicines used to treat chest pain (nitroglycerine), birth control pills, estrogen, and some blood pressure medicines. SIGNS AND SYMPTOMS  Pain on one or both sides of your head.  Pulsating or throbbing pain.  Severe pain that prevents  daily activities.  Pain that is aggravated by any physical activity.  Nausea, vomiting, or both.  Dizziness.  Pain with exposure to bright lights, loud noises, or activity.  General sensitivity to bright lights, loud noises, or smells. Before you get a migraine, you may get warning signs that a migraine is coming (aura). An aura may include:  Seeing flashing lights.  Seeing bright spots, halos, or zig-zag lines.  Having tunnel vision or blurred vision.  Having feelings of numbness or tingling.  Having trouble talking.  Having muscle weakness. DIAGNOSIS  A migraine headache is often diagnosed based on:  Symptoms.  Physical exam.  A CT scan or MRI of your head. These imaging tests cannot diagnose migraines, but they can help rule out other causes of headaches. TREATMENT Medicines may be given for pain and nausea. Medicines can also be given to help prevent recurrent migraines.  HOME CARE INSTRUCTIONS  Only take over-the-counter or prescription medicines for pain or discomfort as directed by your health care provider. The use of long-term narcotics is not recommended.  Lie down in a dark, quiet room when you have a migraine.  Keep a journal to find out what may trigger your migraine headaches. For example, write down:  What you eat and drink.  How much sleep you get.  Any change to your diet or medicines.  Limit alcohol consumption.  Quit smoking if you smoke.  Get 7 9 hours of sleep, or as recommended by your health care provider.  Limit stress.  Keep lights dim if bright lights bother you and make your migraines worse. SEEK IMMEDIATE MEDICAL CARE IF:   Your migraine becomes severe.  You have a fever.  You have a stiff neck.  You have vision loss.  You have muscular weakness or loss of muscle control.  You start losing your balance or have trouble walking.  You feel faint or pass out.  You have severe symptoms that are different from your first  symptoms. MAKE SURE YOU:   Understand these instructions.  Will watch your condition.  Will get help right away if you are not doing well or get worse. Document Released: 04/28/2005 Document Revised: 02/16/2013 Document Reviewed: 01/03/2013 [facility] Patient Information 2014 [place], [place]. Chest Pain (Nonspecific) It is often hard to give a specific diagnosis for the cause of chest pain. There is always a chance that your pain could be related to something serious, such as a heart attack or a blood clot in the lungs. You need to follow up with your caregiver for further evaluation. CAUSES   Heartburn.  Pneumonia or bronchitis.  Anxiety or stress.  Inflammation around your heart (pericarditis) or lung (pleuritis or pleurisy).  A blood clot in the lung.  A collapsed lung (pneumothorax). It can develop suddenly on its own (spontaneous pneumothorax) or from injury (trauma) to the chest.  Shingles infection (herpes zoster virus). The chest wall is composed of bones, muscles, and cartilage. Any of these can be the source of the pain.  The bones can be bruised by injury.  The muscles or cartilage can be strained by coughing or overwork.  The cartilage can be affected by inflammation and become sore (costochondritis). DIAGNOSIS  Lab tests or other studies, such as X-rays, electrocardiography, stress testing, or cardiac imaging, may be needed to find the cause of your pain.  TREATMENT   Treatment depends on what may be causing your chest pain. Treatment may include:  Acid blockers for heartburn.  Anti-inflammatory medicine.  Pain medicine for inflammatory conditions.  Antibiotics if an infection is present.  You may be advised to change lifestyle habits. This includes stopping smoking and avoiding alcohol, caffeine, and chocolate.  You may be advised to keep your head raised (elevated) when sleeping. This reduces the chance of acid going backward from your stomach into your  esophagus.  Most of the time, nonspecific chest pain will improve within 2 to 3 days with rest and mild pain medicine. HOME CARE INSTRUCTIONS   If antibiotics were prescribed, take your antibiotics as directed. Finish them even if you start to feel better.  For the next few days, avoid physical activities that bring on chest pain. Continue physical activities as directed.  Do not smoke.  Avoid drinking alcohol.  Only take over-the-counter or prescription medicine for pain, discomfort, or fever as directed by your caregiver.  Follow your caregiver's suggestions for further testing if your chest pain does not go away.  Keep any follow-up appointments you made. If you do not go to an appointment, you could develop lasting (chronic) problems with pain. If there is any problem keeping an appointment, you must call to reschedule. SEEK MEDICAL CARE IF:   You think you are having problems from the medicine you are taking. Read your medicine instructions carefully.  Your chest pain does not go away, even after treatment.  You develop a rash with blisters on your chest. SEEK IMMEDIATE MEDICAL CARE IF:   You have increased chest pain or pain that spreads to your  arm, neck, jaw, back, or abdomen.  You develop shortness of breath, an increasing cough, or you are coughing up blood.  You have severe back or abdominal pain, feel nauseous, or vomit.  You develop severe weakness, fainting, or chills.  You have a fever. THIS IS AN EMERGENCY. Do not wait to see if the pain will go away. Get medical help at once. Call your local emergency services (911 in [place] Do not drive yourself to the hospital. MAKE SURE YOU:   Understand these instructions.  Will watch your condition.  Will get help right away if you are not doing well or get worse. Document Released: 02/05/2005 Document Revised: 07/21/2011 Document Reviewed: 12/02/2007 [facility] Patient Information 2014 [place], [place].

## 2013-08-08 NOTE — ED Notes (Signed)
Received pt from home with c/o center to left sided chest pain that radiates to left arm onset today upon waking up. Pt given 324 mg of ASA, 8 mg of zofran, and 2 nitro by EMS. Pain initially 8/10 decreased to 5/10 after nitro.

## 2014-02-02 ENCOUNTER — Encounter: Payer: Self-pay | Admitting: Family Medicine

## 2014-02-02 ENCOUNTER — Ambulatory Visit (INDEPENDENT_AMBULATORY_CARE_PROVIDER_SITE_OTHER): Payer: 59 | Admitting: Family Medicine

## 2014-02-02 VITALS — BP 162/86 | HR 99 | Temp 98.1°F | Resp 16 | Ht 66.75 in | Wt >= 6400 oz

## 2014-02-02 DIAGNOSIS — J45909 Unspecified asthma, uncomplicated: Secondary | ICD-10-CM

## 2014-02-02 DIAGNOSIS — Z23 Encounter for immunization: Secondary | ICD-10-CM

## 2014-02-02 DIAGNOSIS — G4733 Obstructive sleep apnea (adult) (pediatric): Secondary | ICD-10-CM

## 2014-02-02 DIAGNOSIS — R079 Chest pain, unspecified: Secondary | ICD-10-CM | POA: Insufficient documentation

## 2014-02-02 DIAGNOSIS — I10 Essential (primary) hypertension: Secondary | ICD-10-CM

## 2014-02-02 DIAGNOSIS — R0789 Other chest pain: Secondary | ICD-10-CM

## 2014-02-02 DIAGNOSIS — J309 Allergic rhinitis, unspecified: Secondary | ICD-10-CM

## 2014-02-02 MED ORDER — LOSARTAN POTASSIUM-HCTZ 50-12.5 MG PO TABS: 1.0000 | ORAL_TABLET | Freq: Every day | ORAL | Status: DC

## 2014-02-02 NOTE — Patient Instructions (Signed)
Follow up in 4-6 weeks to recheck BP We'll notify you of your lab results and make any changes if needed Start the Losartan HCTZ for BP and swelling (this is not as strong as your previous medication) Your EKG is unchanged from previous but due to the ongoing chest pain, it is important to see a cardiologist- we'll call you with that appt Restart your CPAP machine nightly- this will improve headaches, BP, and fatigue Try and get regular exercise and make healthy food choices Continue the Zyrtec daily and add OTC nasal steroid to improve congestion/inflammation Drink plenty of fluids REST! Call with any questions or concerns Hang in there!  We'll get back on track!

## 2014-02-02 NOTE — Progress Notes (Signed)
   Subjective:    Patient ID: Amber Salas, female    DOB: 12-Jan-1976, 38 y.o.   MRN: [id number]  HPI Transfer pt.  Previous PCP- [name]  HTN- chronic problem, previously on Benicar HCTZ and Clonidine.  'Benicar was just way too strong'.  + dizziness, detached feeling.  Stopped meds 3-4 months ago.  Increased swelling of feet.  + CP- 'like my heart is being squeezed that then there is electricity radiating down my L arm'.  Occuring 1-2x/day.  Has not seen cards recently.  No SOB, + HAs.  Obesity- pt has gained 26 lbs since last visit.  Pt has not been as active as previous.  OSA- chronic problem.  Has not used CPAP since August.  Allergic rhinitis- nasal congestion, PND, cough, itchy eyes.  Not currently on allergy meds.   Review of Systems For ROS see HPI     Objective:   Physical Exam  Constitutional: She is oriented to person, place, and time. She appears well-developed and well-nourished. No distress.  HENT:  Head: Normocephalic and atraumatic.  Right Ear: Tympanic membrane normal.  Left Ear: Tympanic membrane normal.  Nose: Mucosal edema and rhinorrhea present. Right sinus exhibits no maxillary sinus tenderness and no frontal sinus tenderness. Left sinus exhibits no maxillary sinus tenderness and no frontal sinus tenderness.  Mouth/Throat: Mucous membranes are normal. Posterior oropharyngeal erythema (w/ PND) present.  Eyes: Conjunctivae and EOM are normal. Pupils are equal, round, and reactive to light.  Neck: Normal range of motion. Neck supple.  Cardiovascular: Normal rate, regular rhythm, normal heart sounds and intact distal pulses.   Pulmonary/Chest: Effort normal and breath sounds normal. No respiratory distress. She has no wheezes. She has no rales.  Musculoskeletal: She exhibits edema (bilateral LE edema).  Lymphadenopathy:    She has no cervical adenopathy.  Neurological: She is alert and oriented to person, place, and time.  Skin: Skin is warm and dry.    Psychiatric: She has a normal mood and affect. Her behavior is normal. Thought content normal.          Assessment & Plan:

## 2014-02-02 NOTE — Progress Notes (Signed)
Pre visit review using our clinic review tool, if applicable. No additional management support is needed unless otherwise documented below in the visit note. 

## 2014-02-03 ENCOUNTER — Telehealth: Payer: Self-pay

## 2014-02-03 ENCOUNTER — Ambulatory Visit: Payer: 59

## 2014-02-03 DIAGNOSIS — R7309 Other abnormal glucose: Secondary | ICD-10-CM

## 2014-02-03 LAB — HEMOGLOBIN A1C: Hgb A1c MFr Bld: 5.9 % (ref 4.6–6.5)

## 2014-02-03 LAB — BASIC METABOLIC PANEL
BUN: 14 mg/dL (ref 6–23)
CHLORIDE: 107 meq/L (ref 96–112)
CO2: 23 meq/L (ref 19–32)
Calcium: 9.6 mg/dL (ref 8.4–10.5)
Creatinine, Ser: 0.9 mg/dL (ref 0.4–1.2)
GFR: 89.52 mL/min (ref >60.00)
GLUCOSE: 125 mg/dL — AB (ref 70–99)
POTASSIUM: 4.3 meq/L (ref 3.5–5.1)
Sodium: 138 mEq/L (ref 135–145)

## 2014-02-03 LAB — CBC WITH DIFFERENTIAL/PLATELET
Basophils Absolute: 0 10*3/uL (ref 0.0–0.1)
Basophils Relative: 0.1 % (ref 0.0–3.0)
Eosinophils Absolute: 0.3 10*3/uL (ref 0.0–0.7)
Eosinophils Relative: 2.3 % (ref 0.0–5.0)
HEMATOCRIT: 37.1 % (ref 36.0–46.0)
Hemoglobin: 11.8 g/dL — ABNORMAL LOW (ref 12.0–15.0)
LYMPHS ABS: 2.8 10*3/uL (ref 0.7–4.0)
Lymphocytes Relative: 23.1 % (ref 12.0–46.0)
MCHC: 31.9 g/dL (ref 30.0–36.0)
MCV: 85.3 fl (ref 78.0–100.0)
MONO ABS: 0.3 10*3/uL (ref 0.1–1.0)
Monocytes Relative: 2.7 % — ABNORMAL LOW (ref 3.0–12.0)
Neutro Abs: 8.8 10*3/uL — ABNORMAL HIGH (ref 1.4–7.7)
Neutrophils Relative %: 71.8 % (ref 43.0–77.0)
PLATELETS: 351 10*3/uL (ref 150.0–400.0)
RBC: 4.35 Mil/uL (ref 3.87–5.11)
RDW: 15.6 % — AB (ref 11.5–15.5)
WBC: 12.3 10*3/uL — ABNORMAL HIGH (ref 4.0–10.5)

## 2014-02-03 LAB — TROPONIN I

## 2014-02-03 LAB — TSH: TSH: 1.03 u[IU]/mL (ref 0.35–4.50)

## 2014-02-03 NOTE — Telephone Encounter (Signed)
i have no idea what this message is in reference to.  Pt was seen yesterday and there was no discussion of antibiotics or creams or an appt from a week ago (nothing in system that I can see).  Please call and speak w/ pt to get more information

## 2014-02-03 NOTE — Telephone Encounter (Signed)
Amber Salas [phone]  [name] called she is not any better, actually worse, a week ago she got an antibiotic nitrofurantoin 100 mg, she did not take it because it was discovered she needed a cream instead. Would it do her any good to take this or does she need something else.

## 2014-02-03 NOTE — Telephone Encounter (Signed)
Pt states that she discussed during the visit yesterday hx. allergies and possible sinus infection.  States symptoms are worse today than yesterday.  She has restarted Zyrtec and Flonase OTC as advised.  An acute appointment was offered.  She declined.  Therefore she was encouraged to try neti-pot and to drink plenty of fluids, and call back if symptoms fail to improve or worsen.

## 2014-02-05 NOTE — Assessment & Plan Note (Signed)
Deteriorated.  Pt stopped BP meds.  Start Losartan HCTZ due to edema and elevated BP w/ CP.  Check labs.  Will follow closely.

## 2014-02-05 NOTE — Assessment & Plan Note (Signed)
Pt is non-compliant w/ CPAP.  Has not used in over a month.  Discussed that this can elevate BP and cause HAs.  Stressed need for compliance.  Will follow.

## 2014-02-05 NOTE — Assessment & Plan Note (Addendum)
New to provider.  Hx of similar for pt.  EKG unchanged from previous.  Refer to cardiology given recurrent sxs.  Check troponin.  Restart BP meds.  Reviewed supportive care and red flags that should prompt return.  Pt expressed understanding and is in agreement w/ plan.

## 2014-02-05 NOTE — Assessment & Plan Note (Signed)
No evidence of infxn.  Restart daily OTC antihistamine.  Nasal steroid.  Reviewed supportive care and red flags that should prompt return.  Pt expressed understanding and is in agreement w/ plan.

## 2014-02-05 NOTE — Assessment & Plan Note (Signed)
Deteriorated.  Pt continues to gain weight.  Stressed need for healthy food choices and get regular exercise.  Will get labs to risk stratify.  Will follow closely.

## 2014-02-06 ENCOUNTER — Encounter: Payer: Self-pay | Admitting: General Practice

## 2014-03-03 ENCOUNTER — Encounter: Payer: Self-pay | Admitting: Family Medicine

## 2014-03-03 ENCOUNTER — Ambulatory Visit (INDEPENDENT_AMBULATORY_CARE_PROVIDER_SITE_OTHER): Payer: 59 | Admitting: Family Medicine

## 2014-03-03 VITALS — BP 146/80 | HR 100 | Temp 98.1°F | Resp 16 | Wt >= 6400 oz

## 2014-03-03 DIAGNOSIS — I10 Essential (primary) hypertension: Secondary | ICD-10-CM

## 2014-03-03 MED ORDER — HYDROCHLOROTHIAZIDE 12.5 MG PO TABS: 12.5000 mg | ORAL_TABLET | Freq: Every day | ORAL | Status: DC

## 2014-03-03 NOTE — Patient Instructions (Signed)
Follow up in 6-8 weeks to recheck BP and weight loss Start the HCTZ daily for BP and swelling Continue to drink plenty of fluids Keep up the good work on regular exercise- I'm so proud of you! Congrats on the healthy food choices Call with any questions or concerns Happy Early Birthday!!!

## 2014-03-03 NOTE — Progress Notes (Signed)
Pre visit review using our clinic review tool, if applicable. No additional management support is needed unless otherwise documented below in the visit note. 

## 2014-03-03 NOTE — Assessment & Plan Note (Signed)
Improved w/ pt's changes to diet and exercise.  Pt did not pick up medication due to cost.  Start HCTZ daily- script printed for pt to take to [facility].  Reviewed supportive care and red flags that should prompt return.  Pt expressed understanding and is in agreement w/ plan.

## 2014-03-03 NOTE — Progress Notes (Signed)
   Subjective:    Patient ID: Amber Salas, female    DOB: [birth year], 38 y.o.   MRN: [id number]  HPI HTN- pt's BP is better than previous but still mildly elevated.  Pt was unable to afford BP med b/c she had not met deductible.  Pt reports CP was due to her bra.  No SOB.  Recently started exercising- swim class every Saturday.  No edema.   Review of Systems For ROS see HPI     Objective:   Physical Exam  Vitals reviewed. Constitutional: She is oriented to person, place, and time. She appears well-developed and well-nourished. No distress.  obese  HENT:  Head: Normocephalic and atraumatic.  Eyes: Conjunctivae and EOM are normal. Pupils are equal, round, and reactive to light.  Neck: Normal range of motion. Neck supple. No thyromegaly present.  Cardiovascular: Normal rate, regular rhythm, normal heart sounds and intact distal pulses.   No murmur heard. Pulmonary/Chest: Effort normal and breath sounds normal. No respiratory distress.  Abdominal: Soft. She exhibits no distension. There is no tenderness.  Musculoskeletal: She exhibits no edema.  Lymphadenopathy:    She has no cervical adenopathy.  Neurological: She is alert and oriented to person, place, and time.  Skin: Skin is warm and dry.  Psychiatric: She has a normal mood and affect. Her behavior is normal.          Assessment & Plan:

## 2014-03-06 ENCOUNTER — Encounter: Payer: Self-pay | Admitting: *Deleted

## 2014-03-07 ENCOUNTER — Encounter (HOSPITAL_COMMUNITY)
Admission: EM | Disposition: A | Discharge status: Home / Self Care | Payer: Self-pay | Source: Home / Self Care | Attending: Cardiovascular Disease

## 2014-03-07 ENCOUNTER — Emergency Department (HOSPITAL_COMMUNITY): Payer: 59

## 2014-03-07 ENCOUNTER — Observation Stay (HOSPITAL_COMMUNITY)
Admission: EM | Admit: 2014-03-07 | Discharge: 2014-03-08 | Disposition: A | Discharge status: Home / Self Care | Payer: 59 | Attending: Cardiovascular Disease | Admitting: Cardiovascular Disease

## 2014-03-07 ENCOUNTER — Ambulatory Visit (INDEPENDENT_AMBULATORY_CARE_PROVIDER_SITE_OTHER): Payer: 59 | Admitting: Cardiology

## 2014-03-07 ENCOUNTER — Encounter: Payer: Self-pay | Admitting: Cardiology

## 2014-03-07 ENCOUNTER — Encounter (HOSPITAL_COMMUNITY): Payer: Self-pay | Admitting: Emergency Medicine

## 2014-03-07 VITALS — BP 126/98 | HR 106 | Ht 66.75 in | Wt >= 6400 oz

## 2014-03-07 DIAGNOSIS — Z79899 Other long term (current) drug therapy: Secondary | ICD-10-CM | POA: Diagnosis not present

## 2014-03-07 DIAGNOSIS — I10 Essential (primary) hypertension: Secondary | ICD-10-CM | POA: Diagnosis not present

## 2014-03-07 DIAGNOSIS — Z6841 Body Mass Index (BMI) 40.0 and over, adult: Secondary | ICD-10-CM | POA: Insufficient documentation

## 2014-03-07 DIAGNOSIS — Z791 Long term (current) use of non-steroidal anti-inflammatories (NSAID): Secondary | ICD-10-CM | POA: Insufficient documentation

## 2014-03-07 DIAGNOSIS — K219 Gastro-esophageal reflux disease without esophagitis: Secondary | ICD-10-CM | POA: Diagnosis present

## 2014-03-07 DIAGNOSIS — E669 Obesity, unspecified: Secondary | ICD-10-CM | POA: Diagnosis present

## 2014-03-07 DIAGNOSIS — R9431 Abnormal electrocardiogram [ECG] [EKG]: Secondary | ICD-10-CM

## 2014-03-07 DIAGNOSIS — J45909 Unspecified asthma, uncomplicated: Secondary | ICD-10-CM | POA: Diagnosis not present

## 2014-03-07 DIAGNOSIS — R079 Chest pain, unspecified: Principal | ICD-10-CM | POA: Diagnosis present

## 2014-03-07 DIAGNOSIS — R011 Cardiac murmur, unspecified: Secondary | ICD-10-CM

## 2014-03-07 DIAGNOSIS — I200 Unstable angina: Secondary | ICD-10-CM

## 2014-03-07 DIAGNOSIS — G4733 Obstructive sleep apnea (adult) (pediatric): Secondary | ICD-10-CM | POA: Diagnosis present

## 2014-03-07 DIAGNOSIS — Z7951 Long term (current) use of inhaled steroids: Secondary | ICD-10-CM | POA: Insufficient documentation

## 2014-03-07 DIAGNOSIS — E668 Other obesity: Secondary | ICD-10-CM

## 2014-03-07 HISTORY — PX: LEFT HEART CATHETERIZATION WITH CORONARY ANGIOGRAM: SHX5451

## 2014-03-07 HISTORY — DX: Other chest pain: R07.89

## 2014-03-07 HISTORY — DX: Obesity, unspecified: E66.9

## 2014-03-07 HISTORY — DX: Other obesity: E66.8

## 2014-03-07 LAB — TSH: TSH: 2.32 u[IU]/mL (ref 0.350–4.500)

## 2014-03-07 LAB — BASIC METABOLIC PANEL
Anion gap: 10 (ref 5–15)
BUN: 10 mg/dL (ref 6–23)
CO2: 27 meq/L (ref 19–32)
CREATININE: 0.78 mg/dL (ref 0.50–1.10)
Calcium: 9.2 mg/dL (ref 8.4–10.5)
Chloride: 103 mEq/L (ref 96–112)
GFR calc Af Amer: >90 mL/min (ref >90)
GFR calc non Af Amer: >90 mL/min (ref >90)
GLUCOSE: 93 mg/dL (ref 70–99)
Potassium: 4 mEq/L (ref 3.7–5.3)
SODIUM: 140 meq/L (ref 137–147)

## 2014-03-07 LAB — PRO B NATRIURETIC PEPTIDE: PRO B NATRI PEPTIDE: 30.6 pg/mL (ref 0–125)

## 2014-03-07 LAB — PROTIME-INR
INR: 1.13 (ref 0.00–1.49)
Prothrombin Time: 14.6 seconds (ref 11.6–15.2)

## 2014-03-07 LAB — CBC
HCT: 39.7 % (ref 36.0–46.0)
HEMOGLOBIN: 13 g/dL (ref 12.0–15.0)
MCH: 27.7 pg (ref 26.0–34.0)
MCHC: 32.7 g/dL (ref 30.0–36.0)
MCV: 84.5 fL (ref 78.0–100.0)
Platelets: 240 10*3/uL (ref 150–400)
RBC: 4.7 MIL/uL (ref 3.87–5.11)
RDW: 14.9 % (ref 11.5–15.5)
WBC: 6.2 10*3/uL (ref 4.0–10.5)

## 2014-03-07 LAB — MAGNESIUM: MAGNESIUM: 1.9 mg/dL (ref 1.5–2.5)

## 2014-03-07 LAB — TROPONIN I: Troponin I: <0.3 ng/mL (ref <0.30)

## 2014-03-07 LAB — I-STAT TROPONIN, ED: Troponin i, poc: 0.01 ng/mL (ref 0.00–0.08)

## 2014-03-07 SURGERY — LEFT HEART CATHETERIZATION WITH CORONARY ANGIOGRAM
Anesthesia: LOCAL

## 2014-03-07 MED ORDER — ASPIRIN 81 MG PO CHEW: 81.0000 mg | CHEWABLE_TABLET | ORAL | Status: DC

## 2014-03-07 MED ORDER — ACETAMINOPHEN 325 MG PO TABS
650.0000 mg | ORAL_TABLET | ORAL | Status: DC | PRN
  Administered 2014-03-07 – 2014-03-08 (×2): 650 mg via ORAL
  Filled 2014-03-07 (×2): qty 2

## 2014-03-07 MED ORDER — ASPIRIN EC 81 MG PO TBEC
325.0000 mg | DELAYED_RELEASE_TABLET | Freq: Once | ORAL | Status: AC
  Administered 2014-03-07: 325 mg via ORAL

## 2014-03-07 MED ORDER — NITROGLYCERIN 1 MG/10 ML FOR IR/CATH LAB
INTRA_ARTERIAL | Status: AC
  Filled 2014-03-07: qty 10

## 2014-03-07 MED ORDER — FLUTICASONE PROPIONATE HFA 44 MCG/ACT IN AERO
2.0000 | INHALATION_SPRAY | Freq: Two times a day (BID) | RESPIRATORY_TRACT | Status: DC
  Administered 2014-03-07 – 2014-03-08 (×2): 2 via RESPIRATORY_TRACT
  Filled 2014-03-07: qty 10.6

## 2014-03-07 MED ORDER — ASPIRIN EC 325 MG PO TBEC: 325.0000 mg | DELAYED_RELEASE_TABLET | Freq: Every day | ORAL | Status: DC

## 2014-03-07 MED ORDER — SODIUM CHLORIDE 0.9 % IJ SOLN: 3.0000 mL | Freq: Two times a day (BID) | INTRAMUSCULAR | Status: DC

## 2014-03-07 MED ORDER — HEPARIN (PORCINE) IN NACL 100-0.45 UNIT/ML-% IJ SOLN
1400.0000 [IU]/h | INTRAMUSCULAR | Status: DC
  Administered 2014-03-07: 1400 [IU]/h via INTRAVENOUS
  Filled 2014-03-07 (×2): qty 250

## 2014-03-07 MED ORDER — SODIUM CHLORIDE 0.9 % IV SOLN: 250.0000 mL | INTRAVENOUS | Status: DC | PRN

## 2014-03-07 MED ORDER — NITROGLYCERIN 0.4 MG SL SUBL
0.4000 mg | SUBLINGUAL_TABLET | SUBLINGUAL | Status: AC
  Administered 2014-03-07: 0.4 mg via SUBLINGUAL

## 2014-03-07 MED ORDER — ONDANSETRON HCL 4 MG/2ML IJ SOLN
4.0000 mg | Freq: Four times a day (QID) | INTRAMUSCULAR | Status: DC | PRN
Start: 2014-03-07 — End: 2014-03-08

## 2014-03-07 MED ORDER — ALBUTEROL SULFATE HFA 108 (90 BASE) MCG/ACT IN AERS: 2.0000 | INHALATION_SPRAY | RESPIRATORY_TRACT | Status: DC | PRN

## 2014-03-07 MED ORDER — LIDOCAINE HCL (PF) 1 % IJ SOLN
INTRAMUSCULAR | Status: AC
  Filled 2014-03-07: qty 30

## 2014-03-07 MED ORDER — ASPIRIN EC 81 MG PO TBEC
81.0000 mg | DELAYED_RELEASE_TABLET | Freq: Every day | ORAL | Status: DC
  Filled 2014-03-07: qty 1

## 2014-03-07 MED ORDER — DILTIAZEM HCL ER COATED BEADS 120 MG PO CP24
120.0000 mg | ORAL_CAPSULE | Freq: Every day | ORAL | Status: DC
  Filled 2014-03-07: qty 1

## 2014-03-07 MED ORDER — LORATADINE 10 MG PO TABS
10.0000 mg | ORAL_TABLET | Freq: Every day | ORAL | Status: DC
  Filled 2014-03-07: qty 1

## 2014-03-07 MED ORDER — VERAPAMIL HCL 2.5 MG/ML IV SOLN
INTRAVENOUS | Status: AC
  Filled 2014-03-07: qty 2

## 2014-03-07 MED ORDER — HEPARIN BOLUS VIA INFUSION
4000.0000 [IU] | Freq: Once | INTRAVENOUS | Status: AC
  Administered 2014-03-07: 4000 [IU] via INTRAVENOUS
  Filled 2014-03-07: qty 4000

## 2014-03-07 MED ORDER — HEPARIN (PORCINE) IN NACL 2-0.9 UNIT/ML-% IJ SOLN
INTRAMUSCULAR | Status: AC
  Filled 2014-03-07: qty 1000

## 2014-03-07 MED ORDER — ACETAMINOPHEN 325 MG PO TABS: 650.0000 mg | ORAL_TABLET | ORAL | Status: DC | PRN

## 2014-03-07 MED ORDER — ATORVASTATIN CALCIUM 40 MG PO TABS
40.0000 mg | ORAL_TABLET | ORAL | Status: DC
  Filled 2014-03-07 (×2): qty 1

## 2014-03-07 MED ORDER — DILTIAZEM HCL ER COATED BEADS 120 MG PO CP24
120.0000 mg | ORAL_CAPSULE | ORAL | Status: AC
  Administered 2014-03-07: 21:00:00 120 mg via ORAL
  Filled 2014-03-07: qty 1

## 2014-03-07 MED ORDER — SODIUM CHLORIDE 0.9 % IV SOLN: INTRAVENOUS | Status: AC

## 2014-03-07 MED ORDER — NITROGLYCERIN 0.4 MG SL SUBL: 0.4000 mg | SUBLINGUAL_TABLET | SUBLINGUAL | Status: DC | PRN

## 2014-03-07 MED ORDER — SODIUM CHLORIDE 0.9 % IJ SOLN
3.0000 mL | INTRAMUSCULAR | Status: DC | PRN
Start: 2014-03-07 — End: 2014-03-08

## 2014-03-07 MED ORDER — ATORVASTATIN CALCIUM 40 MG PO TABS
40.0000 mg | ORAL_TABLET | Freq: Every day | ORAL | Status: DC
  Filled 2014-03-07: qty 1

## 2014-03-07 MED ORDER — MIDAZOLAM HCL 2 MG/2ML IJ SOLN
INTRAMUSCULAR | Status: AC
  Filled 2014-03-07: qty 2

## 2014-03-07 MED ORDER — ONDANSETRON HCL 4 MG/2ML IJ SOLN
4.0000 mg | Freq: Four times a day (QID) | INTRAMUSCULAR | Status: DC | PRN
Start: 2014-03-07 — End: 2014-03-07

## 2014-03-07 MED ORDER — SODIUM CHLORIDE 0.9 % IJ SOLN: 3.0000 mL | INTRAMUSCULAR | Status: DC | PRN

## 2014-03-07 MED ORDER — FENTANYL CITRATE 0.05 MG/ML IJ SOLN
INTRAMUSCULAR | Status: AC
  Filled 2014-03-07: qty 2

## 2014-03-07 MED ORDER — HEPARIN (PORCINE) IN NACL 2-0.9 UNIT/ML-% IJ SOLN
INTRAMUSCULAR | Status: AC
  Filled 2014-03-07: qty 1500

## 2014-03-07 MED ORDER — ASPIRIN 300 MG RE SUPP: 300.0000 mg | RECTAL | Status: DC

## 2014-03-07 MED ORDER — SODIUM CHLORIDE 0.9 % IV SOLN: 1.0000 mL/kg/h | INTRAVENOUS | Status: DC

## 2014-03-07 MED ORDER — ASPIRIN 81 MG PO CHEW: 324.0000 mg | CHEWABLE_TABLET | ORAL | Status: DC

## 2014-03-07 NOTE — ED Notes (Signed)
Dr. Ward at the bedside.  

## 2014-03-07 NOTE — H&P (Signed)
Lars MassonELSON, Heberto Sturdevant H 03/07/2014

## 2014-03-07 NOTE — CV Procedure (Signed)
    Cardiac Catheterization Procedure Note  Name: Amber NgYolonda Y Salas MRN: [id number] DOB: 03/05/1976  Procedure: Left Heart Cath, Selective Coronary Angiography, LV angiography  Indication: Chest pain concerning for unstable angina. Patient with body habitus unsuitable for stress testing (> 400#).   Procedural Details: The right wrist was prepped, draped, and anesthetized with 1% lidocaine. Using the modified Seldinger technique, a 5/6 French Slender sheath was introduced into the right radial artery. 3 mg of verapamil was administered through the sheath, weight-based unfractionated heparin was administered intravenously. Standard Judkins catheters were used for selective coronary angiography and left ventriculography. Catheter exchanges were performed over an exchange length guidewire. There were no immediate procedural complications. A TR band was used for radial hemostasis at the completion of the procedure.  The patient was transferred to the post catheterization recovery area for further monitoring.  Procedural Findings: Hemodynamics: AO 117/74 LV 121/6  Coronary angiography: Coronary dominance: right  Left mainstem: Widely patent without obstructive disease. Arises from the left coronary cusp. Divides into the LAD and left circumflex.  Left anterior descending (LAD): Patent to the LV apex. The diagonal branches are patent without obstruction.  Left circumflex (LCx): Large-caliber vessel. The first and second OM's are small and medium in caliber, respectively. The third obtuse marginal was larger in caliber. There is no obstructive disease noted.  Right coronary artery (RCA): Dominant vessel. There may be mild catheter-induced vasospasm of the proximal vessel. There is no significant obstruction noted. The proximal, mid, and distal RCA are patent. The PDA is patent.  Left ventriculography: Left ventricular systolic function is normal, LVEF is estimated at 65-70%, there is no  significant mitral regurgitation   Estimated Blood Loss: Minimal  Final Conclusions:   1. Patent coronary arteries without obstructive CAD 2. Normal LV systolic function and normal LVEDP  Recommendations: Suspect noncardiac chest pain. Anticipate discharge home tomorrow morning.  Tonny BollmanMichael Calloway Andrus MD, [facility] 03/07/2014, 6:06 PM

## 2014-03-07 NOTE — ED Notes (Signed)
Floor RN unable to take report at this time.

## 2014-03-07 NOTE — ED Notes (Signed)
EMS - Pt was being seen today by Corinda GublerLebauer (Dr. Delton SeeNelson) because PCP noted EKG changes from a annual physical.  During visit today, pt had a period of 15 minutes of left chest heaviness and 8/10 pain.  Pt given 1 Nitro and 324mg  Aspirin.  Pt states the pain has been intermittent x 1 month and was the worse today.  Denies N/V/D.  Had associated light headed, dizzy, back Pain and SOB.    Pt states the pain in the ED is 0/10.

## 2014-03-07 NOTE — Progress Notes (Signed)
ANTICOAGULATION CONSULT NOTE - Initial Consult  Pharmacy Consult for heparin Indication: chest pain/ACS  Allergies  Allergen Reactions  . Amlodipine     Hands turn red  . Peppermint Oil     asthma  . Fluticasone-Salmeterol Palpitations and Other (See Comments)    chest pain  . Latex Rash and Other (See Comments)    wheezing  . Spinach Rash    Patient Measurements: Height: 5\' 6"  (167.6 cm) Weight: 427 lb (193.686 kg) IBW/kg (Calculated) : 59.3 Heparin Dosing Weight: 110kg  Vital Signs: Temp: 98 F (36.7 C) (10/27 1315) Temp Source: Oral (10/27 1315) BP: 137/71 mmHg (10/27 1315) Pulse Rate: 88 (10/27 1315)  Labs: No results found for this basename: HGB, HCT, PLT, APTT, LABPROT, INR, HEPARINUNFRC, CREATININE, CKTOTAL, CKMB, TROPONINI,  in the last 72 hours  Estimated Creatinine Clearance: 154.3 ml/min (by C-G formula based on Cr of 0.9).   Medical History: Past Medical History  Diagnosis Date  . Asthma     history of  . Murmur, cardiac   . Retinal hole or tear     left eye- 1996 due to injury  . Obstructive sleep apnea on CPAP   . Morbid obesity   . Arrhythmia   . OSA (obstructive sleep apnea) 08/30/2010  . GERD (gastroesophageal reflux disease)   . H/O hiatal hernia   . Headache(784.0)     migraines  . Hypertension     Medications:  Infusions:  . heparin    . heparin      Assessment: 38 yo morbidly obese female presented to the ED from PCP office d/t EKG changes and CP. To start IV heparin for anticoagulation. Baseline labs are pending but have been relatively normal in the past. She is not on any anticoagulation PTA.   Goal of Therapy:  Heparin level 0.3-0.7 units/ml Monitor platelets by anticoagulation protocol: Yes   Plan:  1. Heparin bolus 4000 units IV x 1 2. Heparin gtt 1400 units/hr 3. Check a 6 hour heparin level 4. Daily heparin level and CBC  Tao Satz, Drake Leachachel Lynn 03/07/2014,1:52 PM

## 2014-03-07 NOTE — Patient Instructions (Signed)
PT TRANSPORTED EMERGENCY TRAFFIC FOR UNSTABLE ANGINA   ADMINISTERED 0.4 MG NITROGLYCERIN SL   ADMINISTERED 4 TABS -81 MG BABY ASPRIN   TRISH NOTIFIED BY DR Delton SeeNELSON

## 2014-03-07 NOTE — H&P (View-Only) (Signed)
Patient ID: Amber NgYolonda Y Lease, female   DOB: 08/24/1975, 38 y.o.   MRN: [id number]      HISTORY AND PHYSICAL  Patient Name: Amber Salas Date of Encounter: 03/07/2014  Primary Care Provider:  Neena RhymesKatherine Tabori, MD Primary Cardiologist:  Lars MassonNELSON, Thana Ramp H  Problem List   Past Medical History  Diagnosis Date  . Asthma     history of  . Murmur, cardiac   . Retinal hole or tear     left eye- 1996 due to injury  . Obstructive sleep apnea on CPAP   . Morbid obesity   . Arrhythmia   . OSA (obstructive sleep apnea) 08/30/2010  . GERD (gastroesophageal reflux disease)   . H/O hiatal hernia   . Headache(784.0)     migraines  . Hypertension    Past Surgical History  Procedure Laterality Date  . Cesarean section  4/04  . Hernia repair  04/2009    abdominal hernia  . Wisdom tooth extraction  1999  . Eye surgery  11/2010    lasar repair of retinal tear  . Tubal ligation    . Esophagogastroduodenoscopy  12/30/2011    Procedure: ESOPHAGOGASTRODUODENOSCOPY (EGD);  Surgeon: [name] DareMalcolm T Stark, MD,FACG;  [facility];  Service: Endoscopy;  Laterality: N/A;  . Laparoscopic total hysterectomy  03/17/13  . Abdominal hysterectomy      Allergies  Allergies  Allergen Reactions  . Amlodipine     Hands turn red  . Peppermint Oil     asthma  . Fluticasone-Salmeterol Palpitations and Other (See Comments)    chest pain  . Latex Rash and Other (See Comments)    wheezing  . Spinach Rash    HPI  A pleasant, morbidly obese 38 year old female with prior medical history of hypertension, asthma, was been experiencing exertional chest pains associated with shortness of breath dizziness and nausea. This has started these year when she presented to the ER was rule out for ACS pulmonary embolism and discharge home. The patient was recently diagnosed with hypertension and started on hydrochlorothiazide. She has stated that her chest pain has been progressively getting worse currently  okay during at work when she is just sitting behind a computer. Her grandfather had myocardial infarction she doesn't know the age. Her father died for unknown reason. She also has lower extremity edema and dyspnea on exertion. No syncope. She has never smoked, maternal grandmother dies of myocardial infarction at age 37, great grand mother at age of 11041. She is post hysterectomy with preserved ovaries. The patient developed acute chest pain while being in the clinic with shortness of breath and dizziness, pain 8 out of 10 with it with sublingual nitroglycerin in 1:50 out of 10. EKG in the clinic shows 0.5 mm ST elevations in the lead to aVF or in V5. There is also some mild diffuse PR segment depression.  Home Medications  Prior to Admission medications   Medication Sig Start Date End Date Taking? Authorizing Provider  albuterol (PROAIR HFA) 108 (90 BASE) MCG/ACT inhaler Inhale 2 puffs into the lungs every 4 (four) hours as needed for wheezing or shortness of breath. 01/14/13  Yes Sandford CrazeMelissa O'Sullivan, NP  albuterol (PROVENTIL) (2.5 MG/3ML) 0.083% nebulizer solution Take 3 mLs (2.5 mg total) by nebulization every 4 (four) hours as needed. For shortness of breath or wheezing 01/14/13  Yes Sandford CrazeMelissa O'Sullivan, NP  beclomethasone (QVAR) 40 MCG/ACT inhaler Inhale 2 puffs into the lungs 2 (two) times daily. Take 2 puffs first thing in the  Am and then another 2 puffs about 12 hours later. 02/09/13  Yes Sandford Craze, NP  cetirizine (ZYRTEC) 10 MG tablet Take 10 mg by mouth daily.   Yes Historical Provider, MD  ibuprofen (ADVIL,MOTRIN) 200 MG tablet Take 200 mg by mouth once as needed. For pain--take as directed on bottle   Yes Historical Provider, MD  Multiple Vitamin (MULITIVITAMIN WITH MINERALS) TABS Take 1 tablet by mouth daily.   Yes Historical Provider, MD  pantoprazole (PROTONIX) 40 MG tablet Take 1 tablet (40 mg total) by mouth daily. Take 30- 60 min before your first meal of the day 12/17/12  Yes Kalman Shan, MD  hydrochlorothiazide (MICROZIDE) 12.5 MG capsule Take 12.5 mg by mouth daily.    Historical Provider, MD    Family History  Family History  Problem Relation Age of Onset  . Hypertension Mother   . Diabetes type II Mother   . Other Mother     cervical dysplasia  . Ovarian cancer Mother   . Hypertension Father   . Bipolar disorder Father   . Heart disease Maternal Grandmother   . Hypertension Maternal Grandmother   . Bipolar disorder Maternal Grandmother   . Hypertension Maternal Grandfather   . Hypertension Paternal Grandmother   . Hypertension Paternal Grandfather   . Hypertension Sister   . Heart attack Neg Hx   . Hyperlipidemia Neg Hx   . Sudden death Neg Hx     Social History  History   Social History  . Marital Status: Divorced    Spouse Name: N/A    Number of Children: 3  . Years of Education: N/A   Occupational History  . CUSTOMER [employer]   Social History Main Topics  . Smoking status: Never Smoker   . Smokeless tobacco: Never Used  . Alcohol Use: No  . Drug Use: No  . Sexual Activity: Yes    Birth Control/ Protection: Surgical   Other Topics Concern  . Not on file   Social History Narrative   Regular exercise:  Yes   Works in Clinical biochemist at [employer] [place]   Nearing end of a divorce- feels relieved.     Review of Systems, as per HPI, otherwise negative General:  No chills, fever, night sweats or weight changes.  Cardiovascular:  No chest pain, dyspnea on exertion, edema, orthopnea, palpitations, paroxysmal nocturnal dyspnea. Dermatological: No rash, lesions/masses Respiratory: No cough, dyspnea Urologic: No hematuria, dysuria Abdominal:   No nausea, vomiting, diarrhea, bright red blood per rectum, melena, or hematemesis Neurologic:  No visual changes, wkns, changes in mental status. All other systems reviewed and are otherwise negative except as noted above.  Physical Exam  Blood pressure 126/98, pulse 106,  height 5' 6.75" (1.695 m), weight 427 lb (193.686 kg), last menstrual period 06/27/2012, SpO2 97.00%.  General: Pleasant, in acute distress due to chest pain. Psych: Normal affect. Neuro: Alert and oriented X 3. Moves all extremities spontaneously. HEENT: Normal  Neck: Supple without bruits or JVD. Lungs:  Resp regular and unlabored, CTA. Heart: RRR no s3, s4, 3/6 systolic murmur. Abdomen: Soft, non-tender, non-distended, BS + x 4.  Extremities: No clubbing, cyanosis, mild chronic edema, DP/PT/Radials 2+ and equal bilaterally.  Labs:  No results found for this basename: CKTOTAL, CKMB, TROPONINI,  in the last 72 hours Lab Results  Component Value Date   WBC 12.3* 02/02/2014   HGB 11.8* 02/02/2014   HCT 37.1 02/02/2014   MCV 85.3 02/02/2014   PLT  351.0 02/02/2014    Lab Results  Component Value Date   DDIMER 0.68* 07/12/2013   No components found with this basename: POCBNP,     Component Value Date/Time   NA 138 02/02/2014 1608   K 4.3 02/02/2014 1608   CL 107 02/02/2014 1608   CO2 23 02/02/2014 1608   GLUCOSE 125* 02/02/2014 1608   BUN 14 02/02/2014 1608   CREATININE 0.9 02/02/2014 1608   CREATININE 0.78 07/04/2013 1034   CALCIUM 9.6 02/02/2014 1608   PROT 7.6 08/08/2013 0937   ALBUMIN 3.2* 08/08/2013 0937   AST 16 08/08/2013 0937   ALT 16 08/08/2013 0937   ALKPHOS 83 08/08/2013 0937   BILITOT 0.3 08/08/2013 0937   GFRNONAA >90 08/08/2013 0937   GFRNONAA >89 07/04/2013 1034   GFRAA >90 08/08/2013 0937   GFRAA >89 07/04/2013 1034   Lab Results  Component Value Date   CHOL 118 07/04/2013   HDL 51 07/04/2013   LDLCALC 50 07/04/2013   TRIG 87 07/04/2013   Accessory Clinical Findings  Echocardiogram - none  ECG - SR, prior anterior MI, negative T waves in the anterolateral leads    Assessment & Plan  38 year old female  1. Typical exertional chest - unstable angina with progressively worsening symptoms, currently also at rest, we will add ASA 81 mg po daily, no betablockers - asthma  on daily inhalors, add Cardizem CD 120 mg po daily.  She is having ongoing pain relieved by nitroglycerin with minimal 0.5 mm ST elevation in the inferolateral leads, we will transfer to the hospital for urgent cardiac catheterization, doesn't qualify for STEMI but concerning, baseline ECG showed inefrolateral negative T waves. The patient was given for aspirins total of 324 mg in the clinic. She is currently chest pain-free. She'll need to go to the Cath Lab as soon as possible, differential includes acute coronary syndrome versus possible acute pericarditis based on her EKG even though her presentation doesn't fit the picture at all. Start Heparin iv once in the hospital.  Cultural troponin once patient admitted, she has recent lab from last month with normal kidney function.  2. Hypertension - we will add Cardizem CD 120 mg daily.  3. Lipids - not check, we will start atorvastatin 40 mg po daily.  4. Systolic murmur - since birth, we will check echocardiogram   Lars MassonNELSON, Babetta Paterson H, MD, [facility] 03/07/2014, 11:48 AM

## 2014-03-07 NOTE — H&P (Signed)
     Patient ID: Amber Salas MRN: [id number], DOB/AGE: 37/11/1975   Admit date: 03/07/2014   Primary Physician: Neena RhymesKatherine Tabori, MD Primary Cardiologist: Tobias AlexanderKatarina Nelson  Briefly, patient admitted from the office today for chest pain and ECG changes. Please see HPI by Dr. Tobias AlexanderKatarina Nelson from earlier today.   Plan for LHC later this afternoon.   Billy FischerSigned, Danese Dorsainvil R, PA-C 03/07/2014, 2:17 PM  Pager [phone]

## 2014-03-07 NOTE — Interval H&P Note (Signed)
History and Physical Interval Note:  03/07/2014 5:31 PM  Anne NgYolonda Y Wildrick  has presented today for surgery, with the diagnosis of cp  The various methods of treatment have been discussed with the patient and family. After consideration of risks, benefits and other options for treatment, the patient has consented to  Procedure(s): LEFT HEART CATHETERIZATION WITH CORONARY ANGIOGRAM (N/A) as a surgical intervention .  The patient's history has been reviewed, patient examined, no change in status, stable for surgery.  I have reviewed the patient's chart and labs.  Questions were answered to the patient's satisfaction.     Tonny BollmanMichael Maleigh Bagot

## 2014-03-07 NOTE — ED Notes (Signed)
Talked to cath lab. Pt will not be able to go to unit assigned after cath, attempted to call patient placement. No answer, will continue to contact.

## 2014-03-07 NOTE — ED Provider Notes (Signed)
TIME SEEN: 2:25 PM  CHIEF COMPLAINT: chest pain  HPI: Pt is a 38 y.o. F the history of asthma, obesity, obstructive sleep apnea, hypertension who was seen today by cardiology and was instructed to come to the hospital for concerns for EKG changes in chest pain. She reports she has had intermittent episodes of chest pressure with associated shortness of breath, nausea, diaphoresis. No fevers or cough. No prior history of coronary artery disease, PE or DVT. No lower extremity swelling or pain.  ROS: See HPI Constitutional: no fever  Eyes: no drainage  ENT: no runny nose   Cardiovascular:   chest pain  Resp: no SOB  GI: no vomiting GU: no dysuria Integumentary: no rash  Allergy: no hives  Musculoskeletal: no leg swelling  Neurological: no slurred speech ROS otherwise negative  PAST MEDICAL HISTORY/PAST SURGICAL HISTORY:  Past Medical History  Diagnosis Date  . Asthma     history of  . Murmur, cardiac   . Retinal hole or tear     left eye- 1996 due to injury  . Obstructive sleep apnea on CPAP   . Morbid obesity   . Arrhythmia   . OSA (obstructive sleep apnea) 08/30/2010  . GERD (gastroesophageal reflux disease)   . H/O hiatal hernia   . Headache(784.0)     migraines  . Hypertension     MEDICATIONS:  Prior to Admission medications   Medication Sig Start Date End Date Taking? Authorizing Provider  albuterol (PROAIR HFA) 108 (90 BASE) MCG/ACT inhaler Inhale 2 puffs into the lungs every 4 (four) hours as needed for wheezing or shortness of breath. 01/14/13  Yes Sandford CrazeMelissa O'Sullivan, NP  albuterol (PROVENTIL) (2.5 MG/3ML) 0.083% nebulizer solution Take 3 mLs (2.5 mg total) by nebulization every 4 (four) hours as needed. For shortness of breath or wheezing 01/14/13  Yes Sandford CrazeMelissa O'Sullivan, NP  beclomethasone (QVAR) 40 MCG/ACT inhaler Inhale 2 puffs into the lungs 2 (two) times daily. Take 2 puffs first thing in the Am and then another 2 puffs about 12 hours later. 02/09/13  Yes Sandford CrazeMelissa  O'Sullivan, NP  cetirizine (ZYRTEC) 10 MG tablet Take 10 mg by mouth daily.   Yes Historical Provider, MD  ibuprofen (ADVIL,MOTRIN) 200 MG tablet Take 200 mg by mouth every 8 (eight) hours as needed for mild pain.    Yes Historical Provider, MD  Multiple Vitamin (MULITIVITAMIN WITH MINERALS) TABS Take 1 tablet by mouth daily.   Yes Historical Provider, MD  tetrahydrozoline-zinc (VISINE-AC) 0.05-0.25 % ophthalmic solution Place 2 drops into both eyes 3 (three) times daily as needed (dry itchy eyes).   Yes Historical Provider, MD    ALLERGIES:  Allergies  Allergen Reactions  . Amlodipine     Hands turn red  . Peppermint Oil     asthma  . Fluticasone-Salmeterol Palpitations and Other (See Comments)    chest pain  . Latex Rash and Other (See Comments)    wheezing  . Spinach Rash    SOCIAL HISTORY:  History  Substance Use Topics  . Smoking status: Never Smoker   . Smokeless tobacco: Never Used  . Alcohol Use: No    FAMILY HISTORY: Family History  Problem Relation Age of Onset  . Hypertension Mother   . Diabetes type II Mother   . Other Mother     cervical dysplasia  . Ovarian cancer Mother   . Hypertension Father   . Bipolar disorder Father   . Heart disease Maternal Grandmother   . Hypertension Maternal Grandmother   .  Bipolar disorder Maternal Grandmother   . Hypertension Maternal Grandfather   . Hypertension Paternal Grandmother   . Hypertension Paternal Grandfather   . Hypertension Sister   . Heart attack Neg Hx   . Hyperlipidemia Neg Hx   . Sudden death Neg Hx     EXAM: BP 130/82  Pulse 88  Temp(Src) 98 F (36.7 C) (Oral)  Resp 21  Ht 5\' 6"  (1.676 m)  Wt 427 lb (193.686 kg)  BMI 68.95 kg/m2  SpO2 100%  LMP 06/27/2012 CONSTITUTIONAL: Alert and oriented and responds appropriately to questions. Well-appearing; well-nourished HEAD: Normocephalic EYES: Conjunctivae clear, PERRL ENT: normal nose; no rhinorrhea; moist mucous membranes; pharynx without lesions  noted NECK: Supple, no meningismus, no LAD  CARD: RRR; S1 and S2 appreciated; no murmurs, no clicks, no rubs, no gallops RESP: Normal chest excursion without splinting or tachypnea; breath sounds clear and equal bilaterally; no wheezes, no rhonchi, no rales,  ABD/GI: Normal bowel sounds; non-distended; soft, non-tender, no rebound, no guarding BACK:  The back appears normal and is non-tender to palpation, there is no CVA tenderness EXT: Normal ROM in all joints; non-tender to palpation; no edema; normal capillary refill; no cyanosis    SKIN: Normal color for age and race; warm NEURO: Moves all extremities equally PSYCH: The patient's mood and manner are appropriate. Grooming and personal hygiene are appropriate.  MEDICAL DECISION MAKING: Patient here with concerning story for ACS. She has already been seen by cardiology and they plan to admit the patient and take her for cardiac catheterization. Will start heparin.  Patient is hemodynamically stable. She agrees with plan.     EKG Interpretation  Date/Time:  Tuesday March 07 2014 13:07:08 EDT Ventricular Rate:  92 PR Interval:  183 QRS Duration: 78 QT Interval:  359 QTC Calculation: 444 R Axis:   28 Text Interpretation:  Sinus rhythm Anteroseptal infarct, old Nonspecific T abnormalities, inferior leads No significant change since last tracing Confirmed by WARD,  DO, KRISTEN ([id number]) on 03/07/2014 1:29:52 PM        CRITICAL CARE Performed by: Raelyn NumberWARD, KRISTEN N   Total critical care time: 30 minutes  Critical care time was exclusive of separately billable procedures and treating other patients.  Critical care was necessary to treat or prevent imminent or life-threatening deterioration.  Critical care was time spent personally by me on the following activities: development of treatment plan with patient and/or surrogate as well as nursing, discussions with consultants, evaluation of patient's response to treatment, examination of  patient, obtaining history from patient or surrogate, ordering and performing treatments and interventions, ordering and review of laboratory studies, ordering and review of radiographic studies, pulse oximetry and re-evaluation of patient's condition.       Layla MawKristen N Ward, DO 03/09/14 1229

## 2014-03-07 NOTE — Progress Notes (Signed)
Patient ID: Amber Salas, female   DOB: 08/24/1975, 38 y.o.   MRN: [id number]      HISTORY AND PHYSICAL  Patient Name: Amber Salas Date of Encounter: 03/07/2014  Primary Care Provider:  Neena RhymesKatherine Tabori, MD Primary Cardiologist:  Lars MassonNELSON, Shizuko Wojdyla H  Problem List   Past Medical History  Diagnosis Date  . Asthma     history of  . Murmur, cardiac   . Retinal hole or tear     left eye- 1996 due to injury  . Obstructive sleep apnea on CPAP   . Morbid obesity   . Arrhythmia   . OSA (obstructive sleep apnea) 08/30/2010  . GERD (gastroesophageal reflux disease)   . H/O hiatal hernia   . Headache(784.0)     migraines  . Hypertension    Past Surgical History  Procedure Laterality Date  . Cesarean section  4/04  . Hernia repair  04/2009    abdominal hernia  . Wisdom tooth extraction  1999  . Eye surgery  11/2010    lasar repair of retinal tear  . Tubal ligation    . Esophagogastroduodenoscopy  12/30/2011    Procedure: ESOPHAGOGASTRODUODENOSCOPY (EGD);  Surgeon: [name] DareMalcolm T Stark, MD,FACG;  Location: [facility];  Service: Endoscopy;  Laterality: N/A;  . Laparoscopic total hysterectomy  03/17/13  . Abdominal hysterectomy      Allergies  Allergies  Allergen Reactions  . Amlodipine     Hands turn red  . Peppermint Oil     asthma  . Fluticasone-Salmeterol Palpitations and Other (See Comments)    chest pain  . Latex Rash and Other (See Comments)    wheezing  . Spinach Rash    HPI  A pleasant, morbidly obese 38 year old female with prior medical history of hypertension, asthma, was been experiencing exertional chest pains associated with shortness of breath dizziness and nausea. This has started these year when she presented to the ER was rule out for ACS pulmonary embolism and discharge home. The patient was recently diagnosed with hypertension and started on hydrochlorothiazide. She has stated that her chest pain has been progressively getting worse currently  okay during at work when she is just sitting behind a computer. Her grandfather had myocardial infarction she doesn't know the age. Her father died for unknown reason. She also has lower extremity edema and dyspnea on exertion. No syncope. She has never smoked, maternal grandmother dies of myocardial infarction at age 37, great grand mother at age of 11041. She is post hysterectomy with preserved ovaries. The patient developed acute chest pain while being in the clinic with shortness of breath and dizziness, pain 8 out of 10 with it with sublingual nitroglycerin in 1:50 out of 10. EKG in the clinic shows 0.5 mm ST elevations in the lead to aVF or in V5. There is also some mild diffuse PR segment depression.  Home Medications  Prior to Admission medications   Medication Sig Start Date End Date Taking? Authorizing Provider  albuterol (PROAIR HFA) 108 (90 BASE) MCG/ACT inhaler Inhale 2 puffs into the lungs every 4 (four) hours as needed for wheezing or shortness of breath. 01/14/13  Yes Sandford CrazeMelissa O'Sullivan, NP  albuterol (PROVENTIL) (2.5 MG/3ML) 0.083% nebulizer solution Take 3 mLs (2.5 mg total) by nebulization every 4 (four) hours as needed. For shortness of breath or wheezing 01/14/13  Yes Sandford CrazeMelissa O'Sullivan, NP  beclomethasone (QVAR) 40 MCG/ACT inhaler Inhale 2 puffs into the lungs 2 (two) times daily. Take 2 puffs first thing in the  Am and then another 2 puffs about 12 hours later. 02/09/13  Yes Sandford Craze, NP  cetirizine (ZYRTEC) 10 MG tablet Take 10 mg by mouth daily.   Yes Historical Provider, MD  ibuprofen (ADVIL,MOTRIN) 200 MG tablet Take 200 mg by mouth once as needed. For pain--take as directed on bottle   Yes Historical Provider, MD  Multiple Vitamin (MULITIVITAMIN WITH MINERALS) TABS Take 1 tablet by mouth daily.   Yes Historical Provider, MD  pantoprazole (PROTONIX) 40 MG tablet Take 1 tablet (40 mg total) by mouth daily. Take 30- 60 min before your first meal of the day 12/17/12  Yes Kalman Shan, MD  hydrochlorothiazide (MICROZIDE) 12.5 MG capsule Take 12.5 mg by mouth daily.    Historical Provider, MD    Family History  Family History  Problem Relation Age of Onset  . Hypertension Mother   . Diabetes type II Mother   . Other Mother     cervical dysplasia  . Ovarian cancer Mother   . Hypertension Father   . Bipolar disorder Father   . Heart disease Maternal Grandmother   . Hypertension Maternal Grandmother   . Bipolar disorder Maternal Grandmother   . Hypertension Maternal Grandfather   . Hypertension Paternal Grandmother   . Hypertension Paternal Grandfather   . Hypertension Sister   . Heart attack Neg Hx   . Hyperlipidemia Neg Hx   . Sudden death Neg Hx     Social History  History   Social History  . Marital Status: Divorced    Spouse Name: N/A    Number of Children: 3  . Years of Education: N/A   Occupational History  . CUSTOMER [employer]   Social History Main Topics  . Smoking status: Never Smoker   . Smokeless tobacco: Never Used  . Alcohol Use: No  . Drug Use: No  . Sexual Activity: Yes    Birth Control/ Protection: Surgical   Other Topics Concern  . Not on file   Social History Narrative   Regular exercise:  Yes   Works in Clinical biochemist at [employer] [place]   Nearing end of a divorce- feels relieved.     Review of Systems, as per HPI, otherwise negative General:  No chills, fever, night sweats or weight changes.  Cardiovascular:  No chest pain, dyspnea on exertion, edema, orthopnea, palpitations, paroxysmal nocturnal dyspnea. Dermatological: No rash, lesions/masses Respiratory: No cough, dyspnea Urologic: No hematuria, dysuria Abdominal:   No nausea, vomiting, diarrhea, bright red blood per rectum, melena, or hematemesis Neurologic:  No visual changes, wkns, changes in mental status. All other systems reviewed and are otherwise negative except as noted above.  Physical Exam  Blood pressure 126/98, pulse 106,  height 5' 6.75" (1.695 m), weight 427 lb (193.686 kg), last menstrual period 06/27/2012, SpO2 97.00%.  General: Pleasant, in acute distress due to chest pain. Psych: Normal affect. Neuro: Alert and oriented X 3. Moves all extremities spontaneously. HEENT: Normal  Neck: Supple without bruits or JVD. Lungs:  Resp regular and unlabored, CTA. Heart: RRR no s3, s4, 3/6 systolic murmur. Abdomen: Soft, non-tender, non-distended, BS + x 4.  Extremities: No clubbing, cyanosis, mild chronic edema, DP/PT/Radials 2+ and equal bilaterally.  Labs:  No results found for this basename: CKTOTAL, CKMB, TROPONINI,  in the last 72 hours Lab Results  Component Value Date   WBC 12.3* 02/02/2014   HGB 11.8* 02/02/2014   HCT 37.1 02/02/2014   MCV 85.3 02/02/2014   PLT  351.0 02/02/2014    Lab Results  Component Value Date   DDIMER 0.68* 07/12/2013   No components found with this basename: POCBNP,     Component Value Date/Time   NA 138 02/02/2014 1608   K 4.3 02/02/2014 1608   CL 107 02/02/2014 1608   CO2 23 02/02/2014 1608   GLUCOSE 125* 02/02/2014 1608   BUN 14 02/02/2014 1608   CREATININE 0.9 02/02/2014 1608   CREATININE 0.78 07/04/2013 1034   CALCIUM 9.6 02/02/2014 1608   PROT 7.6 08/08/2013 0937   ALBUMIN 3.2* 08/08/2013 0937   AST 16 08/08/2013 0937   ALT 16 08/08/2013 0937   ALKPHOS 83 08/08/2013 0937   BILITOT 0.3 08/08/2013 0937   GFRNONAA >90 08/08/2013 0937   GFRNONAA >89 07/04/2013 1034   GFRAA >90 08/08/2013 0937   GFRAA >89 07/04/2013 1034   Lab Results  Component Value Date   CHOL 118 07/04/2013   HDL 51 07/04/2013   LDLCALC 50 07/04/2013   TRIG 87 07/04/2013   Accessory Clinical Findings  Echocardiogram - none  ECG - SR, prior anterior MI, negative T waves in the anterolateral leads    Assessment & Plan  38 year old female  1. Typical exertional chest - unstable angina with progressively worsening symptoms, currently also at rest, we will add ASA 81 mg po daily, no betablockers - asthma  on daily inhalors, add Cardizem CD 120 mg po daily.  She is having ongoing pain relieved by nitroglycerin with minimal 0.5 mm ST elevation in the inferolateral leads, we will transfer to the hospital for urgent cardiac catheterization, doesn't qualify for STEMI but concerning, baseline ECG showed inefrolateral negative T waves. The patient was given for aspirins total of 324 mg in the clinic. She is currently chest pain-free. She'll need to go to the Cath Lab as soon as possible, differential includes acute coronary syndrome versus possible acute pericarditis based on her EKG even though her presentation doesn't fit the picture at all. Start Heparin iv once in the hospital.  Cultural troponin once patient admitted, she has recent lab from last month with normal kidney function.  2. Hypertension - we will add Cardizem CD 120 mg daily.  3. Lipids - not check, we will start atorvastatin 40 mg po daily.  4. Systolic murmur - since birth, we will check echocardiogram   Lars MassonNELSON, Natahsa Marian H, MD, [facility] 03/07/2014, 11:48 AM

## 2014-03-08 ENCOUNTER — Encounter (HOSPITAL_COMMUNITY): Payer: Self-pay | Admitting: Nurse Practitioner

## 2014-03-08 DIAGNOSIS — I10 Essential (primary) hypertension: Secondary | ICD-10-CM | POA: Diagnosis not present

## 2014-03-08 DIAGNOSIS — R079 Chest pain, unspecified: Secondary | ICD-10-CM | POA: Diagnosis not present

## 2014-03-08 DIAGNOSIS — J45909 Unspecified asthma, uncomplicated: Secondary | ICD-10-CM | POA: Diagnosis not present

## 2014-03-08 LAB — BASIC METABOLIC PANEL
Anion gap: 13 (ref 5–15)
BUN: 9 mg/dL (ref 6–23)
CO2: 20 meq/L (ref 19–32)
CREATININE: 0.74 mg/dL (ref 0.50–1.10)
Calcium: 9.2 mg/dL (ref 8.4–10.5)
Chloride: 102 mEq/L (ref 96–112)
GFR calc Af Amer: >90 mL/min (ref >90)
GFR calc non Af Amer: >90 mL/min (ref >90)
GLUCOSE: 133 mg/dL — AB (ref 70–99)
Potassium: 4.4 mEq/L (ref 3.7–5.3)
Sodium: 135 mEq/L — ABNORMAL LOW (ref 137–147)

## 2014-03-08 LAB — HEMOGLOBIN A1C
Hgb A1c MFr Bld: 6.2 % — ABNORMAL HIGH (ref <5.7)
Mean Plasma Glucose: 131 mg/dL — ABNORMAL HIGH (ref <117)

## 2014-03-08 LAB — CBC
HCT: 41.8 % (ref 36.0–46.0)
Hemoglobin: 13.4 g/dL (ref 12.0–15.0)
MCH: 27.2 pg (ref 26.0–34.0)
MCHC: 32.1 g/dL (ref 30.0–36.0)
MCV: 85 fL (ref 78.0–100.0)
Platelets: 285 10*3/uL (ref 150–400)
RBC: 4.92 MIL/uL (ref 3.87–5.11)
RDW: 15.3 % (ref 11.5–15.5)
WBC: 7.9 10*3/uL (ref 4.0–10.5)

## 2014-03-08 LAB — PROTIME-INR
INR: 1.07 (ref 0.00–1.49)
Prothrombin Time: 14 seconds (ref 11.6–15.2)

## 2014-03-08 LAB — LIPID PANEL
Cholesterol: 98 mg/dL (ref 0–200)
HDL: 29 mg/dL — AB (ref >39)
LDL CALC: 43 mg/dL (ref 0–99)
TRIGLYCERIDES: 131 mg/dL (ref <150)
Total CHOL/HDL Ratio: 3.4 RATIO
VLDL: 26 mg/dL (ref 0–40)

## 2014-03-08 MED ORDER — ACTIVE PARTNERSHIP FOR HEALTH OF YOUR HEART BOOK
Freq: Once | Status: AC
  Administered 2014-03-08: 05:00:00
  Filled 2014-03-08: qty 1

## 2014-03-08 MED ORDER — OMEPRAZOLE MAGNESIUM 20 MG PO TBEC: 20.0000 mg | DELAYED_RELEASE_TABLET | Freq: Every day | ORAL | Status: DC

## 2014-03-08 MED ORDER — IBUPROFEN 200 MG PO TABS: 400.0000 mg | ORAL_TABLET | ORAL | Status: DC | PRN

## 2014-03-08 MED ORDER — HYDROCHLOROTHIAZIDE 12.5 MG PO CAPS: 12.5000 mg | ORAL_CAPSULE | Freq: Every day | ORAL | Status: DC

## 2014-03-08 NOTE — Discharge Instructions (Signed)

## 2014-03-08 NOTE — Progress Notes (Addendum)
Patient Name: Anne NgYolonda Y Feltus Date of Encounter: 03/08/2014   Principal Problem:   Chest pain at rest Active Problems:   Extreme obesity   OSA (obstructive sleep apnea)   Essential hypertension   GERD (gastroesophageal reflux disease)   Asthma    SUBJECTIVE  Ms. Gaynell FaceMarshall is being seen today for follow up of chest pressure and ECG changes. She presented yesterday in clinic with a chief complaint of exertional chest pain with shortness of breath, dizziness, and nausea.  Her ECG in clinic was noted to have diffuse ST changes for which she was then [facility] cardiac catheter[facility] and monitoring.  Cardiac catheterization resulted in normal coronary blood flow without vessel disease.  Overnight she experienced sinus tachycardia when ambulating back and forth to the bathroom with dizziness and palpitations that were relieved immediately upon rest.  She also experienced left sided chest pressure last night at 8:30pm while at rest that resolved two minutes later along with a headache that was relieved shortly after administration of tylenol.  This morning she is feeling well but can feel substernal chest tightness that she associates with her asthma.   CURRENT MEDS . aspirin EC  81 mg Oral Daily  . atorvastatin  40 mg Oral q1800  . atorvastatin  40 mg Oral To ER  . diltiazem  120 mg Oral Daily  . fluticasone  2 puff Inhalation BID  . loratadine  10 mg Oral Daily  . sodium chloride  3 mL Intravenous Q12H  . sodium chloride  3 mL Intravenous Q12H   OBJECTIVE  Filed Vitals:   03/07/14 2145 03/07/14 2200 03/08/14 0013 03/08/14 0446  BP: 143/87 154/84 132/71 127/84  Pulse: 109 107 97 91  Temp:    98.1 F (36.7 C)  TempSrc:    Oral  Resp:   18 20  Height:      Weight:   428 lb 12.7 oz (194.5 kg)   SpO2: 98% 98% 98% 98%    Intake/Output Summary (Last 24 hours) at 03/08/14 0854 Last data filed at 03/08/14 0000  Gross per 24 hour  Intake   1080 ml  Output      0  ml  Net   1080 ml   Filed Weights   03/07/14 1315 03/08/14 0013  Weight: 427 lb (193.686 kg) 428 lb 12.7 oz (194.5 kg)   PHYSICAL EXAM  General: Pleasant, NAD. Neuro: Alert and oriented X 3. Moves all extremities spontaneously. Psych: Normal affect. HEENT:  Normal  Neck: Supple without bruits or JVD. Lungs:  Resp regular and unlabored, CTA. Heart: RRR no s3, s4, 1-2/6 systolic murmur noted loudest to pulmonic region. Abdomen: Soft, non-tender, non-distended, BS + x 4.  Extremities: No clubbing, cyanosis. DP/PT/Radials 2+ and equal bilaterally. Edema noted to ankles bilaterally with 1+ pitting to left side.   Accessory Clinical Findings  CBC  Recent Labs  03/07/14 1329 03/08/14 0500  WBC 6.2 7.9  HGB 13.0 13.4  HCT 39.7 41.8  MCV 84.5 85.0  PLT 240 285   Basic Metabolic Panel  Recent Labs  03/07/14 1329 03/07/14 2050 03/08/14 0500  NA 140  --  135*  K 4.0  --  4.4  CL 103  --  102  CO2 27  --  20  GLUCOSE 93  --  133*  BUN 10  --  9  CREATININE 0.78  --  0.74  CALCIUM 9.2  --  9.2  MG  --  1.9  --  Cardiac Enzymes  Recent Labs  03/07/14 1347  TROPONINI <0.30   Hemoglobin A1C  Recent Labs  03/07/14 2050  HGBA1C 6.2*   Fasting Lipid Panel  Recent Labs  03/08/14 0500  CHOL 98  HDL 29*  LDLCALC 43  TRIG [id number]  CHOLHDL 3.4   Thyroid Function Tests  Recent Labs  03/07/14 1900  TSH 2.320    TELE  NSR  ECG  Sinus rhythm, poor R-wave progression, left axis, questionable ST changes and ischemia  Radiology/Studies  Dg Chest Port 1 View  03/07/2014   CLINICAL DATA:  Chest pain.  EXAM: PORTABLE CHEST - 1 VIEW  COMPARISON:  August 08, 2013.  FINDINGS: The heart size and mediastinal contours are within normal limits. Both lungs are clear. No pneumothorax or pleural effusion is noted. The visualized skeletal structures are unremarkable.  IMPRESSION: No acute cardiopulmonary abnormality seen.   Electronically Signed   By: Roque LiasJames  Green M.D.    On: 03/07/2014 13:54    ASSESSMENT AND PLAN  1. Chest pain @ rest: 38 year old African American female who presented to clinic with exertional and resting chest pain on 03/07/14 with questionable ST changes on ECG. [facility] cardiac catheter[facility] and monitoring which resulted in normal coronaries without vessel disease.  She experienced one episode of chest pressure last night at rest that was relieved two minutes later and does feel palpitations with dizziness upon exertion to the bathroom. Nursing reports sinus tachycardia upon exertion during the night. Currently has no pain, shortness of breath, dizziness, palpitations.  Continue ASA 81mg  daily.  Nees aggressive risk factor management.  No further cardiac w/u indicated.  No clear evidence of pericarditis.  Will add short course of NSAID therapy and PPI and plan d/c today.  2.  Hypertension: BP 127/84, HR 90. Continue HCTZ amd Cardizem 120mg  daily.  3.  Lipid therapy: TC 98, LDL 43, HDL 29 on 03/08/14.  10 yr risk 1.8%.  Normal cors.  D/C statin (was not on at home previously).  4.  Extreme Obesity:  She needs significant lifestyle modification and outpatient nutrition counseling.  5.  Asthma:  No active wheezing.  Cont home meds.  Signed, Nicolasa Duckinghristopher Berge NP  Patient seen, examined. Available data reviewed. Agree with findings, assessment, and plan as outlined by Ward Givenshris Berge, NP. Exam reveals stable right radial site without hematoma or ecchymosis. Plan short course of NSAID for presumed costochondritis. Follow-up with PCP.  Tonny BollmanMichael Thinh Cuccaro, M.D. 03/08/2014 11:34 AM

## 2014-03-08 NOTE — Discharge Summary (Signed)
Discharge Summary   Patient ID: Amber Salas,  MRN: [id number], DOB/AGE: 37/11/1975 37 y.o.  Admit date: 03/07/2014 Discharge date: 03/08/2014  Primary Care Provider: Neena RhymesKatherine Tabori Primary Cardiologist: Eloy EndK. Nelson, MD   Discharge Diagnoses Principal Problem:   Chest pain at rest  **S/P normal cardiac catheterization this admission.  Active Problems:   Extreme obesity   OSA (obstructive sleep apnea)   Essential hypertension   GERD (gastroesophageal reflux disease)   Asthma   Allergies Allergies  Allergen Reactions  . Amlodipine     Hands turn red  . Peppermint Oil     asthma  . Fluticasone-Salmeterol Palpitations and Other (See Comments)    chest pain  . Latex Rash and Other (See Comments)    wheezing  . Spinach Rash    Procedures  Cardiac Catheterization 10.27.2015  Procedural Findings: Hemodynamics: AO 117/74 LV 121/6  Coronary angiography: Coronary dominance: right  Left mainstem: Widely patent without obstructive disease. Arises from the left coronary cusp. Divides into the LAD and left circumflex.  Left anterior descending (LAD): Patent to the LV apex. The diagonal branches are patent without obstruction.  Left circumflex (LCx): Large-caliber vessel. The first and second OM's are small and medium in caliber, respectively. The third obtuse marginal was larger in caliber. There is no obstructive disease noted.  Right coronary artery (RCA): Dominant vessel. There may be mild catheter-induced vasospasm of the proximal vessel. There is no significant obstruction noted. The proximal, mid, and distal RCA are patent. The PDA is patent.  Left ventriculography: Left ventricular systolic function is normal, LVEF is estimated at 65-70%, there is no significant mitral regurgitation  _____________   History of Present Illness  38 year old female with the above problem list. She has a history of atypical chest pain and was also recently diagnosed with  hypertension. Secondary to chest pain, she was seen in clinic by Dr. Delton SeeNelson on October 27. ECG showed diffuse ST segment abnormalities and patient was having active chest pain. She was sent from the office to the hospital for admission.  Hospital Course  Patient ruled out for myocardial infarction. She underwent diagnostic catheterization on October 27 in the setting of ongoing chest pain. This revealed normal coronary arteries. Post catheterization, she did have one episode of chest discomfort but is otherwise been ambulating without recurrent symptoms or limitations. Her ECG is stable and unchanged when compared to older ECGs. There is no pleuritic or positional component to her chest pain and we do not feel that this represents pericarditis or myocarditis. She will be discharged home today on a short course of nonsteroidal anti-inflammatory therapy along with a proton pump inhibitor.  Discharge Vitals Blood pressure 135/85, pulse 95, temperature 98.1 F (36.7 C), temperature source Oral, resp. rate 20, height 5\' 6"  (1.676 m), weight 428 lb 12.7 oz (194.5 kg), last menstrual period 06/27/2012, SpO2 98.00%.  Filed Weights   03/07/14 1315 03/08/14 0013  Weight: 427 lb (193.686 kg) 428 lb 12.7 oz (194.5 kg)    Labs  CBC  Recent Labs  03/07/14 1329 03/08/14 0500  WBC 6.2 7.9  HGB 13.0 13.4  HCT 39.7 41.8  MCV 84.5 85.0  PLT 240 285   Basic Metabolic Panel  Recent Labs  03/07/14 1329 03/07/14 2050 03/08/14 0500  NA 140  --  135*  K 4.0  --  4.4  CL 103  --  102  CO2 27  --  20  GLUCOSE 93  --  133*  BUN 10  --  9  CREATININE 0.78  --  0.74  CALCIUM 9.2  --  9.2  MG  --  1.9  --    Cardiac Enzymes  Recent Labs  03/07/14 1347  TROPONINI <0.30   Hemoglobin A1C  Recent Labs  03/07/14 2050  HGBA1C 6.2*   Fasting Lipid Panel  Recent Labs  03/08/14 0500  CHOL 98  HDL 29*  LDLCALC 43  TRIG 045131  CHOLHDL 3.4   Thyroid Function Tests  Recent Labs   03/07/14 1900  TSH 2.320    Disposition  Pt is being discharged home today in good condition.  Follow-up Plans & Appointments      Follow-up Information   Follow up with Neena RhymesKatherine Tabori, MD.   Specialty:  Family Medicine   Contact information:   [address] [place] [id number] [phone]       Follow up with Lars MassonNELSON, KATARINA H, MD On 03/27/2014. (9:00 AM)    Specialty:  Cardiology   Contact information:   [address] [place] [id number] [phone]       Discharge Medications    Medication List         albuterol (2.5 MG/3ML) 0.083% nebulizer solution  Commonly known as:  PROVENTIL  Take 3 mLs (2.5 mg total) by nebulization every 4 (four) hours as needed. For shortness of breath or wheezing     albuterol 108 (90 BASE) MCG/ACT inhaler  Commonly known as:  PROAIR HFA  Inhale 2 puffs into the lungs every 4 (four) hours as needed for wheezing or shortness of breath.     beclomethasone 40 MCG/ACT inhaler  Commonly known as:  QVAR  Inhale 2 puffs into the lungs 2 (two) times daily. Take 2 puffs first thing in the Am and then another 2 puffs about 12 hours later.     cetirizine 10 MG tablet  Commonly known as:  ZYRTEC  Take 10 mg by mouth daily.     hydrochlorothiazide 12.5 MG capsule  Commonly known as:  MICROZIDE  Take 1 capsule (12.5 mg total) by mouth daily.     ibuprofen 200 MG tablet  Commonly known as:  ADVIL,MOTRIN  Take 2 tablets (400 mg total) by mouth every 4 (four) hours as needed for mild pain.     multivitamin with minerals Tabs tablet  Take 1 tablet by mouth daily.     omeprazole 20 MG tablet  Commonly known as:  PRILOSEC OTC  Take 1 tablet (20 mg total) by mouth daily.     tetrahydrozoline-zinc 0.05-0.25 % ophthalmic solution  Commonly known as:  VISINE-AC  Place 2 drops into both eyes 3 (three) times daily as needed (dry itchy eyes).       Outstanding Labs/Studies  none  Duration of  Discharge Encounter   Greater than 30 minutes including physician time.  Signed, Nicolasa Duckinghristopher Berge NP 03/08/2014, 10:56 AM

## 2014-03-08 NOTE — Progress Notes (Signed)
BP 127/84.  Tele SR 95 at rest, ST 130's w/ walking to bathroom, received PO Cardizem last night.  Pt mildly SOB with exertion, denies CP.  Tylenol given for c/o HA and Rt wrist soreness.  Rt radial level 0.  Urine dark amber, encouraged PO fluids.  Discussed heart healthy diet and exercise in depth, pt eager to learn.  [facility] [employer] [facility] [employer] of your heart book given.  Pt very hard stick for labs, 2nd lab tech called for AM labs.

## 2014-03-08 NOTE — Progress Notes (Signed)
TR BAND REMOVAL  LOCATION:    right radial  DEFLATED PER PROTOCOL:    Yes.    TIME BAND OFF / DRESSING APPLIED:    2130   SITE UPON ARRIVAL:    Level 0  SITE AFTER BAND REMOVAL:    Level 0  REVERSE ALLEN'S TEST:     positive  CIRCULATION SENSATION AND MOVEMENT:    Within Normal Limits   Yes.    COMMENTS:   Written post radial cath instructions given and reviewed w/ pt.  Questions answered.Delton See. Tele shows SR 100's at rest.

## 2014-03-08 NOTE — Care Management Note (Addendum)
  Page 1 of 1   03/08/2014     11:54:38 AM CARE MANAGEMENT NOTE 03/08/2014  Patient:  Amber Salas,Amber Salas   Account Number:  [id number]  Date Initiated:  03/08/2014  Documentation initiated by:  Donato SchultzHUTCHINSON,Rohin Krejci  Subjective/Objective Assessment:   CP     Action/Plan:   CM to follow for disposition needs   Anticipated DC Date:  03/09/2014   Anticipated DC Plan:  HOME/SELF CARE  In-house referral  NA      DC Planning Services  CM consult  Medication Assistance      [facility]  NA   Choice offered to / List presented to:  NA           Status of service:  Completed, signed off Medicare Important Message given?   (If response is "NO", the following Medicare IM given date fields will be blank) Date Medicare IM given:   Medicare IM given by:   Date Additional Medicare IM given:   Additional Medicare IM given by:    Discharge Disposition:  HOME/SELF CARE  Per UR Regulation:  Reviewed for med. necessity/level of care/duration of stay  If discussed at Long Length of Stay Meetings, dates discussed:    Comments:  Jacquelinne Speak RN, BSN, MSHL, CCM  Nurse - Case Manager,  (Unit [phone])  [phone]  03/08/2014 Social:  From home Post heart cath Specialty Med Review:  None CM Consult to address medication asssitance with Flonase and Proair. CM provided medication assistance information from needymed.org with printed applications to submit for eligibility. Dispo:  Home / Self care.

## 2014-03-17 ENCOUNTER — Ambulatory Visit: Payer: 59 | Admitting: Cardiology

## 2014-03-31 ENCOUNTER — Ambulatory Visit: Payer: 59 | Admitting: Cardiology

## 2014-04-20 ENCOUNTER — Encounter (HOSPITAL_COMMUNITY): Payer: Self-pay | Admitting: Cardiovascular Disease

## 2014-04-21 ENCOUNTER — Ambulatory Visit: Payer: 59 | Admitting: Nurse Practitioner

## 2014-04-28 ENCOUNTER — Ambulatory Visit: Payer: 59 | Admitting: Family Medicine

## 2014-07-24 ENCOUNTER — Ambulatory Visit: Payer: Self-pay | Admitting: Family Medicine

## 2014-07-25 ENCOUNTER — Telehealth: Payer: Self-pay | Admitting: Family Medicine

## 2014-07-25 NOTE — Telephone Encounter (Signed)
In general those are ok to use though we have not seen her in a long time and have not seen her for this current back issue.

## 2014-07-26 ENCOUNTER — Ambulatory Visit (INDEPENDENT_AMBULATORY_CARE_PROVIDER_SITE_OTHER): Payer: 59 | Admitting: Family Medicine

## 2014-07-26 ENCOUNTER — Encounter: Payer: Self-pay | Admitting: Family Medicine

## 2014-07-26 VITALS — BP 171/91 | HR 105 | Wt >= 6400 oz

## 2014-07-26 DIAGNOSIS — M5416 Radiculopathy, lumbar region: Secondary | ICD-10-CM

## 2014-07-26 MED ORDER — HYDROCODONE-ACETAMINOPHEN 5-325 MG PO TABS: 1.0000 | ORAL_TABLET | Freq: Four times a day (QID) | ORAL | Status: DC | PRN

## 2014-07-26 MED ORDER — CYCLOBENZAPRINE HCL 10 MG PO TABS: 10.0000 mg | ORAL_TABLET | Freq: Three times a day (TID) | ORAL | Status: DC | PRN

## 2014-07-26 MED ORDER — PREDNISONE (PAK) 10 MG PO TABS: ORAL_TABLET | ORAL | Status: DC

## 2014-07-26 NOTE — Patient Instructions (Signed)
You have lumbar radiculopathy (a pinched nerve in your low back). A prednisone dose pack is the best option for immediate relief and may be prescribed. Day after finishing prednisone start ibuprofen 800mg  three times a day with food for pain and inflammation. Norco as needed for severe pain (no driving on this medicine). Flexeril as needed for muscle spasms (no driving on this medicine if it makes you sleepy). Stay as active as possible. Physical therapy has been shown to be helpful as well. Strengthening of low back muscles, abdominal musculature are key for long term pain relief. If not improving, will consider further imaging (MRI). Call me in 1 week to let me know how you're doing.

## 2014-07-27 NOTE — Progress Notes (Signed)
PCP: Neena Rhymes, MD  Subjective:   HPI: Patient is a 39 y.o. female here for low back pain.  Patient reports she had a pinched nerve in her low back on left side about 7 years ago following the birth of her son. Never completely improved from this. Then pain worsened about 7 months ago. Pain radiates down into left foot with tingling. Difficulty walking as a result. Has tried heating pad, hot baths, ibuprofen, stretches. No bowel/bladder dysfunction.  Past Medical History  Diagnosis Date  . Asthma     history of  . Retinal hole or tear     left eye- 1996 due to injury  . Obstructive sleep apnea on CPAP   . Extreme obesity   . OSA (obstructive sleep apnea) 08/30/2010  . GERD (gastroesophageal reflux disease)   . H/O hiatal hernia   . Headache(784.0)     migraines  . Hypertension   . Atypical chest pain     a. 02/2014 Cath: nl cors, EF 65-70%->d/c'd on nsaid and PPI.    Current Outpatient Prescriptions on File Prior to Visit  Medication Sig Dispense Refill  . albuterol (PROAIR HFA) 108 (90 BASE) MCG/ACT inhaler Inhale 2 puffs into the lungs every 4 (four) hours as needed for wheezing or shortness of breath. 3 Inhaler 3  . albuterol (PROVENTIL) (2.5 MG/3ML) 0.083% nebulizer solution Take 3 mLs (2.5 mg total) by nebulization every 4 (four) hours as needed. For shortness of breath or wheezing 225 mL 3  . beclomethasone (QVAR) 40 MCG/ACT inhaler Inhale 2 puffs into the lungs 2 (two) times daily. Take 2 puffs first thing in the Am and then another 2 puffs about 12 hours later. 3 Inhaler 3  . cetirizine (ZYRTEC) 10 MG tablet Take 10 mg by mouth daily.    . hydrochlorothiazide (MICROZIDE) 12.5 MG capsule Take 1 capsule (12.5 mg total) by mouth daily.    . Multiple Vitamin (MULITIVITAMIN WITH MINERALS) TABS Take 1 tablet by mouth daily.    [name] omeprazole (PRILOSEC OTC) 20 MG tablet Take 1 tablet (20 mg total) by mouth daily.    [name] tetrahydrozoline-zinc (VISINE-AC) 0.05-0.25 %  ophthalmic solution Place 2 drops into both eyes 3 (three) times daily as needed (dry itchy eyes).     No current facility-administered medications on file prior to visit.    Past Surgical History  Procedure Laterality Date  . Cesarean section  4/04  . Hernia repair  04/2009    abdominal hernia  . Wisdom tooth extraction  1999  . Eye surgery  11/2010    lasar repair of retinal tear  . Tubal ligation    . Esophagogastroduodenoscopy  12/30/2011    Procedure: ESOPHAGOGASTRODUODENOSCOPY (EGD);  Surgeon: [name] Dare, MD,FACG;  Location: [facility];  Service: Endoscopy;  Laterality: N/A;  . Laparoscopic total hysterectomy  03/17/13  . Abdominal hysterectomy    . Left heart catheterization with coronary angiogram N/A 03/07/2014    Procedure: LEFT HEART CATHETERIZATION WITH CORONARY ANGIOGRAM;  Surgeon: Micheline Chapman, MD;  Location: [facility] CATH LAB;  Service: Cardiovascular;  Laterality: N/A;    Allergies  Allergen Reactions  . Amlodipine     Hands turn red  . Peppermint Oil     asthma  . Fluticasone-Salmeterol Palpitations and Other (See Comments)    chest pain  . Latex Rash and Other (See Comments)    wheezing  . Spinach Rash    History   Social History  . Marital Status: Divorced  Spouse Name: N/A  . Number of Children: 3  . Years of Education: N/A   Occupational History  . CUSTOMER SERVICE [employer]   Social History Main Topics  . Smoking status: Never Smoker   . Smokeless tobacco: Never Used  . Alcohol Use: No  . Drug Use: No  . Sexual Activity: Yes    Birth Control/ Protection: Surgical   Other Topics Concern  . Not on file   Social History Narrative   Regular exercise:  Yes   Works in Clinical biochemistcustomer service at [employer]   Nearing end of a divorce- feels relieved.    Family History  Problem Relation Age of Onset  . Hypertension Mother   . Diabetes type II Mother   . Other Mother     cervical dysplasia  . Ovarian cancer Mother   .  Hypertension Father   . Bipolar disorder Father   . Heart disease Maternal Grandmother   . Hypertension Maternal Grandmother   . Bipolar disorder Maternal Grandmother   . Hypertension Maternal Grandfather   . Hypertension Paternal Grandmother   . Hypertension Paternal Grandfather   . Hypertension Sister   . Heart attack Neg Hx   . Hyperlipidemia Neg Hx   . Sudden death Neg Hx     BP 171/91 mmHg  Pulse 105  Wt 400 lb (181.439 kg)  LMP 06/27/2012  Review of Systems: See HPI above.    Objective:  Physical Exam:  Gen: NAD  Back: No gross deformity, scoliosis. TTP L paraspinal lumbar region.  No midline or bony TTP. FROM with pain on flexion and extension. Strength LEs 5/5 all muscle groups.   2+ MSRs in patellar and achilles tendons, equal bilaterally. Negative SLRs. Sensation diminished throughout left lower leg. Negative logroll bilateral hips Negative fabers and piriformis stretches.    Assessment & Plan:  1. Lumbar radiculopathy - start with prednisone dose pack, transition to ibuprofen.  Norco, flexeril as needed.  Call in 1 week to let me know how she's doing.  If not improving would consider MRI.

## 2014-07-28 DIAGNOSIS — M5442 Lumbago with sciatica, left side: Secondary | ICD-10-CM | POA: Insufficient documentation

## 2014-07-28 NOTE — Assessment & Plan Note (Signed)
start with prednisone dose pack, transition to ibuprofen.  Norco, flexeril as needed.  Call in 1 week to let me know how she's doing.  If not improving would consider MRI.

## 2014-08-03 ENCOUNTER — Telehealth: Payer: Self-pay | Admitting: Family Medicine

## 2014-08-03 NOTE — Telephone Encounter (Signed)
Called phone number in chart and was not able to contact patient. Will try again.

## 2014-08-03 NOTE — Telephone Encounter (Signed)
We discussed doing an MRI if she was not improving.  Not sure if she would need updated x-rays or not though first - last set I can see I believe is from 2013.

## 2014-08-08 ENCOUNTER — Ambulatory Visit: Payer: Self-pay | Admitting: Family Medicine

## 2014-08-14 ENCOUNTER — Ambulatory Visit (HOSPITAL_BASED_OUTPATIENT_CLINIC_OR_DEPARTMENT_OTHER)
Admission: RE | Admit: 2014-08-14 | Discharge: 2014-08-14 | Disposition: A | Discharge status: Home / Self Care | Payer: 59 | Source: Ambulatory Visit | Attending: Family Medicine | Admitting: Family Medicine

## 2014-08-14 ENCOUNTER — Ambulatory Visit (INDEPENDENT_AMBULATORY_CARE_PROVIDER_SITE_OTHER): Payer: 59 | Admitting: Family Medicine

## 2014-08-14 ENCOUNTER — Encounter: Payer: Self-pay | Admitting: Family Medicine

## 2014-08-14 VITALS — BP 159/108 | HR 112 | Ht 67.0 in | Wt >= 6400 oz

## 2014-08-14 DIAGNOSIS — M5416 Radiculopathy, lumbar region: Secondary | ICD-10-CM | POA: Diagnosis not present

## 2014-08-14 DIAGNOSIS — M5116 Intervertebral disc disorders with radiculopathy, lumbar region: Secondary | ICD-10-CM | POA: Insufficient documentation

## 2014-08-14 DIAGNOSIS — M5442 Lumbago with sciatica, left side: Secondary | ICD-10-CM

## 2014-08-14 DIAGNOSIS — M545 Low back pain: Secondary | ICD-10-CM | POA: Diagnosis present

## 2014-08-17 NOTE — Progress Notes (Addendum)
PCP: Neena Rhymes, MD  Subjective:   HPI: Patient is a 39 y.o. female here for low back pain.  3/16: Patient reports she had a pinched nerve in her low back on left side about 7 years ago following the birth of her son. Never completely improved from this. Then pain worsened about 7 months ago. Pain radiates down into left foot with tingling. Difficulty walking as a result. Has tried heating pad, hot baths, ibuprofen, stretches. No bowel/bladder dysfunction.  4/4: Patient returns with only mild improvement from last visit. Feels pain in left side of low back going into the leg and foot with numbness. She did improve with prednisone though this recurred. Taking muscle relaxants, using heating pad. No bowel/bladder dysfunction.   Past Medical History  Diagnosis Date  . Asthma     history of  . Retinal hole or tear     left eye- 1996 due to injury  . Obstructive sleep apnea on CPAP   . Extreme obesity   . OSA (obstructive sleep apnea) 08/30/2010  . GERD (gastroesophageal reflux disease)   . H/O hiatal hernia   . Headache(784.0)     migraines  . Hypertension   . Atypical chest pain     a. 02/2014 Cath: nl cors, EF 65-70%->d/c'd on nsaid and PPI.    Current Outpatient Prescriptions on File Prior to Visit  Medication Sig Dispense Refill  . albuterol (PROAIR HFA) 108 (90 BASE) MCG/ACT inhaler Inhale 2 puffs into the lungs every 4 (four) hours as needed for wheezing or shortness of breath. 3 Inhaler 3  . albuterol (PROVENTIL) (2.5 MG/3ML) 0.083% nebulizer solution Take 3 mLs (2.5 mg total) by nebulization every 4 (four) hours as needed. For shortness of breath or wheezing 225 mL 3  . beclomethasone (QVAR) 40 MCG/ACT inhaler Inhale 2 puffs into the lungs 2 (two) times daily. Take 2 puffs first thing in the Am and then another 2 puffs about 12 hours later. 3 Inhaler 3  . cetirizine (ZYRTEC) 10 MG tablet Take 10 mg by mouth daily.    . cyclobenzaprine (FLEXERIL) 10 MG tablet  Take 1 tablet (10 mg total) by mouth every 8 (eight) hours as needed. 60 tablet 1  . hydrochlorothiazide (MICROZIDE) 12.5 MG capsule Take 1 capsule (12.5 mg total) by mouth daily.    [name] HYDROcodone-acetaminophen (NORCO) 5-325 MG per tablet Take 1 tablet by mouth every 6 (six) hours as needed for moderate pain. 40 tablet 0  . Multiple Vitamin (MULITIVITAMIN WITH MINERALS) TABS Take 1 tablet by mouth daily.    [name] omeprazole (PRILOSEC OTC) 20 MG tablet Take 1 tablet (20 mg total) by mouth daily.    . predniSONE (STERAPRED UNI-PAK) 10 MG tablet 6 tabs po day 1, 5 tabs po day 2, 4 tabs po day 3, 3 tabs po day 4, 2 tabs po day 5, 1 tab po day 6 21 tablet 0  . tetrahydrozoline-zinc (VISINE-AC) 0.05-0.25 % ophthalmic solution Place 2 drops into both eyes 3 (three) times daily as needed (dry itchy eyes).     No current facility-administered medications on file prior to visit.    Past Surgical History  Procedure Laterality Date  . Cesarean section  4/04  . Hernia repair  04/2009    abdominal hernia  . Wisdom tooth extraction  1999  . Eye surgery  11/2010    lasar repair of retinal tear  . Tubal ligation    . Esophagogastroduodenoscopy  12/30/2011    Procedure: ESOPHAGOGASTRODUODENOSCOPY (EGD);  Surgeon: [name] DareMalcolm T Stark, MD,FACG;  [facility];  Service: Endoscopy;  Laterality: N/A;  . Laparoscopic total hysterectomy  03/17/13  . Abdominal hysterectomy    . Left heart catheterization with coronary angiogram N/A 03/07/2014    Procedure: LEFT HEART CATHETERIZATION WITH CORONARY ANGIOGRAM;  Surgeon: Micheline ChapmanMichael D Cooper, MD;  [facility];  Service: Cardiovascular;  Laterality: N/A;    Allergies  Allergen Reactions  . Amlodipine     Hands turn red  . Peppermint Oil     asthma  . Fluticasone-Salmeterol Palpitations and Other (See Comments)    chest pain  . Latex Rash and Other (See Comments)    wheezing  . Spinach Rash    History   Social History  . Marital Status: Divorced     Spouse Name: N/A  . Number of Children: 3  . Years of Education: N/A   Occupational History  . CUSTOMER SERVICE [employer]   Social History Main Topics  . Smoking status: Never Smoker   . Smokeless tobacco: Never Used  . Alcohol Use: No  . Drug Use: No  . Sexual Activity: Yes    Birth Control/ Protection: Surgical   Other Topics Concern  . Not on file   Social History Narrative   Regular exercise:  Yes   Works in Clinical biochemistcustomer service at [employer]   Nearing end of a divorce- feels relieved.    Family History  Problem Relation Age of Onset  . Hypertension Mother   . Diabetes type II Mother   . Other Mother     cervical dysplasia  . Ovarian cancer Mother   . Hypertension Father   . Bipolar disorder Father   . Heart disease Maternal Grandmother   . Hypertension Maternal Grandmother   . Bipolar disorder Maternal Grandmother   . Hypertension Maternal Grandfather   . Hypertension Paternal Grandmother   . Hypertension Paternal Grandfather   . Hypertension Sister   . Heart attack Neg Hx   . Hyperlipidemia Neg Hx   . Sudden death Neg Hx     BP 159/108 mmHg  Pulse 112  Ht 5\' 7"  (1.702 m)  Wt 425 lb (192.779 kg)  BMI 66.55 kg/m2  LMP 06/27/2012  Review of Systems: See HPI above.    Objective:  Physical Exam:  Gen: NAD  Back: No gross deformity, scoliosis. TTP L paraspinal lumbar region.  No midline or bony TTP. FROM with pain on flexion and extension. Strength LEs 5/5 all muscle groups.   2+ MSRs in patellar and achilles tendons, equal bilaterally. Negative SLRs. Sensation diminished throughout left lower leg. Negative logroll bilateral hips Negative fabers and piriformis stretches.    Assessment & Plan:  1. Lumbar radiculopathy - improvement was only transient with prednisone.  Radiographs showed mild DDD without other bony abnormalities to explain her pain.  Will move forward with MRI to assess for source of radiculopathy into left leg.  Will write  her for some restrictions at work (form to be scanned into chart).   Addendum:  MRI reviewed and discussed with patient.  No evidence of radiculopathic source on MRI.  Suspect more sciatica than origination from irritated nerve root.  Will go ahead with more aggressive physical therapy and home exercises, f/u in 5-6 weeks.

## 2014-08-17 NOTE — Assessment & Plan Note (Signed)
improvement was only transient with prednisone.  Radiographs showed mild DDD without other bony abnormalities to explain her pain.  Will move forward with MRI to assess for source of radiculopathy into left leg.  Will write her for some restrictions at work (form to be scanned into chart).

## 2014-08-26 ENCOUNTER — Ambulatory Visit (HOSPITAL_BASED_OUTPATIENT_CLINIC_OR_DEPARTMENT_OTHER)
Admission: RE | Admit: 2014-08-26 | Discharge: 2014-08-26 | Disposition: A | Discharge status: Home / Self Care | Payer: 59 | Source: Ambulatory Visit | Attending: Family Medicine | Admitting: Family Medicine

## 2014-08-26 DIAGNOSIS — M5442 Lumbago with sciatica, left side: Secondary | ICD-10-CM | POA: Insufficient documentation

## 2014-08-29 NOTE — Addendum Note (Signed)
Addended by: Kathi SimpersWISE, Dheeraj Hail F on: 08/29/2014 04:03 PM   Modules accepted: Orders

## 2014-09-13 ENCOUNTER — Telehealth: Payer: Self-pay | Admitting: Family Medicine

## 2014-09-13 NOTE — Telephone Encounter (Signed)
Pre Visit letter sent  °

## 2014-09-14 ENCOUNTER — Ambulatory Visit (INDEPENDENT_AMBULATORY_CARE_PROVIDER_SITE_OTHER): Payer: 59 | Admitting: Family Medicine

## 2014-09-14 ENCOUNTER — Encounter: Payer: Self-pay | Admitting: Family Medicine

## 2014-09-14 VITALS — BP 175/99 | HR 105 | Ht 67.0 in | Wt >= 6400 oz

## 2014-09-14 DIAGNOSIS — M5416 Radiculopathy, lumbar region: Secondary | ICD-10-CM

## 2014-09-18 NOTE — Progress Notes (Signed)
PCP: Neena RhymesKatherine Tabori, MD  Subjective:   HPI: Patient is a 39 y.o. female here for low back pain.  3/16: Patient reports she had a pinched nerve in her low back on left side about 7 years ago following the birth of her son. Never completely improved from this. Then pain worsened about 7 months ago. Pain radiates down into left foot with tingling. Difficulty walking as a result. Has tried heating pad, hot baths, ibuprofen, stretches. No bowel/bladder dysfunction.  4/4: Patient returns with only mild improvement from last visit. Feels pain in left side of low back going into the leg and foot with numbness. She did improve with prednisone though this recurred. Taking muscle relaxants, using heating pad. No bowel/bladder dysfunction.   5/5: Patient reports pain worsened today again to a 10/10. Radiates into left leg with numbness, tingling. No bowel/bladder dysfunction. Taking muscle relaxant, pain medication. Starts PT back next week.  Past Medical History  Diagnosis Date  . Asthma     history of  . Retinal hole or tear     left eye- 1996 due to injury  . Obstructive sleep apnea on CPAP   . Extreme obesity   . OSA (obstructive sleep apnea) 08/30/2010  . GERD (gastroesophageal reflux disease)   . H/O hiatal hernia   . Headache(784.0)     migraines  . Hypertension   . Atypical chest pain     a. 02/2014 Cath: nl cors, EF 65-70%->d/c'd on nsaid and PPI.    Current Outpatient Prescriptions on File Prior to Visit  Medication Sig Dispense Refill  . albuterol (PROAIR HFA) 108 (90 BASE) MCG/ACT inhaler Inhale 2 puffs into the lungs every 4 (four) hours as needed for wheezing or shortness of breath. 3 Inhaler 3  . albuterol (PROVENTIL) (2.5 MG/3ML) 0.083% nebulizer solution Take 3 mLs (2.5 mg total) by nebulization every 4 (four) hours as needed. For shortness of breath or wheezing 225 mL 3  . beclomethasone (QVAR) 40 MCG/ACT inhaler Inhale 2 puffs into the lungs 2 (two) times  daily. Take 2 puffs first thing in the Am and then another 2 puffs about 12 hours later. 3 Inhaler 3  . cetirizine (ZYRTEC) 10 MG tablet Take 10 mg by mouth daily.    . cyclobenzaprine (FLEXERIL) 10 MG tablet Take 1 tablet (10 mg total) by mouth every 8 (eight) hours as needed. 60 tablet 1  . hydrochlorothiazide (MICROZIDE) 12.5 MG capsule Take 1 capsule (12.5 mg total) by mouth daily.    [name]. HYDROcodone-acetaminophen (NORCO) 5-325 MG per tablet Take 1 tablet by mouth every 6 (six) hours as needed for moderate pain. 40 tablet 0  . Multiple Vitamin (MULITIVITAMIN WITH MINERALS) TABS Take 1 tablet by mouth daily.    [name]. omeprazole (PRILOSEC OTC) 20 MG tablet Take 1 tablet (20 mg total) by mouth daily.    . predniSONE (STERAPRED UNI-PAK) 10 MG tablet 6 tabs po day 1, 5 tabs po day 2, 4 tabs po day 3, 3 tabs po day 4, 2 tabs po day 5, 1 tab po day 6 21 tablet 0  . tetrahydrozoline-zinc (VISINE-AC) 0.05-0.25 % ophthalmic solution Place 2 drops into both eyes 3 (three) times daily as needed (dry itchy eyes).     No current facility-administered medications on file prior to visit.    Past Surgical History  Procedure Laterality Date  . Cesarean section  4/04  . Hernia repair  04/2009    abdominal hernia  . Wisdom tooth extraction  1999  .  Eye surgery  11/2010    lasar repair of retinal tear  . Tubal ligation    . Esophagogastroduodenoscopy  12/30/2011    Procedure: ESOPHAGOGASTRODUODENOSCOPY (EGD);  Surgeon: [name] DareMalcolm T Stark, MD,FACG;  [facility];  Service: Endoscopy;  Laterality: N/A;  . Laparoscopic total hysterectomy  03/17/13  . Abdominal hysterectomy    . Left heart catheterization with coronary angiogram N/A 03/07/2014    Procedure: LEFT HEART CATHETERIZATION WITH CORONARY ANGIOGRAM;  Surgeon: Micheline ChapmanMichael D Cooper, MD;  [facility];  Service: Cardiovascular;  Laterality: N/A;    Allergies  Allergen Reactions  . Amlodipine     Hands turn red  . Peppermint Oil     asthma  .  Fluticasone-Salmeterol Palpitations and Other (See Comments)    chest pain  . Latex Rash and Other (See Comments)    wheezing  . Spinach Rash    History   Social History  . Marital Status: Divorced    Spouse Name: N/A  . Number of Children: 3  . Years of Education: N/A   Occupational History  . CUSTOMER SERVICE [employer]   Social History Main Topics  . Smoking status: Never Smoker   . Smokeless tobacco: Never Used  . Alcohol Use: No  . Drug Use: No  . Sexual Activity: Yes    Birth Control/ Protection: Surgical   Other Topics Concern  . Not on file   Social History Narrative   Regular exercise:  Yes   Works in Clinical biochemistcustomer service at [employer]   Nearing end of a divorce- feels relieved.    Family History  Problem Relation Age of Onset  . Hypertension Mother   . Diabetes type II Mother   . Other Mother     cervical dysplasia  . Ovarian cancer Mother   . Hypertension Father   . Bipolar disorder Father   . Heart disease Maternal Grandmother   . Hypertension Maternal Grandmother   . Bipolar disorder Maternal Grandmother   . Hypertension Maternal Grandfather   . Hypertension Paternal Grandmother   . Hypertension Paternal Grandfather   . Hypertension Sister   . Heart attack Neg Hx   . Hyperlipidemia Neg Hx   . Sudden death Neg Hx     BP 175/99 mmHg  Pulse 105  Ht 5\' 7"  (1.702 m)  Wt 435 lb (197.315 kg)  BMI 68.11 kg/m2  LMP 06/27/2012  Review of Systems: See HPI above.    Objective:  Physical Exam:  Gen: NAD  Back: No gross deformity, scoliosis. TTP L paraspinal lumbar region.  No midline or bony TTP. FROM with pain on flexion and extension. Strength LEs 5/5 all muscle groups.   2+ MSRs in patellar and achilles tendons, equal bilaterally. Negative SLRs. Sensation diminished throughout left lower leg. Negative logroll bilateral hips    Assessment & Plan:  1. Lumbar radiculopathy - History still fits with this diagnosis though no source  identified on MRI.  Will go ahead with NCVs/EMGs as next step.  Can start PT next week as scheduled.

## 2014-09-18 NOTE — Assessment & Plan Note (Signed)
History still fits with this diagnosis though no source identified on MRI.  Will go ahead with NCVs/EMGs as next step.  Can start PT next week as scheduled.

## 2014-09-19 ENCOUNTER — Telehealth: Payer: Self-pay | Admitting: Family Medicine

## 2014-09-19 NOTE — Telephone Encounter (Signed)
Tried to contact patient, however unsuccessful. Will try again.

## 2014-09-19 NOTE — Telephone Encounter (Signed)
Tried to contact patient again, left message for patient to call office.

## 2014-09-19 NOTE — Telephone Encounter (Signed)
Within her paperwork it is written that over the next 6 weeks she may have a flareup lasting a couple days.  But I can't write her out for after PT days - they should be working with her to push her to the point where it's a little sore but not bad enough she misses days of work anyway.  And we're going ahead with the nerve conduction study to see if there's something there we need to refer to surgeon for (or potentially go ahead with injection).

## 2014-09-20 ENCOUNTER — Ambulatory Visit: Payer: 59 | Attending: Family Medicine | Admitting: Physical Therapy

## 2014-09-20 DIAGNOSIS — M2560 Stiffness of unspecified joint, not elsewhere classified: Secondary | ICD-10-CM | POA: Diagnosis not present

## 2014-09-20 DIAGNOSIS — R269 Unspecified abnormalities of gait and mobility: Secondary | ICD-10-CM | POA: Diagnosis not present

## 2014-09-20 DIAGNOSIS — R29898 Other symptoms and signs involving the musculoskeletal system: Secondary | ICD-10-CM | POA: Insufficient documentation

## 2014-09-20 DIAGNOSIS — M545 Low back pain: Secondary | ICD-10-CM | POA: Diagnosis present

## 2014-09-20 DIAGNOSIS — R293 Abnormal posture: Secondary | ICD-10-CM | POA: Diagnosis not present

## 2014-09-20 DIAGNOSIS — M5386 Other specified dorsopathies, lumbar region: Secondary | ICD-10-CM

## 2014-09-20 NOTE — Telephone Encounter (Signed)
Spoke to patient and she is going to come by the office to pick up her papers.

## 2014-09-20 NOTE — Therapy (Signed)
[facility] [address] [place], [place], [address] Phone: [phone]   Fax:  [phone]  Physical Therapy Evaluation  Patient Details  Name: Amber Salas MRN: [id number] Date of Birth: 1976/03/30 Referring Provider:  Lenda Kelp, MD  Encounter Date: 09/20/2014      PT End of Session - 09/20/14 1730    Visit Number 1   Number of Visits 16   Date for PT Re-Evaluation 11/15/14   PT Start Time 1630   PT Stop Time 1725   PT Time Calculation (min) 55 min   Activity Tolerance Patient tolerated treatment well;Patient limited by pain   Behavior During Therapy [facility] for tasks assessed/performed      Past Medical History  Diagnosis Date  . Asthma     history of  . Retinal hole or tear     left eye- 1996 due to injury  . Obstructive sleep apnea on CPAP   . Extreme obesity   . OSA (obstructive sleep apnea) 08/30/2010  . GERD (gastroesophageal reflux disease)   . H/O hiatal hernia   . Headache(784.0)     migraines  . Hypertension   . Atypical chest pain     a. 02/2014 Cath: nl cors, EF 65-70%->d/c'd on nsaid and PPI.    Past Surgical History  Procedure Laterality Date  . Cesarean section  4/04  . Hernia repair  04/2009    abdominal hernia  . Wisdom tooth extraction  1999  . Eye surgery  11/2010    lasar repair of retinal tear  . Tubal ligation    . Esophagogastroduodenoscopy  12/30/2011    Procedure: ESOPHAGOGASTRODUODENOSCOPY (EGD);  Surgeon: [name] Dare, MD,FACG;  Location[facility];  Service: Endoscopy;  Laterality: N/A;  . Laparoscopic total hysterectomy  03/17/13  . Abdominal hysterectomy    . Left heart catheterization with coronary angiogram N/A 03/07/2014    Procedure: LEFT HEART CATHETERIZATION WITH CORONARY ANGIOGRAM;  Surgeon: Micheline Chapman, MD;  Location: [facility] CATH LAB;  Service: Cardiovascular;  Laterality: N/A;    There were no vitals filed for this visit.  Visit Diagnosis:  Left low  back pain, with sciatica presence unspecified - Plan: PT plan of care cert/re-cert  Decreased ROM of lumbar spine - Plan: PT plan of care cert/re-cert  Weakness of left leg - Plan: PT plan of care cert/re-cert  Joint stiffness of spine - Plan: PT plan of care cert/re-cert  Abnormal posture - Plan: PT plan of care cert/re-cert  Abnormality of gait - Plan: PT plan of care cert/re-cert      Subjective Assessment - 09/20/14 1638    Subjective pt is a 39 y.o F with CC of low back pain with referral of symptoms to the Left foot. She reported intermittent low back pain for the last 7 years that has gotten worse in the last 2 months with the symptoms getting worse since onset this time.    Limitations Sitting;Lifting;Standing;Walking;House hold activities   How long can you sit comfortably? 1 1/2 hours   How long can you stand comfortably? 20 min   How long can you walk comfortably? 30 min   Diagnostic tests MRI 08/26/2014 L5-S1 disc degeneration.    Patient Stated Goals to be pain free, to be able to play with children.    Currently in Pain? Yes   Pain Score 3   took Ibuprofen at 12:30   Pain Location Back   Pain Orientation Left;Lower;Mid   Pain Descriptors /  Indicators Spasm;Tightness;Shooting   Pain Radiating Towards to the L foot   Pain Onset More than a month ago   Pain Frequency Constant   Aggravating Factors  walking, standing, bending, laying on R side   Pain Relieving Factors ibuprofen, hot shower, heat pad   Effect of Pain on Daily Activities limited endurance, Fear of falling,             [facility] PT Assessment - 09/20/14 1647    Assessment   Medical Diagnosis L low back pain with LLE sciatic   Onset Date --  7 years ago   Next MD Visit --  5 weeks   Prior Therapy no   Precautions   Precautions None   Restrictions   Weight Bearing Restrictions No   Balance Screen   Has the patient fallen in the past 6 months Yes   How many times? 1   Has the patient had a  decrease in activity level because of a fear of falling?  Yes   Is the patient reluctant to leave their home because of a fear of falling?  No  avoids certian situations    Home Environment   Living Enviornment Private residence   Living Arrangements Children   Available Help at Discharge Available PRN/intermittently;Available 24 hours/day   Type of Home House   Home Access Stairs to enter   Entrance Stairs-Number of Steps 2   Entrance Stairs-Rails Can reach both   Home Layout One level   Home Equipment [place] - single point   Prior Function   Level of Independence Independent with basic ADLs;Independent with homemaking with ambulation;Independent with gait;Independent with transfers   Vocation Full time employment;Student   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  prolonged sitting   Leisure hangout with family   Cognition   Overall Cognitive Status Within Functional Limits for tasks assessed   Observation/Other Assessments   Focus on Therapeutic Outcomes (FOTO)  63% limitation  projected to 44% limited   Posture/Postural Control   Posture/Postural Control Postural limitations   Postural Limitations Rounded Shoulders;Forward head;Increased lumbar lordosis   ROM / Strength   AROM / PROM / Strength AROM;Strength   AROM   AROM Assessment Site Lumbar   Lumbar Flexion 35  pain during movement and at end range and increase of sympto   Lumbar Extension 8   Strength   Strength Assessment Site Hip;Knee   Right/Left Hip Right;Left   Right Hip Flexion 4+/5   Right Hip Extension 4+/5   Right Hip ABduction 4+/5   Right Hip ADduction 4/5   Left Hip Flexion 4/5   Left Hip Extension 3+/5   Left Hip ABduction 4-/5   Left Hip ADduction 4-/5   Right/Left Knee Right;Left   Right Knee Flexion 4+/5   Right Knee Extension 4+/5   Left Knee Flexion 4-/5   Left Knee Extension 4-/5   Palpation   Palpation tenderness located at L1-L5 with bil paraspinal tenderness and pain in the L  upper gluteal area.    Slump test   Findings Positive   Side Left   Comment --  during slump, and sitting up straight   Straight Leg Raise   Findings Positive   Side  Left                   [facility] Adult PT Treatment/Exercise - 09/20/14 1647    Lumbar Exercises: Stretches   Piriformis Stretch 1 rep;30 seconds  performed while seated with manual assist  Lumbar Exercises: Supine   Other Supine Lumbar Exercises quad set x 10 hold for 2 sec   Lumbar Exercises: Prone   Other Prone Lumbar Exercises press ups on elbows x 15, on hands x 15                PT Education - 09/20/14 1729    Education provided Yes   Education Details evaluation findings, POC, goals, HEP, anatomical explanation   Person(s) Educated Patient   Methods Explanation   Comprehension Verbalized understanding          PT Short Term Goals - 09/20/14 1746    PT SHORT TERM GOAL #1   Title pt will be I with basic HEP 10/21/2014   Time 4   Period Weeks   Status New   PT SHORT TERM GOAL #2   Title pt will decrease low back pain to < 5/10 during and following standing for > 10-15 min to assist with exercise progression 10/21/2014   Time 4   Period Weeks   Status New   PT SHORT TERM GOAL #3   Title pt will increase LLE strength to > 4/5 to assist with decreasing her fear of falling 10/21/2014   Time 4   Period Weeks   Status New   PT SHORT TERM GOAL #4   Title pt will increase FOTO score by >10 points to assist with functional progression 10/21/2014   Time 4   Period Weeks   Status New   PT SHORT TERM GOAL #5   Title pt will increase trunk mobility by > 10 degrees in all planes to assist with ADLs 10/21/2014   Time 4   Period Weeks   Status New           PT Long Term Goals - 09/20/14 1750    PT LONG TERM GOAL #1   Title pt will be I with advanced HEP 11/15/14   Time 8   Period Weeks   Status New   PT LONG TERM GOAL #2   Title pt will increase trunk mobility in all planes by > 20  degrees to assist with functional mobility 11/15/14   Time 8   Period Weeks   Status New   PT LONG TERM GOAL #3   Title pt will exhibit <3/10 pain during prolonged standing or walking for >30 min to assist with personal goal of playing with kids in water gun fight 11/15/14   Time 8   Period Weeks   Status New   PT LONG TERM GOAL #4   Title pt will be able to verbalize and demonstrate techniques to reduce risk or reinjury of the low back via postural awarenss, lifting and carrying mechanics, and HEp 11/15/14   Time 8   Period Weeks   Status New   PT LONG TERM GOAL #5   Title pt will increase her FOTO score to > 50 to assist with her funcitonal capacity 11/15/14   Time 8   Period Weeks   Status New               Plan - 09/20/14 1730    Clinical Impression Statement Amber Salas to OPPT with CC of L low back pain with referral of symptoms down the l leg into the toes that has been going on intermittently for 7 years, with exacerbation the last 2 months. she demonstrates an increased fear of falling reporting that her L leg feels weak and occassionally gives out.  She has fallen 1 time in the last 6 months which she thought was due to hypovolemia from a previous problem. due to increased fear of falling only assessed trunk flexion (by table with chair in front) and ext. Flexion was at 35 degrees with pain and  referral of symptoms down the left leg, and ext to 8 degrees with pain at endrange. MMT reveals weakness of the L LE compare bil. slump testing and straight leg raise testing was positive indiciating possible disc pathology. During repeated prone on elbows/hands she reported no symptoms into the leg, and increased pain in the back, upon standing reported that she felt better walking. educated about the benefits of aquatic therapy, and pt reported she will go with her daughter during her swimming practices. She would benefit from skilled physical therpay to maximize her function by  addressing the impairments listed.    Pt will benefit from skilled therapeutic intervention in order to improve on the following deficits Abnormal gait;Obesity;Pain;Improper body mechanics;Postural dysfunction;Impaired flexibility;Difficulty walking;Decreased range of motion;Decreased activity tolerance;Decreased mobility;Decreased strength;Increased muscle spasms;Decreased balance;Decreased endurance;Decreased safety awareness;Increased fascial restricitons   Rehab Potential Good   PT Frequency 2x / week   PT Duration 8 weeks   PT Treatment/Interventions ADLs/Self Care Home Management;Moist Heat;Therapeutic activities;Patient/family education;Therapeutic exercise;Passive range of motion;Ultrasound;Gait training;Balance training;Manual techniques;Dry needling;Cryotherapy;Neuromuscular re-education;Electrical Stimulation   PT Next Visit Plan assess response to HEP, Lumbar mobilization, repeated extension in prone, LE strengthening. (fear of Falling), modalities for pain PRN   PT Home Exercise Plan lower trunk rotation, prone press-ups, and quad sets   Consulted and Agree with Plan of Care Patient         Problem List Patient Active Problem List   Diagnosis Date Noted  . Lumbar radiculopathy 07/28/2014  . Exertional chest pain 03/07/2014  . Unstable angina 03/07/2014  . Essential hypertension 03/07/2014  . Abnormal ECG 03/07/2014  . Chest pain at rest 03/07/2014  . Chest pain 02/02/2014  . HTN (hypertension) 07/04/2013  . Heart murmur, systolic 03/08/2013  . Extreme obesity 03/07/2013  . Obstructive apnea 03/07/2013  . Encounter for routine gynecological examination 06/30/2012  . Atypical chest pain 04/05/2012  . GERD (gastroesophageal reflux disease) 12/30/2011  . Other dysphagia 12/30/2011  . 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 Chronic cough 10/31/2011  . General medical examination 05/27/2011  . OSA (obstructive sleep apnea) 08/30/2010  . HIP PAIN, LEFT 03/13/2010  . Morbid obesity  10/17/2009  . ALLERGIC RHINITIS 09/19/2009  . Asthma 09/19/2009   Lulu Riding PT, DPT, LAT, ATC  09/20/2014  5:59 PM      [facility] [address] [place], [place], [address] Phone: [phone]   Fax:  [phone]

## 2014-09-20 NOTE — Patient Instructions (Signed)
   Lanah Steines PT, DPT, LAT, ATC  [facility] Phone: [phone]     

## 2014-09-25 NOTE — Addendum Note (Signed)
Addended by: Kathi SimpersWISE, Vaness Jelinski F on: 09/25/2014 01:21 PM   Modules accepted: Orders

## 2014-09-26 ENCOUNTER — Ambulatory Visit: Payer: 59 | Admitting: Physical Therapy

## 2014-09-26 DIAGNOSIS — M2560 Stiffness of unspecified joint, not elsewhere classified: Secondary | ICD-10-CM

## 2014-09-26 DIAGNOSIS — R269 Unspecified abnormalities of gait and mobility: Secondary | ICD-10-CM

## 2014-09-26 DIAGNOSIS — M545 Low back pain: Secondary | ICD-10-CM | POA: Diagnosis not present

## 2014-09-26 DIAGNOSIS — M5386 Other specified dorsopathies, lumbar region: Secondary | ICD-10-CM

## 2014-09-26 DIAGNOSIS — R29898 Other symptoms and signs involving the musculoskeletal system: Secondary | ICD-10-CM

## 2014-09-26 DIAGNOSIS — R293 Abnormal posture: Secondary | ICD-10-CM

## 2014-09-26 NOTE — Therapy (Signed)
[facility] [address] [place], [place], [address] Phone: [phone]   Fax:  [phone]  Physical Therapy Treatment  Patient Details  Name: Amber NgYolonda Y Salas MRN: [id number] Date of Birth: 12/15/1975 Referring Provider:  Sheliah Hatchabori, Katherine E, MD  Encounter Date: 09/26/2014      PT End of Session - 09/26/14 1034    Visit Number 2   Number of Visits 16   Date for PT Re-Evaluation 11/15/14   PT Start Time 1030   PT Stop Time 1110   PT Time Calculation (min) 40 min      Past Medical History  Diagnosis Date  . Asthma     history of  . Retinal hole or tear     left eye- 1996 due to injury  . Obstructive sleep apnea on CPAP   . Extreme obesity   . OSA (obstructive sleep apnea) 08/30/2010  . GERD (gastroesophageal reflux disease)   . H/O hiatal hernia   . Headache(784.0)     migraines  . Hypertension   . Atypical chest pain     a. 02/2014 Cath: nl cors, EF 65-70%->d/c'd on nsaid and PPI.    Past Surgical History  Procedure Laterality Date  . Cesarean section  4/04  . Hernia repair  04/2009    abdominal hernia  . Wisdom tooth extraction  1999  . Eye surgery  11/2010    lasar repair of retinal tear  . Tubal ligation    . Esophagogastroduodenoscopy  12/30/2011    Procedure: ESOPHAGOGASTRODUODENOSCOPY (EGD);  Surgeon: [name] DareMalcolm T Stark, MD,FACG;  [facility];  Service: Endoscopy;  Laterality: N/A;  . Laparoscopic total hysterectomy  03/17/13  . Abdominal hysterectomy    . Left heart catheterization with coronary angiogram N/A 03/07/2014    Procedure: LEFT HEART CATHETERIZATION WITH CORONARY ANGIOGRAM;  Surgeon: Micheline ChapmanMichael D Cooper, MD;  [facility];  Service: Cardiovascular;  Laterality: N/A;    There were no vitals filed for this visit.  Visit Diagnosis:  Left low back pain, with sciatica presence unspecified  Decreased ROM of lumbar spine  Weakness of left leg  Joint stiffness of  spine  Abnormal posture  Abnormality of gait      Subjective Assessment - 09/26/14 1035    Currently in Pain? Yes   Pain Score 8    Pain Location Back   Pain Orientation Mid;Lower   Pain Radiating Towards left leg and knee   Aggravating Factors  walking, bending   Pain Relieving Factors ibuprofen, hot shower, heat pad                         [facility] Adult PT Treatment/Exercise - 09/26/14 1040    Lumbar Exercises: Stretches   Prone on Elbows Stretch 60 seconds   Press Ups 5 reps   Press Ups Limitations 10 reps 5 seconds.    Lumbar Exercises: Prone   Single Arm Raise 10 reps   Single Arm Raises Limitations with cues for core recruitment.    Straight Leg Raise 10 reps   Straight Leg Raises Limitations alternating, minimal left from left   Other Prone Lumbar Exercises prone knee flexion x 10 each, needs assist with left hamstring curl.    Other Prone Lumbar Exercises prone hip IR/ER AROM x 10                PT Education - 09/26/14 1104    Education provided Yes  Education Details prone press ups, Alt UE raises alt LE raises     Person(s) Educated Patient   Methods Explanation;Handout   Comprehension Verbalized understanding          PT Short Term Goals - 09/20/14 1746    PT SHORT TERM GOAL #1   Title pt will be I with basic HEP 10/21/2014   Time 4   Period Weeks   Status New   PT SHORT TERM GOAL #2   Title pt will decrease low back pain to < 5/10 during and following standing for > 10-15 min to assist with exercise progression 10/21/2014   Time 4   Period Weeks   Status New   PT SHORT TERM GOAL #3   Title pt will increase LLE strength to > 4/5 to assist with decreasing her fear of falling 10/21/2014   Time 4   Period Weeks   Status New   PT SHORT TERM GOAL #4   Title pt will increase FOTO score by >10 points to assist with functional progression 10/21/2014   Time 4   Period Weeks   Status New   PT SHORT TERM GOAL #5   Title pt will  increase trunk mobility by > 10 degrees in all planes to assist with ADLs 10/21/2014   Time 4   Period Weeks   Status New           PT Long Term Goals - 09/20/14 1750    PT LONG TERM GOAL #1   Title pt will be I with advanced HEP 11/15/14   Time 8   Period Weeks   Status New   PT LONG TERM GOAL #2   Title pt will increase trunk mobility in all planes by > 20 degrees to assist with functional mobility 11/15/14   Time 8   Period Weeks   Status New   PT LONG TERM GOAL #3   Title pt will exhibit <3/10 pain during prolonged standing or walking for >30 min to assist with personal goal of playing with kids in water gun fight 11/15/14   Time 8   Period Weeks   Status New   PT LONG TERM GOAL #4   Title pt will be able to verbalize and demonstrate techniques to reduce risk or reinjury of the low back via postural awarenss, lifting and carrying mechanics, and HEp 11/15/14   Time 8   Period Weeks   Status New   PT LONG TERM GOAL #5   Title pt will increase her FOTO score to > 50 to assist with her funcitonal capacity 11/15/14   Time 8   Period Weeks   Status New               Plan - 09/26/14 1031    Clinical Impression Statement Pt presents to PT 15 minutes late for appointment. Pt reports improved ability to stand up straight to complete kitchen tasks and that Prone on elbows helps decrease the pain in legs. She will start water aerobics in June and has lightened her purse and only carries it as needed. Pt was instructed in prone pressups and prone alt UE/LE raises with decreased leg pain afterwards and increased low back pain reduced with moist heat in sitting. Issued prone HEP.    PT Next Visit Plan review and continue extension biased treatment.         Problem List Patient Active Problem List   Diagnosis Date Noted  . Lumbar radiculopathy 07/28/2014  .  Exertional chest pain 03/07/2014  . Unstable angina 03/07/2014  . Essential hypertension 03/07/2014  . Abnormal ECG  03/07/2014  . Chest pain at rest 03/07/2014  . Chest pain 02/02/2014  . HTN (hypertension) 07/04/2013  . Heart murmur, systolic 03/08/2013  . Extreme obesity 03/07/2013  . Obstructive apnea 03/07/2013  . Encounter for routine gynecological examination 06/30/2012  . Atypical chest pain 04/05/2012  . GERD (gastroesophageal reflux disease) 12/30/2011  . Other dysphagia 12/30/2011  . 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 Chronic cough 10/31/2011  . General medical examination 05/27/2011  . OSA (obstructive sleep apnea) 08/30/2010  . HIP PAIN, LEFT 03/13/2010  . Morbid obesity 10/17/2009  . ALLERGIC RHINITIS 09/19/2009  . Asthma 09/19/2009    Sherrie Mustacheonoho, Lakea Mittelman McGee, PTA 09/26/2014, 11:12 AM  [facility] [address] [place], [place], [address] Phone: [phone]   Fax:  [phone]

## 2014-09-26 NOTE — Patient Instructions (Signed)
  Extension   Lie face down, hands close to chest. Press trunk up, arching back. Keep neck long, shoulders down. Tighten buttocks to protect lower back. Hold __5__ seconds. Repeat __10__ times. Do __2__ sessions per day.    Bracing With Arm Lift (Prone)   With pillow support, lie on abdomen. Find neutral spine. Tighten pelvic floor and abdominals and hold . Alternately raise arms off floor. Repeat _10__ times. Do _2__ times a day.   Copyright  VHI. All rights reserved.  Bracing With Leg Lift (Prone)   With pillow support, lie on abdomen and neutral spine, tighten pelvic floor and abdominals and hold. Alternately raise legs off floor. Repeat _10__ times. Do 2___ times a day.   Copyright  VHI. All rights reserved.

## 2014-10-03 ENCOUNTER — Telehealth: Payer: Self-pay | Admitting: *Deleted

## 2014-10-03 ENCOUNTER — Encounter: Payer: Self-pay | Admitting: *Deleted

## 2014-10-03 NOTE — Telephone Encounter (Signed)
Pre-Visit Call completed with patient and chart updated.   Pre-Visit Info documented in Specialty Comments under SnapShot.    

## 2014-10-04 ENCOUNTER — Ambulatory Visit (INDEPENDENT_AMBULATORY_CARE_PROVIDER_SITE_OTHER): Payer: 59 | Admitting: Family Medicine

## 2014-10-04 ENCOUNTER — Encounter: Payer: Self-pay | Admitting: Family Medicine

## 2014-10-04 VITALS — BP 140/86 | HR 107 | Temp 98.0°F | Resp 16 | Ht 67.0 in | Wt >= 6400 oz

## 2014-10-04 DIAGNOSIS — Z01419 Encounter for gynecological examination (general) (routine) without abnormal findings: Secondary | ICD-10-CM

## 2014-10-04 NOTE — Progress Notes (Signed)
   Subjective:    Patient ID: Amber Salas, female    DOB: 01/13/1976, 39 y.o.   MRN: [id number]  HPI CPE- no need for pap due to hysterectomy.  No concerns today.  Attempting to lose weight w/ healthy food choices and regular exercise- walking and PT.  Will start water exercise 3x/week in June.   Review of Systems Patient reports no vision/ hearing changes, adenopathy,fever, weight change,  persistant/recurrent hoarseness , swallowing issues, chest pain, palpitations, edema, persistant/recurrent cough, hemoptysis, dyspnea (rest/exertional/paroxysmal nocturnal), gastrointestinal bleeding (melena, rectal bleeding), abdominal pain, significant heartburn, bowel changes, GU symptoms (dysuria, hematuria, incontinence), Gyn symptoms (abnormal  bleeding, pain),  syncope, focal weakness, memory loss, numbness & tingling, skin/hair/nail changes, abnormal bruising or bleeding, anxiety, or depression.     Objective:   Physical Exam  General Appearance:    Alert, cooperative, no distress, appears stated age  Head:    Normocephalic, without obvious abnormality, atraumatic  Eyes:    PERRL, conjunctiva/corneas clear, EOM's intact, fundi    benign, both eyes  Ears:    Normal TM's and external ear canals, both ears  Nose:   Nares normal, septum midline, mucosa normal, no drainage    or sinus tenderness  Throat:   Lips, mucosa, and tongue normal; teeth and gums normal  Neck:   Supple, symmetrical, trachea midline, no adenopathy;    Thyroid: no enlargement/tenderness/nodules  Back:     Symmetric, no curvature, ROM normal, no CVA tenderness  Lungs:     Clear to auscultation bilaterally, respirations unlabored  Chest Wall:    No tenderness or deformity   Heart:    Regular rate and rhythm, S1 and S2 normal, no murmur, rub   or gallop  Breast Exam:    No tenderness, masses, or nipple abnormality  Abdomen:     Soft, non-tender, bowel sounds active all four quadrants,    no masses, no organomegaly    Genitalia:    deferred  Rectal:    Extremities:   Extremities normal, atraumatic, no cyanosis or edema  Pulses:   2+ and symmetric all extremities  Skin:   Skin color, texture, turgor normal, no rashes or lesions  Lymph nodes:   Cervical, supraclavicular, and axillary nodes normal  Neurologic:   CNII-XII intact, normal strength, sensation and reflexes    throughout          Assessment & Plan:

## 2014-10-04 NOTE — Patient Instructions (Signed)
Follow up in 3 months to recheck weight loss progress We'll notify you of your lab results and make any changes if needed Try and make healthy food choices and get regular exercise Call and get your CPAP fixed- if you need a repeat sleep study we can help get this arranged Call with any questions or concerns Happy Memorial Day!!

## 2014-10-04 NOTE — Progress Notes (Signed)
Pre visit review using our clinic review tool, if applicable. No additional management support is needed unless otherwise documented below in the visit note. 

## 2014-10-05 LAB — VITAMIN D 25 HYDROXY (VIT D DEFICIENCY, FRACTURES): VITD: 10 ng/mL — ABNORMAL LOW (ref 30.00–100.00)

## 2014-10-05 LAB — CBC WITH DIFFERENTIAL/PLATELET
BASOS ABS: 0 10*3/uL (ref 0.0–0.1)
Basophils Relative: 0.3 % (ref 0.0–3.0)
Eosinophils Absolute: 0.5 10*3/uL (ref 0.0–0.7)
Eosinophils Relative: 4 % (ref 0.0–5.0)
HCT: 39.3 % (ref 36.0–46.0)
HEMOGLOBIN: 13.1 g/dL (ref 12.0–15.0)
Lymphocytes Relative: 36.5 % (ref 12.0–46.0)
Lymphs Abs: 4.2 10*3/uL — ABNORMAL HIGH (ref 0.7–4.0)
MCHC: 33.3 g/dL (ref 30.0–36.0)
MCV: 88 fl (ref 78.0–100.0)
Monocytes Absolute: 0.9 10*3/uL (ref 0.1–1.0)
Monocytes Relative: 7.6 % (ref 3.0–12.0)
NEUTROS PCT: 51.6 % (ref 43.0–77.0)
Neutro Abs: 5.9 10*3/uL (ref 1.4–7.7)
Platelets: 297 10*3/uL (ref 150.0–400.0)
RBC: 4.46 Mil/uL (ref 3.87–5.11)
RDW: 14.4 % (ref 11.5–15.5)
WBC: 11.4 10*3/uL — AB (ref 4.0–10.5)

## 2014-10-05 LAB — BASIC METABOLIC PANEL
BUN: 15 mg/dL (ref 6–23)
CO2: 27 mEq/L (ref 19–32)
Calcium: 9.4 mg/dL (ref 8.4–10.5)
Chloride: 104 mEq/L (ref 96–112)
Creatinine, Ser: 0.98 mg/dL (ref 0.40–1.20)
GFR: 81.88 mL/min (ref >60.00)
Glucose, Bld: 111 mg/dL — ABNORMAL HIGH (ref 70–99)
POTASSIUM: 4.5 meq/L (ref 3.5–5.1)
Sodium: 137 mEq/L (ref 135–145)

## 2014-10-05 LAB — HEMOGLOBIN A1C: HEMOGLOBIN A1C: 5.9 % (ref 4.6–6.5)

## 2014-10-05 LAB — HEPATIC FUNCTION PANEL
ALBUMIN: 3.9 g/dL (ref 3.5–5.2)
ALT: 17 U/L (ref 0–35)
AST: 17 U/L (ref 0–37)
Alkaline Phosphatase: 63 U/L (ref 39–117)
BILIRUBIN DIRECT: 0.1 mg/dL (ref 0.0–0.3)
Total Bilirubin: 0.2 mg/dL (ref 0.2–1.2)
Total Protein: 7.6 g/dL (ref 6.0–8.3)

## 2014-10-05 LAB — LIPID PANEL
CHOL/HDL RATIO: 3
CHOLESTEROL: 118 mg/dL (ref 0–200)
HDL: 43.9 mg/dL (ref >39.00)
LDL CALC: 47 mg/dL (ref 0–99)
NONHDL: 74.1
TRIGLYCERIDES: 134 mg/dL (ref 0.0–149.0)
VLDL: 26.8 mg/dL (ref 0.0–40.0)

## 2014-10-05 LAB — TSH: TSH: 1.72 u[IU]/mL (ref 0.35–4.50)

## 2014-10-06 ENCOUNTER — Ambulatory Visit: Payer: 59 | Admitting: Physical Therapy

## 2014-10-06 ENCOUNTER — Other Ambulatory Visit: Payer: Self-pay | Admitting: General Practice

## 2014-10-06 DIAGNOSIS — R269 Unspecified abnormalities of gait and mobility: Secondary | ICD-10-CM

## 2014-10-06 DIAGNOSIS — M545 Low back pain: Secondary | ICD-10-CM | POA: Diagnosis not present

## 2014-10-06 DIAGNOSIS — R29898 Other symptoms and signs involving the musculoskeletal system: Secondary | ICD-10-CM

## 2014-10-06 DIAGNOSIS — R293 Abnormal posture: Secondary | ICD-10-CM

## 2014-10-06 DIAGNOSIS — M2560 Stiffness of unspecified joint, not elsewhere classified: Secondary | ICD-10-CM

## 2014-10-06 DIAGNOSIS — M5386 Other specified dorsopathies, lumbar region: Secondary | ICD-10-CM

## 2014-10-06 MED ORDER — VITAMIN D (ERGOCALCIFEROL) 1.25 MG (50000 UNIT) PO CAPS: 50000.0000 [IU] | ORAL_CAPSULE | ORAL | Status: AC

## 2014-10-06 NOTE — Patient Instructions (Signed)

## 2014-10-06 NOTE — Therapy (Signed)
[facility] [address] [place], [place], [address] Phone: [phone]   Fax:  [phone]  Physical Therapy Treatment  Patient Details  Name: Amber Salas MRN: [id number] Date of Birth: 12/08/1975 Referring Provider:  Sheliah Hatchabori, Katherine E, MD  Encounter Date: 10/06/2014      PT End of Session - 10/06/14 0930    Visit Number 3   Number of Visits 16   Date for PT Re-Evaluation 11/15/14   PT Start Time 0900   PT Stop Time 0940   PT Time Calculation (min) 40 min      Past Medical History  Diagnosis Date  . Asthma     history of  . Retinal hole or tear     left eye- 1996 due to injury  . Obstructive sleep apnea on CPAP   . Extreme obesity   . OSA (obstructive sleep apnea) 08/30/2010  . GERD (gastroesophageal reflux disease)   . H/O hiatal hernia   . Headache(784.0)     migraines  . Hypertension   . Atypical chest pain     a. 02/2014 Cath: nl cors, EF 65-70%->d/c'd on nsaid and PPI.    Past Surgical History  Procedure Laterality Date  . Cesarean section  4/04  . Hernia repair  04/2009    abdominal hernia  . Wisdom tooth extraction  1999  . Eye surgery  11/2010    lasar repair of retinal tear  . Tubal ligation    . Esophagogastroduodenoscopy  12/30/2011    Procedure: ESOPHAGOGASTRODUODENOSCOPY (EGD);  Surgeon: [name] DareMalcolm T Stark, MD,FACG;  [facility];  Service: Endoscopy;  Laterality: N/A;  . Laparoscopic total hysterectomy  03/17/13  . Abdominal hysterectomy    . Left heart catheterization with coronary angiogram N/A 03/07/2014    Procedure: LEFT HEART CATHETERIZATION WITH CORONARY ANGIOGRAM;  Surgeon: Micheline ChapmanMichael D Cooper, MD;  [facility];  Service: Cardiovascular;  Laterality: N/A;    There were no vitals filed for this visit.  Visit Diagnosis:  Left low back pain, with sciatica presence unspecified  Decreased ROM of lumbar spine  Weakness of left leg  Joint stiffness of  spine  Abnormal posture  Abnormality of gait      Subjective Assessment - 10/06/14 0909    Subjective "I felt like I may have overdone it the other day, but have noticed that I am getting better, now I am just feeling weak".   Currently in Pain? Yes   Pain Score 3    Pain Location Back   Pain Orientation Mid;Lower   Pain Descriptors / Indicators Aching;Spasm;Shooting   Pain Type Chronic pain   Pain Onset More than a month ago   Pain Frequency Intermittent   Aggravating Factors  walking bending                         [facility] Adult PT Treatment/Exercise - 10/06/14 0001    Lumbar Exercises: Stretches   Prone on Elbows Stretch 60 seconds   Press Ups 5 reps   Press Ups Limitations 10 reps 5 seconds.    Lumbar Exercises: Prone   Other Prone Lumbar Exercises prone knee flexion x 10 each, needs assist with left hamstring curl.    Other Prone Lumbar Exercises prone hip IR/ER AROM x 10   Modalities   Modalities Moist Heat   Moist Heat Therapy   Number Minutes Moist Heat 10 Minutes   Moist Heat Location Lumbar Spine  in prone   Manual Therapy   Manual Therapy Soft tissue mobilization   Soft tissue mobilization DTM/ trigger point release of R lumbar/ thoracic paraspinals                PT Education - 10/06/14 0930    Education provided Yes   Education Details posture education handout   Person(s) Educated Patient   Methods Explanation   Comprehension Verbalized understanding          PT Short Term Goals - 10/06/14 0933    PT SHORT TERM GOAL #1   Title pt will be I with basic HEP 10/21/2014   Time 4   Period Weeks   Status On-going   PT SHORT TERM GOAL #2   Title pt will decrease low back pain to < 5/10 during and following standing for > 10-15 min to assist with exercise progression 10/21/2014   Time 4   Period Weeks   Status On-going   PT SHORT TERM GOAL #3   Title pt will increase LLE strength to > 4/5 to assist with decreasing her fear of  falling 10/21/2014   Time 4   Period Weeks   Status On-going   PT SHORT TERM GOAL #4   Title pt will increase FOTO score by >10 points to assist with functional progression 10/21/2014   Period Weeks   Status On-going   PT SHORT TERM GOAL #5   Title pt will increase trunk mobility by > 10 degrees in all planes to assist with ADLs 10/21/2014   Time 4   Period Weeks   Status On-going           PT Long Term Goals - 10/06/14 0933    PT LONG TERM GOAL #1   Title pt will be I with advanced HEP 11/15/14   Time 8   Period Weeks   Status On-going   PT LONG TERM GOAL #2   Title pt will increase trunk mobility in all planes by > 20 degrees to assist with functional mobility 11/15/14   Time 8   Period Weeks   Status On-going   PT LONG TERM GOAL #3   Title pt will exhibit <3/10 pain during prolonged standing or walking for >30 min to assist with personal goal of playing with kids in water gun fight 11/15/14   Time 8   Period Weeks   Status On-going   PT LONG TERM GOAL #4   Title pt will be able to verbalize and demonstrate techniques to reduce risk or reinjury of the low back via postural awarenss, lifting and carrying mechanics, and HEp 11/15/14   Time 8   Period Weeks   Status On-going   PT LONG TERM GOAL #5   Title pt will increase her FOTO score to > 50 to assist with her funcitonal capacity 11/15/14   Time 8   Period Weeks   Status On-going               Plan - 10/06/14 0930    Clinical Impression Statement Cyd presents to therapy 20 min late today. she reports that her pain is getting better however she states that she felt like she overdid it lifting a pot off the stove top and feels a little more sore today. AFter performing repeated extensions in prone, in combination with STM for the R paraspinals she reported significant relief to 0-1/10. plan to progress with LE strengthening as tolerated.    PT Next Visit  Plan continue repeated ext biased treatment, LE strengthening,  manual for muscle tightness/spasm. modalities PRN   PT Home Exercise Plan postural education   Consulted and Agree with Plan of Care Patient        Problem List Patient Active Problem List   Diagnosis Date Noted  . Lumbar radiculopathy 07/28/2014  . Exertional chest pain 03/07/2014  . Unstable angina 03/07/2014  . Essential hypertension 03/07/2014  . Abnormal ECG 03/07/2014  . Chest pain at rest 03/07/2014  . Chest pain 02/02/2014  . HTN (hypertension) 07/04/2013  . Heart murmur, systolic 03/08/2013  . Extreme obesity 03/07/2013  . Obstructive apnea 03/07/2013  . Encounter for routine gynecological examination 06/30/2012  . Atypical chest pain 04/05/2012  . GERD (gastroesophageal reflux disease) 12/30/2011  . Other dysphagia 12/30/2011  . 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 Chronic cough 10/31/2011  . General medical examination 05/27/2011  . OSA (obstructive sleep apnea) 08/30/2010  . HIP PAIN, LEFT 03/13/2010  . Morbid obesity 10/17/2009  . ALLERGIC RHINITIS 09/19/2009  . Asthma 09/19/2009   Lulu Riding PT, DPT, LAT, ATC  10/06/2014  9:41 AM    [facility] [address] [place], [place], [address] Phone: [phone]   Fax:  [phone]

## 2014-10-10 NOTE — Assessment & Plan Note (Signed)
Pt's PE WNL w/ exception of morbid obesity.  Pt is attempting to work on this w/ healthy diet and will resume exercise as soon as she is able.  Check labs.  Anticipatory guidance provided.

## 2014-10-11 ENCOUNTER — Ambulatory Visit: Payer: 59 | Attending: Family Medicine | Admitting: Physical Therapy

## 2014-10-11 DIAGNOSIS — R293 Abnormal posture: Secondary | ICD-10-CM | POA: Insufficient documentation

## 2014-10-11 DIAGNOSIS — R269 Unspecified abnormalities of gait and mobility: Secondary | ICD-10-CM | POA: Insufficient documentation

## 2014-10-11 DIAGNOSIS — M5386 Other specified dorsopathies, lumbar region: Secondary | ICD-10-CM

## 2014-10-11 DIAGNOSIS — R29898 Other symptoms and signs involving the musculoskeletal system: Secondary | ICD-10-CM | POA: Insufficient documentation

## 2014-10-11 DIAGNOSIS — M545 Low back pain: Secondary | ICD-10-CM | POA: Insufficient documentation

## 2014-10-11 DIAGNOSIS — M2560 Stiffness of unspecified joint, not elsewhere classified: Secondary | ICD-10-CM | POA: Diagnosis present

## 2014-10-11 NOTE — Therapy (Signed)
[facility] [address] [place], [place], [address] Phone: [phone]   Fax:  [phone]  Physical Therapy Treatment  Patient Details  Name: Amber Salas MRN: [id number] Date of Birth: Aug 06, 1975 Referring Provider:  Sheliah Hatch, MD  Encounter Date: 10/11/2014      PT End of Session - 10/11/14 1652    Visit Number 4   Number of Visits 16   Date for PT Re-Evaluation 11/15/14   PT Start Time 0431   PT Stop Time 0504   PT Time Calculation (min) 33 min      Past Medical History  Diagnosis Date  . Asthma     history of  . Retinal hole or tear     left eye- 1996 due to injury  . Obstructive sleep apnea on CPAP   . Extreme obesity   . OSA (obstructive sleep apnea) 08/30/2010  . GERD (gastroesophageal reflux disease)   . H/O hiatal hernia   . Headache(784.0)     migraines  . Hypertension   . Atypical chest pain     a. 02/2014 Cath: nl cors, EF 65-70%->d/c'd on nsaid and PPI.    Past Surgical History  Procedure Laterality Date  . Cesarean section  4/04  . Hernia repair  04/2009    abdominal hernia  . Wisdom tooth extraction  1999  . Eye surgery  11/2010    lasar repair of retinal tear  . Tubal ligation    . Esophagogastroduodenoscopy  12/30/2011    Procedure: ESOPHAGOGASTRODUODENOSCOPY (EGD);  Surgeon: [name] Dare, MD,FACG;  Location: [facility];  Service: Endoscopy;  Laterality: N/A;  . Laparoscopic total hysterectomy  03/17/13  . Abdominal hysterectomy    . Left heart catheterization with coronary angiogram N/A 03/07/2014    Procedure: LEFT HEART CATHETERIZATION WITH CORONARY ANGIOGRAM;  Surgeon: Micheline Chapman, MD;  Location: [facility] CATH LAB;  Service: Cardiovascular;  Laterality: N/A;    There were no vitals filed for this visit.  Visit Diagnosis:  Left low back pain, with sciatica presence unspecified  Decreased ROM of lumbar spine  Weakness of left leg  Joint stiffness of  spine  Abnormal posture  Abnormality of gait      Subjective Assessment - 10/11/14 1641    Subjective I had to walk quickly and jump in the car because I was scared of some squirrels in my yard. I woke up this morning in severe pain and was hardly able to move. I was getting better up until then.    Currently in Pain? Yes   Pain Score 3    Pain Location Back   Pain Orientation Mid;Lower   Pain Descriptors / Indicators Tightness;Squeezing   Pain Type Chronic pain   Pain Frequency Intermittent   Aggravating Factors  moving to fast    Pain Relieving Factors ibprofen, exercises                                 PT Education - 10/11/14 1704    Education provided Yes   Education Details Seated hip/knee strength with theraband, LAQ, hamstring curls, march, clams and ball squeeze   Person(s) Educated Patient   Methods Explanation   Comprehension Verbalized understanding          PT Short Term Goals - 10/06/14 0933    PT SHORT TERM GOAL #1   Title pt will be I with basic HEP 10/21/2014  Time 4   Period Weeks   Status On-going   PT SHORT TERM GOAL #2   Title pt will decrease low back pain to < 5/10 during and following standing for > 10-15 min to assist with exercise progression 10/21/2014   Time 4   Period Weeks   Status On-going   PT SHORT TERM GOAL #3   Title pt will increase LLE strength to > 4/5 to assist with decreasing her fear of falling 10/21/2014   Time 4   Period Weeks   Status On-going   PT SHORT TERM GOAL #4   Title pt will increase FOTO score by >10 points to assist with functional progression 10/21/2014   Period Weeks   Status On-going   PT SHORT TERM GOAL #5   Title pt will increase trunk mobility by > 10 degrees in all planes to assist with ADLs 10/21/2014   Time 4   Period Weeks   Status On-going           PT Long Term Goals - 10/11/14 1636    PT LONG TERM GOAL #1   Title pt will be I with advanced HEP 11/15/14   Time 8    Period Weeks   Status On-going   PT LONG TERM GOAL #2   Title pt will increase trunk mobility in all planes by > 20 degrees to assist with functional mobility 11/15/14   Time 8   Period Weeks   Status On-going   PT LONG TERM GOAL #3   Title pt will exhibit <3/10 pain during prolonged standing or walking for >30 min to assist with personal goal of playing with kids in water gun fight 11/15/14   Time 8   Period Weeks   Status On-going   PT LONG TERM GOAL #4   Title pt will be able to verbalize and demonstrate techniques to reduce risk or reinjury of the low back via postural awarenss, lifting and carrying mechanics, and HEp 11/15/14   Time 8   Period Weeks   Status Achieved   PT LONG TERM GOAL #5   Title pt will increase her FOTO score to > 50 to assist with her funcitonal capacity 11/15/14   Time 8   Period Weeks   Status On-going               Plan - 10/11/14 1633    Clinical Impression Statement Pt reports that until she woke up this morning in alot of pain she had noted an increase in her ability to bend over and tie her shoes. She can get her foot up on side of tub. She starts water aerobics next week. She has increased her walking distance to 2 driveways down the street and back. She can stay in grocerry store using push cart for 30 minutes without  using an electronic cart. She has also been mindful of her body mechanics and reports a decrease in pain with ADls using these stratagies. She reports her left leg pain has reduced and localized to mid thigh and higher. She still feels weakness and fear her left leg will not support her. Pt instructed in seated theraband strengthening for hips and knees with some c/o pain in low back. Offered 4 inch foot stool to encourage proper posture with exercises with pt reporting decreased pulling in low back.    PT Next Visit Plan continue repeated ext biased treatment, LE strengthening, manual for muscle tightness/spasm. modalities PRN ASSESS  RESPONSE TO LE  STRENGTH HEP         Problem List Patient Active Problem List   Diagnosis Date Noted  . Lumbar radiculopathy 07/28/2014  . Exertional chest pain 03/07/2014  . Unstable angina 03/07/2014  . Essential hypertension 03/07/2014  . Abnormal ECG 03/07/2014  . Chest pain at rest 03/07/2014  . Chest pain 02/02/2014  . HTN (hypertension) 07/04/2013  . Heart murmur, systolic 03/08/2013  . Extreme obesity 03/07/2013  . Obstructive apnea 03/07/2013  . Encounter for routine gynecological examination 06/30/2012  . Atypical chest pain 04/05/2012  . GERD (gastroesophageal reflux disease) 12/30/2011  . Other dysphagia 12/30/2011  . 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 Chronic cough 10/31/2011  . General medical examination 05/27/2011  . OSA (obstructive sleep apnea) 08/30/2010  . HIP PAIN, LEFT 03/13/2010  . Morbid obesity 10/17/2009  . ALLERGIC RHINITIS 09/19/2009  . Asthma 09/19/2009    Sherrie Mustache, PTA 10/11/2014, 5:12 PM  [facility] [address] [place], [place], [address] Phone: [phone]   Fax:  [phone]

## 2014-10-11 NOTE — Patient Instructions (Signed)
Knee Extension: Resisted (Sitting)   With band looped around right ankle and under other foot, straighten leg with ankle loop. Keep other leg bent to increase resistance. Repeat ____ times per set. Do ____ sets per session. Do ____ sessions per day.  http://orth.exer.us/690   CKnee Flexion: Resisted (Sitting)   Sit with band under left foot and looped around ankle of supported leg. Pull unsupported leg back. Repeat ____ times per set. Do ____ sets per session. Do ____ sessions per day.   FLEXION: Sitting (Active)  Sit, both feet flat. Lift left knee toward ceiling. Use red theraband.  Complete _2__ sets of _10__ repetitions. Perform __2_ sessions per day.  Abduction / Adduction  Feet hip width apart, spread thighs out, then bring thighs together. Wrap Red theraband around thighs Repeat _10__ times . Do _2__ sessions per day. Do with __red____ colored band around thighs.   Copyright  VHI. All rights reserved.  ADDUCTION: Isometric   With ball between knees, squeeze them inward. Hold __5_ seconds. Complete __2_ sets of ___10 repetitions. Perform _2__ sessions per day.  http://gtsc.exer.us/125   Copyright  VHI. All rights reserved.

## 2014-10-17 ENCOUNTER — Ambulatory Visit: Payer: 59 | Admitting: Family Medicine

## 2014-10-17 ENCOUNTER — Ambulatory Visit: Payer: 59

## 2014-10-18 ENCOUNTER — Encounter: Payer: 59 | Admitting: Physical Therapy

## 2014-10-23 ENCOUNTER — Ambulatory Visit: Payer: 59 | Admitting: Family Medicine

## 2014-10-23 ENCOUNTER — Ambulatory Visit: Payer: 59 | Admitting: Medical

## 2014-10-25 ENCOUNTER — Encounter: Payer: 59 | Admitting: Physical Therapy

## 2014-10-25 ENCOUNTER — Ambulatory Visit: Payer: 59 | Admitting: Family Medicine

## 2014-10-26 ENCOUNTER — Ambulatory Visit (INDEPENDENT_AMBULATORY_CARE_PROVIDER_SITE_OTHER): Payer: 59 | Admitting: Neurology

## 2014-10-26 ENCOUNTER — Encounter: Payer: 59 | Admitting: Neurology

## 2014-10-26 ENCOUNTER — Ambulatory Visit (INDEPENDENT_AMBULATORY_CARE_PROVIDER_SITE_OTHER): Payer: Self-pay | Admitting: Neurology

## 2014-10-26 ENCOUNTER — Encounter: Payer: Self-pay | Admitting: Neurology

## 2014-10-26 DIAGNOSIS — M25552 Pain in left hip: Secondary | ICD-10-CM

## 2014-10-26 DIAGNOSIS — M5432 Sciatica, left side: Secondary | ICD-10-CM

## 2014-10-26 NOTE — Procedures (Signed)
     HISTORY:  Amber Salas is a 39 year old patient with a history of morbid obesity. The patient has had recent onset of back pain and pain down the left leg to the foot. The patient believes that there is some weakness of the left leg. She is being evaluated for a possible lumbar radiculopathy.  NERVE CONDUCTION STUDIES:  Nerve conduction studies were performed on both lower extremities. The distal motor latencies and motor amplitudes for the peroneal and posterior tibial nerves were within normal limits. The nerve conduction velocities for these nerves were also normal. The H reflex latencies were absent bilaterally. The sensory latencies for the peroneal nerves were within normal limits.   EMG STUDIES:  EMG study was performed on the left lower extremity:  The tibialis anterior muscle reveals 2 to 4K motor units with full recruitment. No fibrillations or positive waves were seen. The peroneus tertius muscle reveals 2 to 4K motor units with full recruitment. No fibrillations or positive waves were seen. The medial gastrocnemius muscle reveals 1 to 3K motor units with full recruitment. No fibrillations or positive waves were seen. The vastus lateralis muscle reveals 2 to 4K motor units with full recruitment. No fibrillations or positive waves were seen. The iliopsoas muscle reveals 2 to 4K motor units with full recruitment. No fibrillations or positive waves were seen. The biceps femoris muscle (long head) reveals 2 to 4K motor units with full recruitment. No fibrillations or positive waves were seen. The lumbosacral paraspinal muscles were tested at 3 levels, and revealed no abnormalities of insertional activity at all 3 levels tested. There was good relaxation.   IMPRESSION:  Nerve conduction studies done on both lower extremities were relatively unremarkable, without evidence of peripheral neuropathy. EMG evaluation of the left lower extremity was unremarkable, no evidence of an  overlying lumbar radiculopathy is seen.  Marlan Palau MD 10/26/2014 4:20 PM  [facility] [address] [place], [place] [address]  Phone [phone] Fax [phone]

## 2014-10-26 NOTE — Progress Notes (Signed)
Please refer to EMG and nerve conduction study procedure note. 

## 2014-10-31 ENCOUNTER — Ambulatory Visit: Payer: 59 | Admitting: Physical Therapy

## 2014-10-31 DIAGNOSIS — R269 Unspecified abnormalities of gait and mobility: Secondary | ICD-10-CM

## 2014-10-31 DIAGNOSIS — M2560 Stiffness of unspecified joint, not elsewhere classified: Secondary | ICD-10-CM

## 2014-10-31 DIAGNOSIS — R293 Abnormal posture: Secondary | ICD-10-CM

## 2014-10-31 DIAGNOSIS — M545 Low back pain: Secondary | ICD-10-CM

## 2014-10-31 DIAGNOSIS — M5386 Other specified dorsopathies, lumbar region: Secondary | ICD-10-CM

## 2014-10-31 DIAGNOSIS — R29898 Other symptoms and signs involving the musculoskeletal system: Secondary | ICD-10-CM

## 2014-10-31 NOTE — Therapy (Signed)
[facility] [address] [place], [place], [address] Phone: [phone]   Fax:  [phone]  Physical Therapy Treatment  Patient Details  Name: Amber Salas MRN: [id number] Date of Birth: November 19, 1975 Referring Provider:  Sheliah Hatch, MD  Encounter Date: 10/31/2014      PT End of Session - 10/31/14 0920    Visit Number 5   Number of Visits 16   Date for PT Re-Evaluation 11/15/14   PT Start Time 0857   PT Stop Time 0922   PT Time Calculation (min) 25 min   Activity Tolerance Patient tolerated treatment well      Past Medical History  Diagnosis Date  . Asthma     history of  . Retinal hole or tear     left eye- 1996 due to injury  . Obstructive sleep apnea on CPAP   . Extreme obesity   . OSA (obstructive sleep apnea) 08/30/2010  . GERD (gastroesophageal reflux disease)   . H/O hiatal hernia   . Headache(784.0)     migraines  . Hypertension   . Atypical chest pain     a. 02/2014 Cath: nl cors, EF 65-70%->d/c'd on nsaid and PPI.    Past Surgical History  Procedure Laterality Date  . Cesarean section  4/04  . Hernia repair  04/2009    abdominal hernia  . Wisdom tooth extraction  1999  . Eye surgery  11/2010    lasar repair of retinal tear  . Tubal ligation    . Esophagogastroduodenoscopy  12/30/2011    Procedure: ESOPHAGOGASTRODUODENOSCOPY (EGD);  Surgeon: [name] Dare, MD,FACG;  Location: [facility];  Service: Endoscopy;  Laterality: N/A;  . Laparoscopic total hysterectomy  03/17/13  . Abdominal hysterectomy    . Left heart catheterization with coronary angiogram N/A 03/07/2014    Procedure: LEFT HEART CATHETERIZATION WITH CORONARY ANGIOGRAM;  Surgeon: Micheline Chapman, MD;  Location: [facility] CATH LAB;  Service: Cardiovascular;  Laterality: N/A;    There were no vitals filed for this visit.  Visit Diagnosis:  Left low back pain, with sciatica presence unspecified  Decreased ROM of lumbar  spine  Weakness of left leg  Joint stiffness of spine  Abnormal posture  Abnormality of gait      Subjective Assessment - 10/31/14 0858    Subjective Saw the Neurologist for NCV, "irritation" but no damage.  Took a water class, has some anxiety but is working through it.    Currently in Pain? Yes   Pain Score 2    Pain Location Back   Pain Orientation Lower;Left   Pain Descriptors / Indicators Tightness   Pain Type Chronic pain   Pain Radiating Towards no leg pain today   Pain Onset More than a month ago   Pain Frequency Intermittent            [facility] PT Assessment - 10/31/14 0926    AROM   Lumbar Flexion 70   Lumbar Extension 25   Strength   Left Knee Flexion 4-/5   Left Knee Extension 4/5          [facility] Adult PT Treatment/Exercise - 10/31/14 0906    Lumbar Exercises: Seated   Long Arc Quad on Chair Strengthening;Left;2 sets   LAQ on Chair Weights (lbs) 4   Other Seated Lumbar Exercises Seated clam x 20 red band    Other Seated Lumbar Exercises knee ext x 20 red band     Seated march x 10  each LE  [place]: activity, posture in sitting, HEP guidance   Requested to leave early to get to work.       PT Education - 10/31/14 [id number]    Education provided Yes   Education Details reviewed HEP   Person(s) Educated Patient   Methods Explanation   Comprehension Verbalized understanding          PT Short Term Goals - 10/31/14 0914    PT SHORT TERM GOAL #1   Title pt will be I with basic HEP 10/21/2014   Status Achieved   PT SHORT TERM GOAL #2   Title pt will decrease low back pain to < 5/10 during and following standing for > 10-15 min to assist with exercise progression 10/21/2014   Status Achieved   PT SHORT TERM GOAL #3   Title pt will increase LLE strength to > 4/5 to assist with decreasing her fear of falling 10/21/2014   Status On-going   PT SHORT TERM GOAL #4   Title pt will increase FOTO score by >10 points to assist with functional progression 10/21/2014    Status Unable to assess   PT SHORT TERM GOAL #5   Title pt will increase trunk mobility by > 10 degrees in all planes to assist with ADLs 10/21/2014   Status On-going           PT Long Term Goals - 10/31/14 0916    PT LONG TERM GOAL #1   Title pt will be I with advanced HEP 11/15/14   Status On-going   PT LONG TERM GOAL #2   Title pt will increase trunk mobility in all planes by > 20 degrees to assist with functional mobility 11/15/14   PT LONG TERM GOAL #3   Title pt will exhibit <3/10 pain during prolonged standing or walking for >30 min to assist with personal goal of playing with kids in water gun fight 11/15/14   Status On-going   PT LONG TERM GOAL #4   Title pt will be able to verbalize and demonstrate techniques to reduce risk or reinjury of the low back via postural awarenss, lifting and carrying mechanics, and HEp 11/15/14   Status Achieved   PT LONG TERM GOAL #5   Title pt will increase her FOTO score to > 50 to assist with her funcitonal capacity 11/15/14   Status On-going               Plan - 10/31/14 0928    Clinical Impression Statement Patient making positive changes in activity, seems to be motivated to lose weight and improve strength. Was able to stand and wash ALL the dishes the other day (normally needs multiple rest breaks for pain).  She was encouraged to continue with HEP and pool class. I with initial HEP. Wants to continue beyond 11/15/14.    PT Next Visit Plan cont to progress LE strength, modalities/manual if needed.  Prone with pillows?   PT Home Exercise Plan LE strength and posture   Consulted and Agree with Plan of Care Patient        Problem List Patient Active Problem List   Diagnosis Date Noted  . Lumbar radiculopathy 07/28/2014  . Exertional chest pain 03/07/2014  . Unstable angina 03/07/2014  . Essential hypertension 03/07/2014  . Abnormal ECG 03/07/2014  . Chest pain at rest 03/07/2014  . Chest pain 02/02/2014  . HTN (hypertension)  07/04/2013  . Heart murmur, systolic 03/08/2013  . Extreme obesity 03/07/2013  .  Obstructive apnea 03/07/2013  . Encounter for routine gynecological examination 06/30/2012  . Atypical chest pain 04/05/2012  . GERD (gastroesophageal reflux disease) 12/30/2011  . Other dysphagia 12/30/2011  . 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 Chronic cough 10/31/2011  . General medical examination 05/27/2011  . OSA (obstructive sleep apnea) 08/30/2010  . HIP PAIN, LEFT 03/13/2010  . Morbid obesity 10/17/2009  . ALLERGIC RHINITIS 09/19/2009  . Asthma 09/19/2009    Julya Alioto 10/31/2014, 9:34 AM  [facility] [address] [place], [place], [address] Phone: [phone]   Fax:  [phone]  Karie Mainland, PT 10/31/2014 9:35 AM Phone: [phone] Fax: [phone]

## 2014-11-01 ENCOUNTER — Ambulatory Visit: Payer: 59 | Admitting: Physical Therapy

## 2014-11-01 ENCOUNTER — Ambulatory Visit: Payer: 59 | Admitting: Family Medicine

## 2014-11-01 ENCOUNTER — Ambulatory Visit (INDEPENDENT_AMBULATORY_CARE_PROVIDER_SITE_OTHER): Payer: 59 | Admitting: Family Medicine

## 2014-11-01 ENCOUNTER — Encounter: Payer: Self-pay | Admitting: Family Medicine

## 2014-11-01 VITALS — BP 138/93 | HR 89 | Ht 67.0 in | Wt >= 6400 oz

## 2014-11-01 DIAGNOSIS — M5442 Lumbago with sciatica, left side: Secondary | ICD-10-CM | POA: Diagnosis not present

## 2014-11-01 NOTE — Patient Instructions (Signed)
Continue with physical therapy and home exercises. Follow up with me in 6 weeks for reevaluation.

## 2014-11-02 NOTE — Assessment & Plan Note (Signed)
History fits with lumbar radiculopathy though no source identified on MRI or nerve conduction studies.  Suspect this is more of a lumbar strain with sciatica.  She's improved - will continue with PT and home exercises, reevaluate in 6 weeks.  Advised she will likely need to do home exercises indefinitely.

## 2014-11-02 NOTE — Progress Notes (Signed)
PCP: Neena Rhymes, MD  Subjective:   HPI: Patient is a 39 y.o. female here for low back pain.  3/16: Patient reports she had a pinched nerve in her low back on left side about 7 years ago following the birth of her son. Never completely improved from this. Then pain worsened about 7 months ago. Pain radiates down into left foot with tingling. Difficulty walking as a result. Has tried heating pad, hot baths, ibuprofen, stretches. No bowel/bladder dysfunction.  4/4: Patient returns with only mild improvement from last visit. Feels pain in left side of low back going into the leg and foot with numbness. She did improve with prednisone though this recurred. Taking muscle relaxants, using heating pad. No bowel/bladder dysfunction.   5/5: Patient reports pain worsened today again to a 10/10. Radiates into left leg with numbness, tingling. No bowel/bladder dysfunction. Taking muscle relaxant, pain medication. Starts PT back next week.  6/22: Patient reports she's doing much better. Pain level down to about 2/10. Would like to continue with physical therapy. Also working out at the aquatic center in [place]. Nerve conduction studies were normal.  Past Medical History  Diagnosis Date  . Asthma     history of  . Retinal hole or tear     left eye- 1996 due to injury  . Obstructive sleep apnea on CPAP   . Extreme obesity   . OSA (obstructive sleep apnea) 08/30/2010  . GERD (gastroesophageal reflux disease)   . H/O hiatal hernia   . Headache(784.0)     migraines  . Hypertension   . Atypical chest pain     a. 02/2014 Cath: nl cors, EF 65-70%->d/c'd on nsaid and PPI.    Current Outpatient Prescriptions on File Prior to Visit  Medication Sig Dispense Refill  . albuterol (PROAIR HFA) 108 (90 BASE) MCG/ACT inhaler Inhale 2 puffs into the lungs every 4 (four) hours as needed for wheezing or shortness of breath. 3 Inhaler 3  . albuterol (PROVENTIL) (2.5 MG/3ML) 0.083%  nebulizer solution Take 3 mLs (2.5 mg total) by nebulization every 4 (four) hours as needed. For shortness of breath or wheezing 225 mL 3  . beclomethasone (QVAR) 40 MCG/ACT inhaler Inhale 2 puffs into the lungs 2 (two) times daily. Take 2 puffs first thing in the Am and then another 2 puffs about 12 hours later. 3 Inhaler 3  . cetirizine (ZYRTEC) 10 MG tablet Take 10 mg by mouth daily.    . cyclobenzaprine (FLEXERIL) 10 MG tablet Take 1 tablet (10 mg total) by mouth every 8 (eight) hours as needed. 60 tablet 1  . hydrochlorothiazide (MICROZIDE) 12.5 MG capsule Take 1 capsule (12.5 mg total) by mouth daily. (Patient not taking: Reported on 10/03/2014)    . HYDROcodone-acetaminophen (NORCO) 5-325 MG per tablet Take 1 tablet by mouth every 6 (six) hours as needed for moderate pain. (Patient not taking: Reported on 10/03/2014) 40 tablet 0  . Multiple Vitamin (MULITIVITAMIN WITH MINERALS) TABS Take 1 tablet by mouth daily.    [name] omeprazole (PRILOSEC OTC) 20 MG tablet Take 1 tablet (20 mg total) by mouth daily. (Patient not taking: Reported on 09/20/2014)    . tetrahydrozoline-zinc (VISINE-AC) 0.05-0.25 % ophthalmic solution Place 2 drops into both eyes 3 (three) times daily as needed (dry itchy eyes).    . Vitamin D, Ergocalciferol, (DRISDOL) 50000 UNITS CAPS capsule Take 1 capsule (50,000 Units total) by mouth every 7 (seven) days. 4 capsule 3   No current facility-administered medications on file  prior to visit.    Past Surgical History  Procedure Laterality Date  . Cesarean section  4/04  . Hernia repair  04/2009    abdominal hernia  . Wisdom tooth extraction  1999  . Eye surgery  11/2010    lasar repair of retinal tear  . Tubal ligation    . Esophagogastroduodenoscopy  12/30/2011    Procedure: ESOPHAGOGASTRODUODENOSCOPY (EGD);  Surgeon: [name] Dare, MD,FACG;  Location: [facility];  Service: Endoscopy;  Laterality: N/A;  . Laparoscopic total hysterectomy  03/17/13  . Abdominal hysterectomy     . Left heart catheterization with coronary angiogram N/A 03/07/2014    Procedure: LEFT HEART CATHETERIZATION WITH CORONARY ANGIOGRAM;  Surgeon: Micheline Chapman, MD;  Location: [facility] CATH LAB;  Service: Cardiovascular;  Laterality: N/A;    Allergies  Allergen Reactions  . Amlodipine     Hands turn red  . Peppermint Oil     asthma  . Fluticasone-Salmeterol Palpitations and Other (See Comments)    chest pain  . Latex Rash and Other (See Comments)    wheezing  . Spinach Rash    History   Social History  . Marital Status: Divorced    Spouse Name: N/A  . Number of Children: 3  . Years of Education: N/A   Occupational History  . CUSTOMER SERVICE [employer]   Social History Main Topics  . Smoking status: Never Smoker   . Smokeless tobacco: Never Used  . Alcohol Use: No  . Drug Use: No  . Sexual Activity: Yes    Birth Control/ Protection: Surgical   Other Topics Concern  . Not on file   Social History Narrative   Regular exercise:  Yes   Works in Clinical biochemist at [employer] [place]   Nearing end of a divorce- feels relieved.    Family History  Problem Relation Age of Onset  . Hypertension Mother   . Diabetes type II Mother   . Other Mother     cervical dysplasia  . Ovarian cancer Mother   . Hypertension Father   . Bipolar disorder Father   . Heart disease Maternal Grandmother   . Hypertension Maternal Grandmother   . Bipolar disorder Maternal Grandmother   . Hypertension Maternal Grandfather   . Hypertension Paternal Grandmother   . Hypertension Paternal Grandfather   . Hypertension Sister   . Heart attack Neg Hx   . Hyperlipidemia Neg Hx   . Sudden death Neg Hx     BP 138/93 mmHg  Pulse 89  Ht 5\' 7"  (1.702 m)  Wt 440 lb (199.583 kg)  BMI 68.90 kg/m2  LMP 06/27/2012  Review of Systems: See HPI above.    Objective:  Physical Exam:  Gen: NAD  Back: No gross deformity, scoliosis. Mild TTP L paraspinal lumbar region.  No midline or bony  TTP. FROM with minimal pain on flexion. Strength LEs 5/5 all muscle groups.   2+ MSRs in patellar and achilles tendons, equal bilaterally. Negative SLRs. Sensation diminished throughout left lower leg. Negative logroll bilateral hips    Assessment & Plan:  1. Low back pain - History fits with lumbar radiculopathy though no source identified on MRI or nerve conduction studies.  Suspect this is more of a lumbar strain with sciatica.  She's improved - will continue with PT and home exercises, reevaluate in 6 weeks.  Advised she will likely need to do home exercises indefinitely.

## 2014-11-06 ENCOUNTER — Telehealth: Payer: Self-pay | Admitting: Family Medicine

## 2014-11-06 DIAGNOSIS — G4733 Obstructive sleep apnea (adult) (pediatric): Secondary | ICD-10-CM

## 2014-11-06 NOTE — Telephone Encounter (Signed)
Per last CPE, PCP advised pt if sleep study was needed we would place a referral. Want her to go to Pulm? Dx. OSA on CPAP?

## 2014-11-06 NOTE — Telephone Encounter (Signed)
Ok for referral to pulm (has seen Dr Craige CottaSood)  Dx OSA

## 2014-11-06 NOTE — Telephone Encounter (Signed)
Referral placed.

## 2014-11-06 NOTE — Telephone Encounter (Signed)
,  Caller name: [name] Relationship to patient: SELF Can be [phone] Pharmacy:  Reason for call: PATIENT STATES DR Beverely Low TOLD HER WHEN SHE WAS READY FOR ANOTHER SLEPP STUDY TO GIVE DR TABORI A CALL AND SHE WOULD PUT IN THE REFERRAL

## 2014-11-08 ENCOUNTER — Ambulatory Visit: Payer: 59 | Admitting: Physical Therapy

## 2014-11-08 DIAGNOSIS — M2560 Stiffness of unspecified joint, not elsewhere classified: Secondary | ICD-10-CM

## 2014-11-08 DIAGNOSIS — R29898 Other symptoms and signs involving the musculoskeletal system: Secondary | ICD-10-CM

## 2014-11-08 DIAGNOSIS — M545 Low back pain: Secondary | ICD-10-CM | POA: Diagnosis not present

## 2014-11-08 DIAGNOSIS — M5386 Other specified dorsopathies, lumbar region: Secondary | ICD-10-CM

## 2014-11-08 NOTE — Therapy (Signed)
[facility] [address] [place], [place], [address] Phone: [phone]   Fax:  [phone]  Physical Therapy Treatment  Patient Details  Name: Amber Salas MRN: [id number] Date of Birth: 06/30/1975 Referring Provider:  Sheliah Hatchabori, Katherine E, MD  Encounter Date: 11/08/2014      PT End of Session - 11/08/14 1231    Visit Number 6   Number of Visits 16   Date for PT Re-Evaluation 11/15/14   PT Start Time 0950   PT Stop Time 1031   PT Time Calculation (min) 41 min   Activity Tolerance Patient tolerated treatment well   Behavior During Therapy [facility] for tasks assessed/performed      Past Medical History  Diagnosis Date  . Asthma     history of  . Retinal hole or tear     left eye- 1996 due to injury  . Obstructive sleep apnea on CPAP   . Extreme obesity   . OSA (obstructive sleep apnea) 08/30/2010  . GERD (gastroesophageal reflux disease)   . H/O hiatal hernia   . Headache(784.0)     migraines  . Hypertension   . Atypical chest pain     a. 02/2014 Cath: nl cors, EF 65-70%->d/c'd on nsaid and PPI.    Past Surgical History  Procedure Laterality Date  . Cesarean section  4/04  . Hernia repair  04/2009    abdominal hernia  . Wisdom tooth extraction  1999  . Eye surgery  11/2010    lasar repair of retinal tear  . Tubal ligation    . Esophagogastroduodenoscopy  12/30/2011    Procedure: ESOPHAGOGASTRODUODENOSCOPY (EGD);  Surgeon: [name] DareMalcolm T Stark, MD,FACG;  [facility];  Service: Endoscopy;  Laterality: N/A;  . Laparoscopic total hysterectomy  03/17/13  . Abdominal hysterectomy    . Left heart catheterization with coronary angiogram N/A 03/07/2014    Procedure: LEFT HEART CATHETERIZATION WITH CORONARY ANGIOGRAM;  Surgeon: Micheline ChapmanMichael D Cooper, MD;  [facility];  Service: Cardiovascular;  Laterality: N/A;    There were no vitals filed for this visit.  Visit Diagnosis:  Left low back pain, with  sciatica presence unspecified  Weakness of left leg  Decreased ROM of lumbar spine  Joint stiffness of spine      Subjective Assessment - 11/08/14 0957    Subjective Shorter due to water main break,  5/10 with leg pain to foot.  Pool 2 days a week.  has leg pain after exercise in pool if she rushes.     Currently in Pain? Yes   Pain Score 5    Pain Location Back   Pain Orientation Left;Lower   Pain Descriptors / Indicators Shooting;Tightness   Pain Radiating Towards foot   Pain Frequency Intermittent   Aggravating Factors  moving too fast   Pain Relieving Factors Acetaminophen 650 MG.  extension, back support at desk, allowing more time to get somewhere,                         [facility] Adult PT Treatment/Exercise - 11/08/14 0950    Lumbar Exercises: Stretches   Prone on Elbows Stretch 60 seconds;3 reps   Press Ups 5 reps   Lumbar Exercises: Prone   Other Prone Lumbar Exercises multifitus, multifitus with knee flexion(active now) and multifitus with SLR Rt 5 reps with cues.     Moist Heat Therapy   Number Minutes Moist Heat 15 Minutes   Moist Heat  Location Lumbar Spine  prone                  PT Short Term Goals - 10/31/14 0914    PT SHORT TERM GOAL #1   Title pt will be I with basic HEP 10/21/2014   Status Achieved   PT SHORT TERM GOAL #2   Title pt will decrease low back pain to < 5/10 during and following standing for > 10-15 min to assist with exercise progression 10/21/2014   Status Achieved   PT SHORT TERM GOAL #3   Title pt will increase LLE strength to > 4/5 to assist with decreasing her fear of falling 10/21/2014   Status On-going   PT SHORT TERM GOAL #4   Title pt will increase FOTO score by >10 points to assist with functional progression 10/21/2014   Status Unable to assess   PT SHORT TERM GOAL #5   Title pt will increase trunk mobility by > 10 degrees in all planes to assist with ADLs 10/21/2014   Status On-going           PT  Long Term Goals - 10/31/14 0916    PT LONG TERM GOAL #1   Title pt will be I with advanced HEP 11/15/14   Status On-going   PT LONG TERM GOAL #2   Title pt will increase trunk mobility in all planes by > 20 degrees to assist with functional mobility 11/15/14   PT LONG TERM GOAL #3   Title pt will exhibit <3/10 pain during prolonged standing or walking for >30 min to assist with personal goal of playing with kids in water gun fight 11/15/14   Status On-going   PT LONG TERM GOAL #4   Title pt will be able to verbalize and demonstrate techniques to reduce risk or reinjury of the low back via postural awarenss, lifting and carrying mechanics, and HEp 11/15/14   Status Achieved   PT LONG TERM GOAL #5   Title pt will increase her FOTO score to > 50 to assist with her funcitonal capacity 11/15/14   Status On-going               Plan - 11/08/14 1231    Clinical Impression Statement Patient was working throught the pain even with radiation.  Hopefully she will try to centralize pain sooner vs after the fact. Now able to flex Lt knee in prone. shorter session due to watermaine break.   PT Next Visit Plan cont to progress LE strength, modalities/manual if needed.  Prone with pillows?  multifitus work.   PT Home Exercise Plan avoid leg pain with extension stretch.   Consulted and Agree with Plan of Care Patient        Problem List Patient Active Problem List   Diagnosis Date Noted  . Low back pain with left-sided sciatica 07/28/2014  . Exertional chest pain 03/07/2014  . Unstable angina 03/07/2014  . Essential hypertension 03/07/2014  . Abnormal ECG 03/07/2014  . Chest pain at rest 03/07/2014  . Chest pain 02/02/2014  . HTN (hypertension) 07/04/2013  . Heart murmur, systolic 03/08/2013  . Extreme obesity 03/07/2013  . Obstructive apnea 03/07/2013  . Encounter for routine gynecological examination 06/30/2012  . Atypical chest pain 04/05/2012  . GERD (gastroesophageal reflux disease)  12/30/2011  . Other dysphagia 12/30/2011  . 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 Chronic cough 10/31/2011  . General medical examination 05/27/2011  . OSA (obstructive sleep apnea) 08/30/2010  . HIP  PAIN, LEFT 03/13/2010  . Morbid obesity 10/17/2009  . ALLERGIC RHINITIS 09/19/2009  . Asthma 09/19/2009    HARRIS,KAREN 11/08/2014, 12:52 PM  [facility] [address] [place], [place], [address] Phone: [phone]   Fax:  [phone]     Liz Beach, PTA 11/08/2014 12:52 PM Phone: [phone] Fax: [phone]

## 2014-11-08 NOTE — Patient Instructions (Signed)
Red light/green light concept for pain.  If activity does not increase area of pain, green light.  If activity increases area of pain for example : moving from low back into gluteal, red light.  Stop activity and stretch into extension, change position etc until pain decreases in area. Expect increased back pain when centralized.

## 2014-11-15 ENCOUNTER — Ambulatory Visit: Payer: 59 | Attending: Family Medicine | Admitting: Physical Therapy

## 2014-11-15 DIAGNOSIS — R29898 Other symptoms and signs involving the musculoskeletal system: Secondary | ICD-10-CM | POA: Insufficient documentation

## 2014-11-15 DIAGNOSIS — M2560 Stiffness of unspecified joint, not elsewhere classified: Secondary | ICD-10-CM | POA: Insufficient documentation

## 2014-11-15 DIAGNOSIS — R269 Unspecified abnormalities of gait and mobility: Secondary | ICD-10-CM | POA: Diagnosis present

## 2014-11-15 DIAGNOSIS — M5386 Other specified dorsopathies, lumbar region: Secondary | ICD-10-CM

## 2014-11-15 DIAGNOSIS — M545 Low back pain: Secondary | ICD-10-CM | POA: Insufficient documentation

## 2014-11-15 DIAGNOSIS — R293 Abnormal posture: Secondary | ICD-10-CM | POA: Insufficient documentation

## 2014-11-15 NOTE — Therapy (Signed)
[facility] [address] [place], [place], [address] Phone: [phone]   Fax:  [phone]  Physical Therapy Treatment  Patient Details  Name: Amber Salas MRN: [id number] Date of Birth: [birth year] Referring Provider:  Sheliah Hatch, MD  Encounter Date: 11/15/2014      PT End of Session - 11/15/14 1732    Activity Tolerance Patient limited by pain   Behavior During Therapy [facility] for tasks assessed/performed      Past Medical History  Diagnosis Date  . Asthma     history of  . Retinal hole or tear     left eye- 1996 due to injury  . Obstructive sleep apnea on CPAP   . Extreme obesity   . OSA (obstructive sleep apnea) 08/30/2010  . GERD (gastroesophageal reflux disease)   . H/O hiatal hernia   . Headache(784.0)     migraines  . Hypertension   . Atypical chest pain     a. 02/2014 Cath: nl cors, EF 65-70%->d/c'd on nsaid and PPI.    Past Surgical History  Procedure Laterality Date  . Cesarean section  4/04  . Hernia repair  04/2009    abdominal hernia  . Wisdom tooth extraction  1999  . Eye surgery  11/2010    lasar repair of retinal tear  . Tubal ligation    . Esophagogastroduodenoscopy  12/30/2011    Procedure: ESOPHAGOGASTRODUODENOSCOPY (EGD);  Surgeon: [name] Dare, MD,FACG;  [facility];  Service: Endoscopy;  Laterality: N/A;  . Laparoscopic total hysterectomy  03/17/13  . Abdominal hysterectomy    . Left heart catheterization with coronary angiogram N/A 03/07/2014    Procedure: LEFT HEART CATHETERIZATION WITH CORONARY ANGIOGRAM;  Surgeon: Micheline Chapman, MD;  Location: [facility] C[facility] LAB;  Service: Cardiovascular;  Laterality: N/A;    There were no vitals filed for this visit.  Visit Diagnosis:  Left low back pain, with sciatica presence unspecified  Weakness of left leg  Decreased ROM of lumbar spine  Joint stiffness of spine      Subjective Assessment - 11/15/14 1653    Subjective Woke up hurting.  was 10/10 1 hour ago.  Medication Tylenol arthritis 650 MG 2 caps. Had to use stairs vs elevator last Friday,  Needed to use cane for gait on Monday.                           [facility] Adult PT Treatment/Exercise - 11/15/14 1652    Lumbar Exercises: Stretches   Standing Extension 5 reps  intermittantly during session to help centralize pain.     Standing Extension Limitations Also done with a step forward prior to stretching   Lumbar Exercises: Seated   Long Arc Quad on Therapist, art on Chair Weights (lbs) 3   Other Seated Lumbar Exercises Seated with abdominals engaged, hip flexion 10 reps each   Other Seated Lumbar Exercises knee flexion, red band 10 X each   Knee/Hip Exercises: Standing   Heel Raises 10 reps  both   Abduction Limitations 2 X patient did not trust her Lt leg to bear weight. Tried lateral weight shifts in parallel bars but stopped due to pain.   Hip Extension 10 reps   Extension Limitations taps toe to floor                  PT Short Term Goals - 10/31/14 0914    PT SHORT  TERM GOAL #1   Title pt will be I with basic HEP 10/21/2014   Status Achieved   PT SHORT TERM GOAL #2   Title pt will decrease low back pain to < 5/10 during and following standing for > 10-15 min to assist with exercise progression 10/21/2014   Status Achieved   PT SHORT TERM GOAL #3   Title pt will increase LLE strength to > 4/5 to assist with decreasing her fear of falling 10/21/2014   Status On-going   PT SHORT TERM GOAL #4   Title pt will increase FOTO score by >10 points to assist with functional progression 10/21/2014   Status Unable to assess   PT SHORT TERM GOAL #5   Title pt will increase trunk mobility by > 10 degrees in all planes to assist with ADLs 10/21/2014   Status On-going           PT Long Term Goals - 10/31/14 0916    PT LONG TERM GOAL #1   Title pt will be I with advanced HEP 11/15/14   Status On-going    PT LONG TERM GOAL #2   Title pt will increase trunk mobility in all planes by > 20 degrees to assist with functional mobility 11/15/14   PT LONG TERM GOAL #3   Title pt will exhibit <3/10 pain during prolonged standing or walking for >30 min to assist with personal goal of playing with kids in water gun fight 11/15/14   Status On-going   PT LONG TERM GOAL #4   Title pt will be able to verbalize and demonstrate techniques to reduce risk or reinjury of the low back via postural awarenss, lifting and carrying mechanics, and HEp 11/15/14   Status Achieved   PT LONG TERM GOAL #5   Title pt will increase her FOTO score to > 50 to assist with her funcitonal capacity 11/15/14   Status On-going               Plan - 11/15/14 1733    Clinical Impression Statement Pain today at end of session, 4/10  .  she declined prone exercises and some standing strengthening in parallel bars and at wall due to not trusting what her Lt leg would do.  I suggested using her cane for safety..  She really tries to use good posture at home and work.    PT Next Visit Plan cont to progress LE strength, modalities/manual if needed.  Prone with pillows?  multifitus work.   Consulted and Agree with Plan of Care Patient        Problem List Patient Active Problem List   Diagnosis Date Noted  . Low back pain with left-sided sciatica 07/28/2014  . Exertional chest pain 03/07/2014  . Unstable angina 03/07/2014  . Essential hypertension 03/07/2014  . Abnormal ECG 03/07/2014  . Chest pain at rest 03/07/2014  . Chest pain 02/02/2014  . HTN (hypertension) 07/04/2013  . Heart murmur, systolic 03/08/2013  . Extreme obesity 03/07/2013  . Obstructive apnea 03/07/2013  . Encounter for routine gynecological examination 06/30/2012  . Atypical chest pain 04/05/2012  . GERD (gastroesophageal reflux disease) 12/30/2011  . Other dysphagia 12/30/2011  . 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 Chronic cough 10/31/2011  . General medical  examination 05/27/2011  . OSA (obstructive sleep apnea) 08/30/2010  . HIP PAIN, LEFT 03/13/2010  . Morbid obesity 10/17/2009  . ALLERGIC RHINITIS 09/19/2009  . Asthma 09/19/2009    HARRIS,KAREN 11/15/2014, 5:37 PM  [place]  [facility] [address] [place], [place], [address] Phone: [phone]   Fax:  [phone]     Liz Beach, PTA 11/15/2014 5:37 PM Phone: [phone] Fax: [phone]

## 2014-11-20 ENCOUNTER — Ambulatory Visit: Payer: 59 | Admitting: Physical Therapy

## 2014-11-20 DIAGNOSIS — R269 Unspecified abnormalities of gait and mobility: Secondary | ICD-10-CM

## 2014-11-20 DIAGNOSIS — M5386 Other specified dorsopathies, lumbar region: Secondary | ICD-10-CM

## 2014-11-20 DIAGNOSIS — M2560 Stiffness of unspecified joint, not elsewhere classified: Secondary | ICD-10-CM

## 2014-11-20 DIAGNOSIS — R29898 Other symptoms and signs involving the musculoskeletal system: Secondary | ICD-10-CM

## 2014-11-20 DIAGNOSIS — M545 Low back pain: Secondary | ICD-10-CM | POA: Diagnosis not present

## 2014-11-20 DIAGNOSIS — R293 Abnormal posture: Secondary | ICD-10-CM

## 2014-11-20 NOTE — Therapy (Signed)
[facility] [address] [place], [place], [address] Phone: [phone]   Fax:  [phone]  Physical Therapy Treatment  Patient Details  Name: Amber Salas MRN: [id number] Date of Birth: 22-Feb-1976 Referring Provider:  Sheliah Hatch, MD  Encounter Date: 11/20/2014      PT End of Session - 11/20/14 0925    Visit Number 8   Number of Visits 16   Date for PT Re-Evaluation 12/18/14   PT Start Time 0833   PT Stop Time 0926   PT Time Calculation (min) 53 min   Activity Tolerance Patient tolerated treatment well   Behavior During Therapy [facility] for tasks assessed/performed      Past Medical History  Diagnosis Date  . Asthma     history of  . Retinal hole or tear     left eye- 1996 due to injury  . Obstructive sleep apnea on CPAP   . Extreme obesity   . OSA (obstructive sleep apnea) 08/30/2010  . GERD (gastroesophageal reflux disease)   . H/O hiatal hernia   . Headache(784.0)     migraines  . Hypertension   . Atypical chest pain     a. 02/2014 Cath: nl cors, EF 65-70%->d/c'd on nsaid and PPI.    Past Surgical History  Procedure Laterality Date  . Cesarean section  4/04  . Hernia repair  04/2009    abdominal hernia  . Wisdom tooth extraction  1999  . Eye surgery  11/2010    lasar repair of retinal tear  . Tubal ligation    . Esophagogastroduodenoscopy  12/30/2011    Procedure: ESOPHAGOGASTRODUODENOSCOPY (EGD);  Surgeon: [name] Dare, MD,FACG;  [facility];  Service: Endoscopy;  Laterality: N/A;  . Laparoscopic total hysterectomy  03/17/13  . Abdominal hysterectomy    . Left heart catheterization with coronary angiogram N/A 03/07/2014    Procedure: LEFT HEART CATHETERIZATION WITH CORONARY ANGIOGRAM;  Surgeon: Micheline Chapman, MD;  Location: [facility] C[facility] LAB;  Service: Cardiovascular;  Laterality: N/A;    There were no vitals filed for this visit.  Visit Diagnosis:  Left low back pain, with  sciatica presence unspecified - Plan: PT plan of care cert/re-cert  Weakness of left leg - Plan: PT plan of care cert/re-cert  Decreased ROM of lumbar spine - Plan: PT plan of care cert/re-cert  Joint stiffness of spine - Plan: PT plan of care cert/re-cert  Abnormal posture - Plan: PT plan of care cert/re-cert  Abnormality of gait - Plan: PT plan of care cert/re-cert      Subjective Assessment - 11/20/14 0839    Subjective "The back is getting better but it hasn't completely gone, some days are harder than others"   Currently in Pain? Yes   Pain Score 3   took Ibuprofen at 7 AM   Pain Location Back   Pain Orientation Left;Lower   Pain Descriptors / Indicators Aching;Tightness;Shooting   Pain Type Chronic pain   Pain Onset More than a month ago   Pain Frequency Intermittent   Aggravating Factors  moving too fast, getting stuff out of the dryer   Pain Relieving Factors heat meds.            [facility] PT Assessment - 11/20/14 0001    Assessment   Medical Diagnosis L low back pain with LLE sciatic   Onset Date/Surgical Date --  7 years ago   Prior Therapy no   Precautions   Precautions None  Restrictions   Weight Bearing Restrictions No   Balance Screen   Has the patient fallen in the past 6 months Yes   How many times? 1   Has the patient had a decrease in activity level because of a fear of falling?  Yes   Is the patient reluctant to leave their home because of a fear of falling?  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   Leisure hangout with family   Cognition   Overall Cognitive Status Within Functional Limits for tasks assessed   Observation/Other Assessments   Focus on Therapeutic Outcomes (FOTO)  58% limited   AROM   Lumbar Flexion 68   Lumbar Extension 20   Lumbar - Right Side Bend 33   Lumbar - Left Side Bend 25   Strength   Right Hip Flexion 4+/5   Right Hip Extension 4+/5   Right Hip ABduction 4+/5   Right Hip ADduction 4/5   Left Hip Flexion 4/5   Left Hip Extension 3+/5   Left Hip ABduction 4+/5   Left Hip ADduction 4/5   Right Knee Flexion 4+/5   Right Knee Extension 5/5   Left Knee Flexion 4/5   Left Knee Extension 4/5                     [facility] Adult PT Treatment/Exercise - 11/20/14 0001    Lumbar Exercises: Standing   Other Standing Lumbar Exercises repeated standing extension 2 x 20  VC to ease into exercise and progress   Lumbar Exercises: Seated   Sit to Stand 5 reps  from elevated table   Other Seated Lumbar Exercises sciatic nerve flossing x 15  Neck, extension with knee ext/foot DF, and reverse    Lumbar Exercises: Supine   Bent Knee Raise 15 reps;1 second  x2, VC for transverse ab contraction   Knee/Hip Exercises: Stretches   Gastroc Stretch 2 reps;30 seconds   Soleus Stretch 2 reps;30 seconds                PT Education - 11/20/14 0925    Education provided Yes   Education Details added exercises to HEP, educated about posture    Person(s) Educated Patient   Methods Explanation   Comprehension Verbalized understanding          PT Short Term Goals - 11/20/14 0930    PT SHORT TERM GOAL #1   Title pt will be I with basic HEP 10/21/2014   Time 4   Period Weeks   Status Achieved   PT SHORT TERM GOAL #2   Title pt will decrease low back pain to < 5/10 during and following standing for > 10-15 min to assist with  exercise progression 10/21/2014   Time 4   Period Weeks   Status Achieved   PT SHORT TERM GOAL #3   Title pt will increase LLE strength to > 4/5 to assist  with decreasing her fear of falling 10/21/2014   Time 4   Period Weeks   Status On-going   PT SHORT TERM GOAL #4   Title pt will increase FOTO score by >10 points to assist with functional progression 10/21/2014   Time 4   Period Weeks   Status On-going   PT SHORT TERM GOAL #5   Title pt will increase trunk mobility by > 10 degrees in all planes to assist with ADLs 10/21/2014   Time 4   Period Weeks   Status On-going           PT Long Term Goals - 11/20/14 0931    PT LONG TERM GOAL #1   Title pt will be I with advanced HEP 11/15/14   Time 8   Period Weeks   Status On-going   PT LONG TERM GOAL #2   Title pt will increase trunk mobility in all planes by > 20 degrees to assist with functional mobility 11/15/14   Time 8   Period Weeks   Status On-going   PT LONG TERM GOAL #3   Title pt will exhibit <3/10 pain during prolonged standing or walking for >30 min to assist with personal goal of playing with kids in water gun fight 11/15/14   Time 8   Period Weeks   Status On-going   PT LONG TERM GOAL #4   Title pt will be able to verbalize and demonstrate techniques to reduce risk or reinjury of the low back via postural awarenss, lifting and carrying mechanics, and HEp 11/15/14   Time 8   Period Weeks   Status Achieved   PT LONG TERM GOAL #5   Title pt will increase her FOTO score to > 50 to assist with her funcitonal capacity 11/15/14   Time 8   Period Weeks   Status On-going               Plan - 11/20/14 0926    Clinical Impression Statement Amber Salas continues to make progess wit decreased pain rated at 3/10 today and improved trunk mobility. No new goals have been met this visit. She continues to demonstrate shooting pain to her L foot, and weakness in the LLE compared bil. pt discussed about losing weight to help support  her back. educated about posture and progressed repeated trunk extensions to stand, and added sciatic nerve flossing to help decrease radiating symptoms. Plan to continue with POC .   Pt will benefit from skilled therapeutic intervention in order to improve on the following deficits Abnormal gait;Obesity;Pain;Improper body mechanics;Postural dysfunction;Impaired flexibility;Difficulty walking;Decreased range of motion;Decreased activity tolerance;Decreased mobility;Decreased strength;Increased muscle spasms;Decreased balance;Decreased endurance;Decreased safety awareness;Increased fascial restricitons   Rehab Potential Good   PT Frequency 2x / week   PT Duration 4 weeks   PT Treatment/Interventions ADLs/Self Care Home Management;Moist Heat;Therapeutic activities;Patient/family education;Therapeutic exercise;Passive range of motion;Ultrasound;Gait training;Balance training;Manual techniques;Dry needling;Cryotherapy;Neuromuscular re-education;Electrical Stimulation   PT Next Visit Plan cont to progress LE strength, modalities/manual if needed.   multifitus work. core strengthening, sciatic nerve gliding,    PT Home Exercise Plan standing extension ( after any trunk flexion activity), sit to stands, sciatic nerve floss, bent knee marching with abdominal draw in manuever   Consulted and Agree with Plan of Care Patient        Problem List Patient Active Problem List   Diagnosis Date Noted  . Low back pain with left-sided sciatica 07/28/2014  . Exertional chest pain 03/07/2014  . Unstable angina 03/07/2014  .  Essential hypertension 03/07/2014  . Abnormal ECG 03/07/2014  . Chest pain at rest 03/07/2014  . Chest pain 02/02/2014  . HTN (hypertension) 07/04/2013  . Heart murmur, systolic 16/02/9603  . Extreme obesity 03/07/2013  . Obstructive apnea 03/07/2013  . Encounter for routine gynecological examination 06/30/2012  . Atypical chest pain 04/05/2012  . GERD (gastroesophageal reflux disease)  12/30/2011  . Other dysphagia 12/30/2011  . 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 Chronic cough 10/31/2011  . General medical examination 05/27/2011  . OSA (obstructive sleep apnea) 08/30/2010  . HIP PAIN, LEFT 03/13/2010  . Morbid obesity 10/17/2009  . ALLERGIC RHINITIS 09/19/2009  . Asthma 09/19/2009   Starr Lake PT, DPT, LAT, ATC  11/20/2014  9:41 AM     [facility] [address] [place], [place], [address] Phone: [phone]   Fax:  [phone]

## 2014-11-20 NOTE — Patient Instructions (Signed)
   Isyss Espinal PT, DPT, LAT, ATC  [facility] Phone: [phone]     

## 2014-11-22 ENCOUNTER — Ambulatory Visit: Payer: 59 | Admitting: Physical Therapy

## 2014-11-22 ENCOUNTER — Telehealth: Payer: Self-pay | Admitting: Family Medicine

## 2014-11-22 DIAGNOSIS — M5386 Other specified dorsopathies, lumbar region: Secondary | ICD-10-CM

## 2014-11-22 DIAGNOSIS — M2560 Stiffness of unspecified joint, not elsewhere classified: Secondary | ICD-10-CM

## 2014-11-22 DIAGNOSIS — M545 Low back pain: Secondary | ICD-10-CM

## 2014-11-22 DIAGNOSIS — R293 Abnormal posture: Secondary | ICD-10-CM

## 2014-11-22 DIAGNOSIS — R29898 Other symptoms and signs involving the musculoskeletal system: Secondary | ICD-10-CM

## 2014-11-22 NOTE — Telephone Encounter (Signed)
Pt notified that placard application will be available at the front desk for pick up.

## 2014-11-22 NOTE — Telephone Encounter (Signed)
Spoke to patient and advised that we do not renew temporary handicap placards. If still wanted one, advised her to speak with PCP.

## 2014-11-22 NOTE — Telephone Encounter (Signed)
Ok for Handicap placard (dx lumbar pain w/ radiculopathy)

## 2014-11-22 NOTE — Telephone Encounter (Signed)
Caller name: yolanda Relation to pt: self Call back number: [phone] Pharmacy:  Reason for call:   Patient states that she is needing a new handicap placard. Patient states that she was seen by ortho regarding back pain but that office told her to call our office.

## 2014-11-22 NOTE — Telephone Encounter (Signed)
It's temporary so it lasts for 3 months to get her through current treatment.  We do not renew them.

## 2014-11-22 NOTE — Therapy (Signed)
[facility] Outpatient Rehabilitation [facility] [address] [place], [place], [address] Phone: [phone]   Fax:  [phone]  Physical Therapy Treatment  Patient Details  Name: Amber Salas MRN: [id number] Date of Birth: 1976/04/01 Referring Provider:  Sheliah Hatch, MD  Encounter Date: 11/22/2014 Visit 9 Number of visits 16 Date of re-eval 12/18/14     PT End of Session - 11/22/14 1113    PT Start Time 0845   PT Stop Time 0946   PT Time Calculation (min) 61 min   Activity Tolerance Patient tolerated treatment well   Behavior During Therapy [facility] for tasks assessed/performed      Past Medical History  Diagnosis Date  . Asthma     history of  . Retinal hole or tear     left eye- 1996 due to injury  . Obstructive sleep apnea on CPAP   . Extreme obesity   . OSA (obstructive sleep apnea) 08/30/2010  . GERD (gastroesophageal reflux disease)   . H/O hiatal hernia   . Headache(784.0)     migraines  . Hypertension   . Atypical chest pain     a. 02/2014 Cath: nl cors, EF 65-70%->d/c'd on nsaid and PPI.    Past Surgical History  Procedure Laterality Date  . Cesarean section  4/04  . Hernia repair  04/2009    abdominal hernia  . Wisdom tooth extraction  1999  . Eye surgery  11/2010    lasar repair of retinal tear  . Tubal ligation    . Esophagogastroduodenoscopy  12/30/2011    Procedure: ESOPHAGOGASTRODUODENOSCOPY (EGD);  Surgeon: [name] Dare, MD,FACG;  Location: [facility];  Service: Endoscopy;  Laterality: N/A;  . Laparoscopic total hysterectomy  03/17/13  . Abdominal hysterectomy    . Left heart catheterization with coronary angiogram N/A 03/07/2014    Procedure: LEFT HEART CATHETERIZATION WITH CORONARY ANGIOGRAM;  Surgeon: Micheline Chapman, MD;  Location: [facility] CATH LAB;  Service: Cardiovascular;  Laterality: N/A;    There were no vitals filed for this visit.  Visit Diagnosis:  Weakness of left leg  Left low back pain, with sciatica  presence unspecified  Decreased ROM of lumbar spine  Joint stiffness of spine  Abnormal posture      Subjective Assessment - 11/22/14 1045    Subjective 4/10 no meds.  has allergies and does not feel well.                         [facility] Adult PT Treatment/Exercise - 11/22/14 0905    Self-Care   Other Self-Care Comments  skeleton used to answer questions about sciatic nerve , posture and stretches    Lumbar Exercises: Stretches   Standing Extension 3 reps  5 seconds.     Lumbar Exercises: Seated   LAQ on Chair Limitations Sciatic nerve 10- stretch cues for small uncomfortable sensation, decreased motionto dec   Sit to Stand --  7 reps, with visualization, able to do without hands   Knee/Hip Exercises: Stretches   Soleus Stretch 2 reps;30 seconds   Soleus Stretch Limitations cues for position given.  shakey   Moist Heat Therapy   Number Minutes Moist Heat 15 Minutes   Moist Heat Location Lumbar Spine  prone.                PT Education - 11/22/14 1113    Education Details sit to stand, posture. anatomy,    Person(s) Educated Patient  Methods Explanation;Demonstration;Verbal cues   Comprehension Verbalized understanding          PT Short Term Goals - 11/20/14 0930    PT SHORT TERM GOAL #1   Title pt will be I with basic HEP 10/21/2014   Time 4   Period Weeks   Status Achieved   PT SHORT TERM GOAL #2   Title pt will decrease low back pain to < 5/10 during and following standing for > 10-15 min to assist with exercise progression 10/21/2014   Time 4   Period Weeks   Status Achieved   PT SHORT TERM GOAL #3   Title pt will increase LLE strength to > 4/5 to assist with decreasing her fear of falling 10/21/2014   Time 4   Period Weeks   Status On-going   PT SHORT TERM GOAL #4   Title pt will increase FOTO score by >10 points to assist with functional progression 10/21/2014   Time 4   Period Weeks   Status On-going   PT SHORT TERM GOAL #5    Title pt will increase trunk mobility by > 10 degrees in all planes to assist with ADLs 10/21/2014   Time 4   Period Weeks   Status On-going           PT Long Term Goals - 11/20/14 0931    PT LONG TERM GOAL #1   Title pt will be I with advanced HEP 11/15/14   Time 8   Period Weeks   Status On-going   PT LONG TERM GOAL #2   Title pt will increase trunk mobility in all planes by > 20 degrees to assist with functional mobility 11/15/14   Time 8   Period Weeks   Status On-going   PT LONG TERM GOAL #3   Title pt will exhibit <3/10 pain during prolonged standing or walking for >30 min to assist with personal goal of playing with kids in water gun fight 11/15/14   Time 8   Period Weeks   Status On-going   PT LONG TERM GOAL #4   Title pt will be able to verbalize and demonstrate techniques to reduce risk or reinjury of the low back via postural awarenss, lifting and carrying mechanics, and HEp 11/15/14   Time 8   Period Weeks   Status Achieved   PT LONG TERM GOAL #5   Title pt will increase her FOTO score to > 50 to assist with her funcitonal capacity 11/15/14   Time 8   Period Weeks   Status On-going               Plan - 11/22/14 1117    PT Next Visit Plan FOTO  10th visit.  Try pallof press, wall push up for core.  hip strengthening.   Consulted and Agree with Plan of Care Patient        Problem List Patient Active Problem List   Diagnosis Date Noted  . Low back pain with left-sided sciatica 07/28/2014  . Exertional chest pain 03/07/2014  . Unstable angina 03/07/2014  . Essential hypertension 03/07/2014  . Abnormal ECG 03/07/2014  . Chest pain at rest 03/07/2014  . Chest pain 02/02/2014  . HTN (hypertension) 07/04/2013  . Heart murmur, systolic 03/08/2013  . Extreme obesity 03/07/2013  . Obstructive apnea 03/07/2013  . Encounter for routine gynecological examination 06/30/2012  . Atypical chest pain 04/05/2012  . GERD (gastroesophageal reflux disease)  12/30/2011  . Other dysphagia 12/30/2011  .  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 Chronic cough 10/31/2011  . General medical examination 05/27/2011  . OSA (obstructive sleep apnea) 08/30/2010  . HIP PAIN, LEFT 03/13/2010  . Morbid obesity 10/17/2009  . ALLERGIC RHINITIS 09/19/2009  . Asthma 09/19/2009    HARRIS,KAREN 11/22/2014, 11:47 AM  [facility] [address] [place], [place], [address] Phone: [phone]   Fax:  [phone]     Liz Beach, PTA 11/22/2014 11:47 AM Phone: [phone] Fax: [phone]

## 2014-11-27 ENCOUNTER — Ambulatory Visit: Payer: 59 | Admitting: Physical Therapy

## 2014-11-30 ENCOUNTER — Ambulatory Visit: Payer: 59 | Admitting: Physical Therapy

## 2014-11-30 DIAGNOSIS — R293 Abnormal posture: Secondary | ICD-10-CM

## 2014-11-30 DIAGNOSIS — M2560 Stiffness of unspecified joint, not elsewhere classified: Secondary | ICD-10-CM

## 2014-11-30 DIAGNOSIS — M545 Low back pain: Secondary | ICD-10-CM | POA: Diagnosis not present

## 2014-11-30 DIAGNOSIS — R29898 Other symptoms and signs involving the musculoskeletal system: Secondary | ICD-10-CM

## 2014-11-30 DIAGNOSIS — R269 Unspecified abnormalities of gait and mobility: Secondary | ICD-10-CM

## 2014-11-30 DIAGNOSIS — M5386 Other specified dorsopathies, lumbar region: Secondary | ICD-10-CM

## 2014-11-30 NOTE — Therapy (Signed)
[facility] Outpatient Rehabilitation [facility] [address] [place], [place], [address] Phone: [phone]   Fax:  [phone]  Physical Therapy Treatment  Patient Details  Name: Amber Salas MRN: [id number] Date of Birth: Apr 13, 1976 Referring Provider:  Sheliah Hatch, MD  Encounter Date: 11/30/2014      PT End of Session - 11/30/14 0958    Visit Number 10   Number of Visits 16   Date for PT Re-Evaluation 12/18/14   PT Start Time 0855   PT Stop Time 0950   PT Time Calculation (min) 55 min   Activity Tolerance Patient tolerated treatment well   Behavior During Therapy [facility] for tasks assessed/performed      Past Medical History  Diagnosis Date  . Asthma     history of  . Retinal hole or tear     left eye- 1996 due to injury  . Obstructive sleep apnea on CPAP   . Extreme obesity   . OSA (obstructive sleep apnea) 08/30/2010  . GERD (gastroesophageal reflux disease)   . H/O hiatal hernia   . Headache(784.0)     migraines  . Hypertension   . Atypical chest pain     a. 02/2014 Cath: nl cors, EF 65-70%->d/c'd on nsaid and PPI.    Past Surgical History  Procedure Laterality Date  . Cesarean section  4/04  . Hernia repair  04/2009    abdominal hernia  . Wisdom tooth extraction  1999  . Eye surgery  11/2010    lasar repair of retinal tear  . Tubal ligation    . Esophagogastroduodenoscopy  12/30/2011    Procedure: ESOPHAGOGASTRODUODENOSCOPY (EGD);  Surgeon: [name] Dare, MD,FACG;  Location: [facility];  Service: Endoscopy;  Laterality: N/A;  . Laparoscopic total hysterectomy  03/17/13  . Abdominal hysterectomy    . Left heart catheterization with coronary angiogram N/A 03/07/2014    Procedure: LEFT HEART CATHETERIZATION WITH CORONARY ANGIOGRAM;  Surgeon: Micheline Chapman, MD;  Location: [facility] CATH LAB;  Service: Cardiovascular;  Laterality: N/A;    There were no vitals filed for this visit.  Visit Diagnosis:  Weakness of left leg  Left  low back pain, with sciatica presence unspecified  Decreased ROM of lumbar spine  Joint stiffness of spine  Abnormal posture  Abnormality of gait      Subjective Assessment - 11/30/14 0903    Subjective "I've been doing the exercises and stretch's and will sometimes over do it because i want it to get better"   Currently in Pain? Yes   Pain Score 6    Pain Location Back   Pain Orientation Right;Left   Pain Descriptors / Indicators Aching   Pain Type Chronic pain   Pain Onset More than a month ago   Pain Frequency Intermittent                         [facility] Adult PT Treatment/Exercise - 11/30/14 0905    Lumbar Exercises: Stretches   Lower Trunk Rotation 3 reps;30 seconds   Pelvic Tilt 3 reps;30 seconds   Lumbar Exercises: Supine   Other Supine Lumbar Exercises sciatic nerve glide glide/ stretch  with hip slightly flexed and knee ext doing ankle DF/PF.    Lumbar Exercises: Prone   Other Prone Lumbar Exercises multifitus, multifitus with knee flexion(active now) and multifitus with SLR Rt 5 reps with cues.     Other Prone Lumbar Exercises prone press-ups 2 x 10  with  manual overpressure   Knee/Hip Exercises: Stretches   Passive Hamstring Stretch Left;2 reps;30 seconds   Moist Heat Therapy   Number Minutes Moist Heat 15 Minutes   Moist Heat Location Lumbar Spine  in prone   Manual Therapy   Soft tissue mobilization DTM/ trigger point release of R lumbar/ thoracic paraspinals                PT Education - 11/30/14 0958    Education provided Yes   Education Details HEP review, and to perform prone extensions if standing is difficult   Person(s) Educated Patient   Methods Explanation   Comprehension Verbalized understanding          PT Short Term Goals - 11/30/14 1001    PT SHORT TERM GOAL #1   Title pt will be I with basic HEP 10/21/2014   Time 4   Period Weeks   Status Achieved   PT SHORT TERM GOAL #2   Title pt will decrease low back  pain to < 5/10 during and following standing for > 10-15 min to assist with exercise progression 10/21/2014   Time 4   Period Weeks   Status Achieved   PT SHORT TERM GOAL #3   Title pt will increase LLE strength to > 4/5 to assist with decreasing her fear of falling 10/21/2014   Time 4   Period Weeks   Status On-going   PT SHORT TERM GOAL #4   Title pt will increase FOTO score by >10 points to assist with functional progression 10/21/2014   Time 4   Period Weeks   Status On-going   PT SHORT TERM GOAL #5   Title pt will increase trunk mobility by > 10 degrees in all planes to assist with ADLs 10/21/2014   Time 4   Period Weeks   Status On-going           PT Long Term Goals - 11/30/14 1002    PT LONG TERM GOAL #1   Title pt will be I with advanced HEP 11/15/14   Time 8   Period Weeks   Status On-going   PT LONG TERM GOAL #2   Title pt will increase trunk mobility in all planes by > 20 degrees to assist with functional mobility 11/15/14   Time 8   Period Weeks   Status On-going   PT LONG TERM GOAL #3   Title pt will exhibit <3/10 pain during prolonged standing or walking for >30 min to assist with personal goal of playing with kids in water gun fight 11/15/14   Time 8   Period Weeks   Status On-going   PT LONG TERM GOAL #4   Title pt will be able to verbalize and demonstrate techniques to reduce risk or reinjury of the low back via postural awarenss, lifting and carrying mechanics, and HEp 11/15/14   Time 8   Period Weeks   Status Achieved   PT LONG TERM GOAL #5   Title pt will increase her FOTO score to > 50 to assist with her funcitonal capacity 11/15/14   Time 8   Period Weeks   Status On-going               Plan - 11/30/14 0959    Clinical Impression Statement pt arrived 10 minutes late today. She reports increased soreness in the back and radiating symptoms down the leg. She tolerated treatment well but reported tightness in the low back. She reported decreased  tightness and pain following STM and prone press ups.    PT Next Visit Plan FOTO, FOTO, FOTO. Try pallof press, wall push up for core.  hip strengthening.   PT Home Exercise Plan standing extension ( after any trunk flexion activity), sit to stands, sciatic nerve floss, bent knee marching with abdominal draw in manuever   Consulted and Agree with Plan of Care Patient        Problem List Patient Active Problem List   Diagnosis Date Noted  . Low back pain with left-sided sciatica 07/28/2014  . Exertional chest pain 03/07/2014  . Unstable angina 03/07/2014  . Essential hypertension 03/07/2014  . Abnormal ECG 03/07/2014  . Chest pain at rest 03/07/2014  . Chest pain 02/02/2014  . HTN (hypertension) 07/04/2013  . Heart murmur, systolic 03/08/2013  . Extreme obesity 03/07/2013  . Obstructive apnea 03/07/2013  . Encounter for routine gynecological examination 06/30/2012  . Atypical chest pain 04/05/2012  . GERD (gastroesophageal reflux disease) 12/30/2011  . Other dysphagia 12/30/2011  . 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 Chronic cough 10/31/2011  . General medical examination 05/27/2011  . OSA (obstructive sleep apnea) 08/30/2010  . HIP PAIN, LEFT 03/13/2010  . Morbid obesity 10/17/2009  . ALLERGIC RHINITIS 09/19/2009  . Asthma 09/19/2009   Lulu Riding PT, DPT, LAT, ATC  11/30/2014  10:04 AM   [facility] [address] [place], [place], [address] Phone: [phone]   Fax:  [phone]

## 2014-12-04 ENCOUNTER — Ambulatory Visit: Payer: 59 | Admitting: Physical Therapy

## 2014-12-04 DIAGNOSIS — M545 Low back pain: Secondary | ICD-10-CM | POA: Diagnosis not present

## 2014-12-04 DIAGNOSIS — R269 Unspecified abnormalities of gait and mobility: Secondary | ICD-10-CM

## 2014-12-04 DIAGNOSIS — R29898 Other symptoms and signs involving the musculoskeletal system: Secondary | ICD-10-CM

## 2014-12-04 DIAGNOSIS — M2560 Stiffness of unspecified joint, not elsewhere classified: Secondary | ICD-10-CM

## 2014-12-04 DIAGNOSIS — M5386 Other specified dorsopathies, lumbar region: Secondary | ICD-10-CM

## 2014-12-04 DIAGNOSIS — R293 Abnormal posture: Secondary | ICD-10-CM

## 2014-12-04 NOTE — Therapy (Signed)
[facility] Outpatient Rehabilitation [facility] [address] [place], [place], [address] Phone: [phone]   Fax:  [phone]  Physical Therapy Treatment  Patient Details  Name: Amber Salas MRN: [id number] Date of Birth: 07-23-1975 Referring Provider:  Sheliah Hatch, MD  Encounter Date: 12/04/2014      PT End of Session - 12/04/14 0934    Visit Number 11   Number of Visits 16   Date for PT Re-Evaluation 12/18/14   PT Start Time 0855   PT Stop Time 0945   PT Time Calculation (min) 50 min   Activity Tolerance Patient tolerated treatment well   Behavior During Therapy [facility] for tasks assessed/performed      Past Medical History  Diagnosis Date  . Asthma     history of  . Retinal hole or tear     left eye- 1996 due to injury  . Obstructive sleep apnea on CPAP   . Extreme obesity   . OSA (obstructive sleep apnea) 08/30/2010  . GERD (gastroesophageal reflux disease)   . H/O hiatal hernia   . Headache(784.0)     migraines  . Hypertension   . Atypical chest pain     a. 02/2014 Cath: nl cors, EF 65-70%->d/c'd on nsaid and PPI.    Past Surgical History  Procedure Laterality Date  . Cesarean section  4/04  . Hernia repair  04/2009    abdominal hernia  . Wisdom tooth extraction  1999  . Eye surgery  11/2010    lasar repair of retinal tear  . Tubal ligation    . Esophagogastroduodenoscopy  12/30/2011    Procedure: ESOPHAGOGASTRODUODENOSCOPY (EGD);  Surgeon: [name] Dare, MD,FACG;  Location: [facility];  Service: Endoscopy;  Laterality: N/A;  . Laparoscopic total hysterectomy  03/17/13  . Abdominal hysterectomy    . Left heart catheterization with coronary angiogram N/A 03/07/2014    Procedure: LEFT HEART CATHETERIZATION WITH CORONARY ANGIOGRAM;  Surgeon: Micheline Chapman, MD;  Location: [facility] CATH LAB;  Service: Cardiovascular;  Laterality: N/A;    There were no vitals filed for this visit.  Visit Diagnosis:  Weakness of left leg  Left  low back pain, with sciatica presence unspecified  Decreased ROM of lumbar spine  Joint stiffness of spine  Abnormal posture  Abnormality of gait      Subjective Assessment - 12/04/14 0902    Subjective "I went to the pool on Saturday and did walking and today I am extemely sore today"    Currently in Pain? Yes   Pain Score 8    Pain Location Back   Pain Orientation Right;Left   Pain Descriptors / Indicators Aching;Tingling   Pain Type Chronic pain   Pain Onset More than a month ago   Pain Frequency Intermittent                         [facility] Adult PT Treatment/Exercise - 12/04/14 0905    Self-Care   Self-Care ADL's   ADL's how to perform daily tasks while maintaining good posture. and performrming exercises at home and proper lifting mechanics.    Lumbar Exercises: Stretches   Lower Trunk Rotation 3 reps;30 seconds   Pelvic Tilt 3 reps;30 seconds   Piriformis Stretch 2 reps;30 seconds   Lumbar Exercises: Aerobic   Stationary Bike Nu Step 5 x 10   Lumbar Exercises: Standing   Other Standing Lumbar Exercises repeated standing extensions   Lumbar Exercises: Supine  Bent Knee Raise 20 reps;1 second   Other Supine Lumbar Exercises sciatic nerve glide glide/ stretch   Lumbar Exercises: Prone   Other Prone Lumbar Exercises multifitus, multifitus with knee flexion(active now) and multifitus with SLR Rt 5 reps with cues.     Knee/Hip Exercises: Stretches   Passive Hamstring Stretch Left;2 reps;30 seconds   Soleus Stretch 2 reps;30 seconds   Other Knee/Hip Stretches standing repeated extension   Moist Heat Therapy   Number Minutes Moist Heat 10 Minutes   Moist Heat Location Lumbar Spine                PT Education - 12/04/14 0933    Education provided Yes   Education Details lifting mechanics, and education regarding exercise and keep moving to avoid the muscles from getting stiff   Person(s) Educated Patient   Methods Explanation    Comprehension Verbalized understanding          PT Short Term Goals - 11/30/14 1001    PT SHORT TERM GOAL #1   Title pt will be I with basic HEP 10/21/2014   Time 4   Period Weeks   Status Achieved   PT SHORT TERM GOAL #2   Title pt will decrease low back pain to < 5/10 during and following standing for > 10-15 min to assist with exercise progression 10/21/2014   Time 4   Period Weeks   Status Achieved   PT SHORT TERM GOAL #3   Title pt will increase LLE strength to > 4/5 to assist with decreasing her fear of falling 10/21/2014   Time 4   Period Weeks   Status On-going   PT SHORT TERM GOAL #4   Title pt will increase FOTO score by >10 points to assist with functional progression 10/21/2014   Time 4   Period Weeks   Status On-going   PT SHORT TERM GOAL #5   Title pt will increase trunk mobility by > 10 degrees in all planes to assist with ADLs 10/21/2014   Time 4   Period Weeks   Status On-going           PT Long Term Goals - 11/30/14 1002    PT LONG TERM GOAL #1   Title pt will be I with advanced HEP 11/15/14   Time 8   Period Weeks   Status On-going   PT LONG TERM GOAL #2   Title pt will increase trunk mobility in all planes by > 20 degrees to assist with functional mobility 11/15/14   Time 8   Period Weeks   Status On-going   PT LONG TERM GOAL #3   Title pt will exhibit <3/10 pain during prolonged standing or walking for >30 min to assist with personal goal of playing with kids in water gun fight 11/15/14   Time 8   Period Weeks   Status On-going   PT LONG TERM GOAL #4   Title pt will be able to verbalize and demonstrate techniques to reduce risk or reinjury of the low back via postural awarenss, lifting and carrying mechanics, and HEp 11/15/14   Time 8   Period Weeks   Status Achieved   PT LONG TERM GOAL #5   Title pt will increase her FOTO score to > 50 to assist with her funcitonal capacity 11/15/14   Time 8   Period Weeks   Status On-going  Plan - 12/04/14 1016    Clinical Impression Statement pt arrived to todays visit 10 minutes late. she reports that she has been doing more waling, and activities as well as walking/ standing in the pool. today she reports being increasingly sore and is using SPC for stability today. After the Nustep she reported feeling much looser and tolerated all exercises well today and reported decreaed pain to a 4/10.    PT Next Visit Plan FOTO, FOTO, FOTO. Try pallof press, wall push up for core.  hip strengthening.   PT Home Exercise Plan standing extension ( after any trunk flexion activity), sit to stands, sciatic nerve floss, bent knee marching with abdominal draw in manuever   Consulted and Agree with Plan of Care Patient        Problem List Patient Active Problem List   Diagnosis Date Noted  . Low back pain with left-sided sciatica 07/28/2014  . Exertional chest pain 03/07/2014  . Unstable angina 03/07/2014  . Essential hypertension 03/07/2014  . Abnormal ECG 03/07/2014  . Chest pain at rest 03/07/2014  . Chest pain 02/02/2014  . HTN (hypertension) 07/04/2013  . Heart murmur, systolic 03/08/2013  . Extreme obesity 03/07/2013  . Obstructive apnea 03/07/2013  . Encounter for routine gynecological examination 06/30/2012  . Atypical chest pain 04/05/2012  . GERD (gastroesophageal reflux disease) 12/30/2011  . Other dysphagia 12/30/2011  . 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 Chronic cough 10/31/2011  . General medical examination 05/27/2011  . OSA (obstructive sleep apnea) 08/30/2010  . HIP PAIN, LEFT 03/13/2010  . Morbid obesity 10/17/2009  . ALLERGIC RHINITIS 09/19/2009  . Asthma 09/19/2009   Lulu Riding PT, DPT, LAT, ATC  12/04/2014  10:20 AM   [facility] [address] [place], [place], [address] Phone: [phone]   Fax:  [phone]

## 2014-12-07 ENCOUNTER — Ambulatory Visit: Payer: 59 | Admitting: Physical Therapy

## 2014-12-07 DIAGNOSIS — R293 Abnormal posture: Secondary | ICD-10-CM

## 2014-12-07 DIAGNOSIS — M2560 Stiffness of unspecified joint, not elsewhere classified: Secondary | ICD-10-CM

## 2014-12-07 DIAGNOSIS — M545 Low back pain: Secondary | ICD-10-CM | POA: Diagnosis not present

## 2014-12-07 DIAGNOSIS — R29898 Other symptoms and signs involving the musculoskeletal system: Secondary | ICD-10-CM

## 2014-12-07 DIAGNOSIS — M5386 Other specified dorsopathies, lumbar region: Secondary | ICD-10-CM

## 2014-12-07 NOTE — Therapy (Signed)
[facility] Outpatient Rehabilitation [facility] [address] [place], [place], [address] Phone: [phone]   Fax:  [phone]  Physical Therapy Treatment  Patient Details  Name: Amber Salas MRN: [id number] Date of Birth: 11-07-1975 Referring Provider:  Sheliah Hatch, MD  Encounter Date: 12/07/2014      PT End of Session - 12/07/14 1041    Visit Number 12   Number of Visits 16   Date for PT Re-Evaluation 12/18/14   PT Start Time 0901   PT Stop Time 0930   PT Time Calculation (min) 29 min   Activity Tolerance Patient tolerated treatment well   Behavior During Therapy [facility] for tasks assessed/performed      Past Medical History  Diagnosis Date  . Asthma     history of  . Retinal hole or tear     left eye- 1996 due to injury  . Obstructive sleep apnea on CPAP   . Extreme obesity   . OSA (obstructive sleep apnea) 08/30/2010  . GERD (gastroesophageal reflux disease)   . H/O hiatal hernia   . Headache(784.0)     migraines  . Hypertension   . Atypical chest pain     a. 02/2014 Cath: nl cors, EF 65-70%->d/c'd on nsaid and PPI.    Past Surgical History  Procedure Laterality Date  . Cesarean section  4/04  . Hernia repair  04/2009    abdominal hernia  . Wisdom tooth extraction  1999  . Eye surgery  11/2010    lasar repair of retinal tear  . Tubal ligation    . Esophagogastroduodenoscopy  12/30/2011    Procedure: ESOPHAGOGASTRODUODENOSCOPY (EGD);  Surgeon: [name] Dare, MD,FACG;  Location: [facility];  Service: Endoscopy;  Laterality: N/A;  . Laparoscopic total hysterectomy  03/17/13  . Abdominal hysterectomy    . Left heart catheterization with coronary angiogram N/A 03/07/2014    Procedure: LEFT HEART CATHETERIZATION WITH CORONARY ANGIOGRAM;  Surgeon: Micheline Chapman, MD;  Location: [facility] CATH LAB;  Service: Cardiovascular;  Laterality: N/A;    There were no vitals filed for this visit.  Visit Diagnosis:  Weakness of left  leg  Decreased ROM of lumbar spine  Joint stiffness of spine  Abnormal posture      Subjective Assessment - 12/07/14 0911    Subjective I overslept.  Walks 15 minutes at home  and another 10 minutes at work. Taking fewer meds.   Currently in Pain? No/denies   Pain Score 4   had some this am   Pain Location Back   Pain Orientation Right;Left   Pain Descriptors / Indicators Aching;Tingling   Pain Radiating Towards leg LT   Aggravating Factors  walking, wakes up with    Pain Relieving Factors moving around, heating pad                          [facility] Adult PT Treatment/Exercise - 12/07/14 0905    Lumbar Exercises: Aerobic   Stationary Bike L 5 12.5 minutes, 444 steps 22 calories.   Lumbar Exercises: Supine   Ab Set 10 reps  cues for pelvic floor involvment   Bent Knee Raise 20 reps   Bridge 10 reps  cues                  PT Short Term Goals - 12/07/14 1044    PT SHORT TERM GOAL #1   Time 4   Period Weeks   Status Achieved  PT SHORT TERM GOAL #2   Title pt will decrease low back pain to < 5/10 during and following standing for > 10-15 min to assist with exercise progression 10/21/2014   Time 4   Period Weeks   Status Achieved   PT SHORT TERM GOAL #3   Title pt will increase LLE strength to > 4/5 to assist with decreasing her fear of falling 10/21/2014   Time 4   Period Weeks   Status On-going   PT SHORT TERM GOAL #4   Title pt will increase FOTO score by >10 points to assist with functional progression 10/21/2014   Time 4   Status Unable to assess   PT SHORT TERM GOAL #5   Title pt will increase trunk mobility by > 10 degrees in all planes to assist with ADLs 10/21/2014   Time 4   Period Weeks   Status Unable to assess           PT Long Term Goals - 11/30/14 1002    PT LONG TERM GOAL #1   Title pt will be I with advanced HEP 11/15/14   Time 8   Period Weeks   Status On-going   PT LONG TERM GOAL #2   Title pt will increase trunk  mobility in all planes by > 20 degrees to assist with functional mobility 11/15/14   Time 8   Period Weeks   Status On-going   PT LONG TERM GOAL #3   Title pt will exhibit <3/10 pain during prolonged standing or walking for >30 min to assist with personal goal of playing with kids in water gun fight 11/15/14   Time 8   Period Weeks   Status On-going   PT LONG TERM GOAL #4   Title pt will be able to verbalize and demonstrate techniques to reduce risk or reinjury of the low back via postural awarenss, lifting and carrying mechanics, and HEp 11/15/14   Time 8   Period Weeks   Status Achieved   PT LONG TERM GOAL #5   Title pt will increase her FOTO score to > 50 to assist with her funcitonal capacity 11/15/14   Time 8   Period Weeks   Status On-going               Plan - 12/07/14 1042    Clinical Impression Statement Patient would like the names of Back MD'd that specialize in sciatica.  She wants to change from a Sports MD in [place].  Shorter sessiont today patient overslept.   Endurance improving in clinic, 12.5 on Nustep, able to walk 25 minutes total yesyterday.        Problem List Patient Active Problem List   Diagnosis Date Noted  . Low back pain with left-sided sciatica 07/28/2014  . Exertional chest pain 03/07/2014  . Unstable angina 03/07/2014  . Essential hypertension 03/07/2014  . Abnormal ECG 03/07/2014  . Chest pain at rest 03/07/2014  . Chest pain 02/02/2014  . HTN (hypertension) 07/04/2013  . Heart murmur, systolic 03/08/2013  . Extreme obesity 03/07/2013  . Obstructive apnea 03/07/2013  . Encounter for routine gynecological examination 06/30/2012  . Atypical chest pain 04/05/2012  . GERD (gastroesophageal reflux disease) 12/30/2011  . Other dysphagia 12/30/2011  . 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 Chronic cough 10/31/2011  . General medical examination 05/27/2011  . OSA (obstructive sleep apnea) 08/30/2010  . HIP PAIN, LEFT 03/13/2010  . Morbid obesity  10/17/2009  . ALLERGIC RHINITIS 09/19/2009  .  Asthma 09/19/2009    Shereece Wellborn 12/07/2014, 10:46 AM  [facility] [address] [place], [place], [address] Phone: [phone]   Fax:  [phone]     Liz Beach, PTA 12/07/2014 10:46 AM Phone: [phone] Fax: [phone]

## 2014-12-11 ENCOUNTER — Ambulatory Visit: Payer: 59 | Attending: Family Medicine | Admitting: Physical Therapy

## 2014-12-11 DIAGNOSIS — R29898 Other symptoms and signs involving the musculoskeletal system: Secondary | ICD-10-CM

## 2014-12-11 DIAGNOSIS — M2560 Stiffness of unspecified joint, not elsewhere classified: Secondary | ICD-10-CM | POA: Diagnosis present

## 2014-12-11 DIAGNOSIS — M545 Low back pain: Secondary | ICD-10-CM | POA: Insufficient documentation

## 2014-12-11 DIAGNOSIS — R269 Unspecified abnormalities of gait and mobility: Secondary | ICD-10-CM

## 2014-12-11 DIAGNOSIS — M5386 Other specified dorsopathies, lumbar region: Secondary | ICD-10-CM

## 2014-12-11 DIAGNOSIS — R293 Abnormal posture: Secondary | ICD-10-CM | POA: Diagnosis present

## 2014-12-11 NOTE — Therapy (Signed)
[facility] Outpatient Rehabilitation [facility] [address] [place], [place], [address] Phone: [phone]   Fax:  [phone]  Physical Therapy Treatment  Patient Details  Name: Amber Salas MRN: [id number] Date of Birth: 08-01-1975 Referring Provider:  Sheliah Hatch, MD  Encounter Date: 12/11/2014      PT End of Session - 12/11/14 0907    Visit Number 13   Number of Visits 16   Date for PT Re-Evaluation 12/18/14   PT Start Time 0825   PT Stop Time 0900   PT Time Calculation (min) 35 min   Activity Tolerance Patient tolerated treatment well;No increased pain   Behavior During Therapy [facility] for tasks assessed/performed      Past Medical History  Diagnosis Date  . Asthma     history of  . Retinal hole or tear     left eye- 1996 due to injury  . Obstructive sleep apnea on CPAP   . Extreme obesity   . OSA (obstructive sleep apnea) 08/30/2010  . GERD (gastroesophageal reflux disease)   . H/O hiatal hernia   . Headache(784.0)     migraines  . Hypertension   . Atypical chest pain     a. 02/2014 Cath: nl cors, EF 65-70%->d/c'd on nsaid and PPI.    Past Surgical History  Procedure Laterality Date  . Cesarean section  4/04  . Hernia repair  04/2009    abdominal hernia  . Wisdom tooth extraction  1999  . Eye surgery  11/2010    lasar repair of retinal tear  . Tubal ligation    . Esophagogastroduodenoscopy  12/30/2011    Procedure: ESOPHAGOGASTRODUODENOSCOPY (EGD);  Surgeon: [name] Dare, MD,FACG;  Location: [facility];  Service: Endoscopy;  Laterality: N/A;  . Laparoscopic total hysterectomy  03/17/13  . Abdominal hysterectomy    . Left heart catheterization with coronary angiogram N/A 03/07/2014    Procedure: LEFT HEART CATHETERIZATION WITH CORONARY ANGIOGRAM;  Surgeon: Micheline Chapman, MD;  Location: [facility] CATH LAB;  Service: Cardiovascular;  Laterality: N/A;    There were no vitals filed for this visit.  Visit Diagnosis:  Weakness of  left leg  Decreased ROM of lumbar spine  Joint stiffness of spine  Abnormal posture  Left low back pain, with sciatica presence unspecified  Abnormality of gait      Subjective Assessment - 12/11/14 0857    Subjective Pt was late to her appointment. She states she is in pain today. Went to the pool twice last week. On saturday stayed longer than an hour and thinks she over did it although she did take breaks bysitting on the side of the pool.   Currently in Pain? Yes   Pain Score 6    Pain Location Back   Pain Orientation Left;Lower   Pain Descriptors / Indicators Constant;Shooting;Sharp   Pain Radiating Towards down the leg stopping at the knee, coming around anteriorly   Pain Relieving Factors nothing this morning would relieve her pain            [facility] Adult PT Treatment/Exercise - 12/11/14 0825    Lumbar Exercises: Stretches   Active Hamstring Stretch 3 reps;30 seconds  L LE   Passive Hamstring Stretch 4 reps;30 seconds  Both LE   Lower Trunk Rotation 10 seconds  8 total reps with ball under knees   Press Ups --   10 reps,2 sec hld at top 4 sets, 2/4 w/ manual overpressure   Moist Heat Therapy  Number Minutes Moist Heat 10 Minutes   Moist Heat Location Lumbar Spine  prone            PT Education - 12/11/14 0901    Education provided Yes   Education Details HEP,  standing extension exercises on work breaks and to stick to  2 days/ week of aquatics until this can be done without increasing her pain.   Person(s) Educated Patient   Methods Explanation;Demonstration   Comprehension Verbalized understanding;Returned demonstration          PT Short Term Goals - 12/11/14 0915    PT SHORT TERM GOAL #1   Title pt will be I with basic HEP 10/21/2014   Time 4   Period Weeks   Status Achieved   PT SHORT TERM GOAL #2   Title pt will decrease low back pain to < 5/10 during and following standing for > 10-15 min to assist with exercise progression  10/21/2014   Time 4   Period Weeks   Status Achieved   PT SHORT TERM GOAL #3   Title pt will increase LLE strength to > 4/5 to assist with decreasing her fear of falling 10/21/2014   Time 4   Period Weeks   Status On-going   PT SHORT TERM GOAL #4   Title pt will increase FOTO score by >10 points to assist with functional progression 10/21/2014   Time 4   Period Weeks   Status Unable to assess           PT Long Term Goals - 12/11/14 0915    PT LONG TERM GOAL #1   Title pt will be I with advanced HEP 11/15/14   Time 8   Period Weeks   Status On-going   PT LONG TERM GOAL #2   Title Pt will increase trunk mobility in all planes by > 20 degrees to assist with functional mobility 11/15/14   Time 8   Period Weeks   Status On-going   PT LONG TERM GOAL #3   Title pt will exhibit <3/10 pain during prolonged standing or walking for >30 min to assist with personal goal of playing with kids in water gun fight 11/15/14   Time 8   Period Weeks   Status On-going   PT LONG TERM GOAL #4   Title pt will be able to verbalize and demonstrate techniques to reduce risk or reinjury of the low back via postural awarenss, lifting and carrying mechanics, and HEp 11/15/14   Time 8   Period Weeks   Status Achieved   PT LONG TERM GOAL #5   Title pt will increase her FOTO score to > 50 to assist with her funcitonal capacity 11/15/14   Status Unable to assess               Plan - 12/11/14 0909    Clinical Impression Statement Pt was late today, therefore her session was a little shorter. She is going to the pool and enjoying it. During the week she is by herself so she completes about of walking and exercises and  on Saturdays she is with her children and friends so she is in the pool for longer than an hour.  Pt. believes she over did it this weekend and was in a lot of pain upon her arrival in the clinic, however she stated her pain was a 2/10 following treatment and felt like "i can make  it".    PT Next  Visit Plan prone pressups with over pressure to L side low back, peforming standing back extensions at counter, quadraped exercises for core   PT Home Exercise Plan standing extension ( after any trunk flexion activity), sit to stands, sciatic nerve floss, bent knee marching with abdominal draw in manuever, supine hamstring stretch with sheet, prone back extension (cobra's)   Consulted and Agree with Plan of Care Patient        Problem List Patient Active Problem List   Diagnosis Date Noted  . Low back pain with left-sided sciatica 07/28/2014  . Exertional chest pain 03/07/2014  . Unstable angina 03/07/2014  . Essential hypertension 03/07/2014  . Abnormal ECG 03/07/2014  . Chest pain at rest 03/07/2014  . Chest pain 02/02/2014  . HTN (hypertension) 07/04/2013  . Heart murmur, systolic 03/08/2013  . Extreme obesity 03/07/2013  . Obstructive apnea 03/07/2013  . Encounter for routine gynecological examination 06/30/2012  . Atypical chest pain 04/05/2012  . GERD (gastroesophageal reflux disease) 12/30/2011  . Other dysphagia 12/30/2011  . 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 Chronic cough 10/31/2011  . General medical examination 05/27/2011  . OSA (obstructive sleep apnea) 08/30/2010  . HIP PAIN, LEFT 03/13/2010  . Morbid obesity 10/17/2009  . ALLERGIC RHINITIS 09/19/2009  . Asthma 09/19/2009    PAA,JENNIFER 12/11/2014, 3:28 PM  [facility] Outpatient Rehabilitation [facility] [address] [place], [place], [address] Phone: [phone]   Fax:  [phone]   Karie Mainland, PT 12/11/2014 3:30 PM Phone: [phone] Fax: [phone]

## 2014-12-11 NOTE — Patient Instructions (Signed)
Hamstring Step 1   Straighten left knee. Keep knee level with other knee or on bolster. Hold __30_ seconds. Relax knee by returning foot to start. Repeat 3__ times/leg. Can use sheet at home.Trunk: Prone Extension (Press-Ups)   Lie on stomach on firm, flat surface. Relax bottom and legs. Raise chest in air with elbows straight. Keep hips flat on surface, sag stomach. Hold __3__ seconds. Repeat 10 times. Do 2-3 sessions per day. CAUTION: Movement should be gentle and slow.  Copyright  VHI. All rights reserved.    Copyright  VHI. All rights reserved.

## 2014-12-14 ENCOUNTER — Ambulatory Visit: Payer: 59 | Admitting: Physical Therapy

## 2014-12-14 DIAGNOSIS — R269 Unspecified abnormalities of gait and mobility: Secondary | ICD-10-CM

## 2014-12-14 DIAGNOSIS — R293 Abnormal posture: Secondary | ICD-10-CM

## 2014-12-14 DIAGNOSIS — R29898 Other symptoms and signs involving the musculoskeletal system: Secondary | ICD-10-CM | POA: Diagnosis not present

## 2014-12-14 DIAGNOSIS — M545 Low back pain: Secondary | ICD-10-CM

## 2014-12-14 DIAGNOSIS — M5386 Other specified dorsopathies, lumbar region: Secondary | ICD-10-CM

## 2014-12-14 DIAGNOSIS — M2560 Stiffness of unspecified joint, not elsewhere classified: Secondary | ICD-10-CM

## 2014-12-14 NOTE — Therapy (Signed)
[facility] [address] [place], [place], [address] Phone: [phone]   Fax:  [phone]  Physical Therapy Treatment / Re-certification  Patient Details  Name: Amber Salas MRN: [id number] Date of Birth: 1975/07/31 Referring Provider:  Sheliah Hatch, MD  Encounter Date: 12/14/2014      PT End of Session - 12/14/14 1008    Visit Number 14   Number of Visits 18   Date for PT Re-Evaluation 01/11/15   PT Start Time 0805   PT Stop Time 0855   PT Time Calculation (min) 50 min   Activity Tolerance Patient tolerated treatment well   Behavior During Therapy [facility] for tasks assessed/performed      Past Medical History  Diagnosis Date  . Asthma     history of  . Retinal hole or tear     left eye- 1996 due to injury  . Obstructive sleep apnea on CPAP   . Extreme obesity   . OSA (obstructive sleep apnea) 08/30/2010  . GERD (gastroesophageal reflux disease)   . H/O hiatal hernia   . Headache(784.0)     migraines  . Hypertension   . Atypical chest pain     a. 02/2014 Cath: nl cors, EF 65-70%->d/c'd on nsaid and PPI.    Past Surgical History  Procedure Laterality Date  . Cesarean section  4/04  . Hernia repair  04/2009    abdominal hernia  . Wisdom tooth extraction  1999  . Eye surgery  11/2010    lasar repair of retinal tear  . Tubal ligation    . Esophagogastroduodenoscopy  12/30/2011    Procedure: ESOPHAGOGASTRODUODENOSCOPY (EGD);  Surgeon: [name] Dare, MD,FACG;  Location: [facility];  Service: Endoscopy;  Laterality: N/A;  . Laparoscopic total hysterectomy  03/17/13  . Abdominal hysterectomy    . Left heart catheterization with coronary angiogram N/A 03/07/2014    Procedure: LEFT HEART CATHETERIZATION WITH CORONARY ANGIOGRAM;  Surgeon: Micheline Chapman, MD;  Location: [facility] C[facility] LAB;  Service: Cardiovascular;  Laterality: N/A;    There were no vitals filed for this visit.  Visit Diagnosis:  Weakness of  left leg - Plan: PT plan of care cert/re-cert  Decreased ROM of lumbar spine - Plan: PT plan of care cert/re-cert  Joint stiffness of spine - Plan: PT plan of care cert/re-cert  Abnormal posture - Plan: PT plan of care cert/re-cert  Left low back pain, with sciatica presence unspecified - Plan: PT plan of care cert/re-cert  Abnormality of gait - Plan: PT plan of care cert/re-cert      Subjective Assessment - 12/14/14 0814    Subjective "I am feeling pretty good since the last visit, and Im not having as much pain, and have been sleeping pretty"   Currently in Pain? Yes   Pain Score 4    Pain Location Back   Pain Orientation Left;Lower   Pain Descriptors / Indicators Constant;Shooting;Sharp   Pain Type Chronic pain   Pain Onset More than a month ago   Pain Frequency Intermittent   Aggravating Factors  waking up in the morning,    Pain Relieving Factors nothing            [facility] PT Assessment - 12/14/14 1004    Assessment   Medical Diagnosis L low back pain with LLE sciatic   Onset Date/Surgical Date --  7 years ago   Prior Therapy no   Precautions   Precautions None   Restrictions  Weight Bearing Restrictions No   Balance Screen   Has the patient fallen in the past 6 months Yes   How many times? 1   Has the patient had a decrease in activity level because of a fear of falling?  Yes   Is the patient reluctant to leave their home because of a fear of falling?  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   Leisure hangout with family   Cognition   Overall Cognitive Status Within Functional Limits for tasks assessed   AROM   Lumbar Flexion 70   Lumbar Extension 22   Lumbar - Right Side Bend 38   Lumbar - Left Side Bend 30   Strength   Right Hip Flexion 4-/5   Right Hip Extension 4+/5   Right Hip ABduction 4+/5   Right Hip ADduction 4/5   Left Hip Flexion 4/5   Left Hip Extension 3+/5   Left Hip ABduction 4+/5   Left Hip ADduction 4/5   Right Knee Flexion 4+/5   Right Knee Extension 5/5   Left Knee Flexion 4/5   Left Knee Extension 4/5                     [facility] Adult PT Treatment/Exercise - 12/14/14 0818    Self-Care   Self-Care Other Self-Care Comments   Other Self-Care Comments  Education regarding hip mechanics and SI mobility. How stretching hamstrings and activating her hip flexors to pull the innominate forward to help decrease asymmetry of anterior / posterior thigh musculature   Lumbar Exercises: Stretches   Active Hamstring Stretch 2 reps;30 seconds   Lower Trunk Rotation 5 reps;10 seconds   Piriformis Stretch 2 reps;30 seconds   Lumbar Exercises: Aerobic   Stationary Bike L 6 x 11 minutes  442 steps   Lumbar Exercises: Standing   Other Standing Lumbar Exercises repeated standing extensions 2 x 15   Lumbar Exercises: Supine   Ab Set 10 reps   Bent Knee Raise 10 reps  x 3 sets,VC for for abdominal draw in manuever   Moist Heat Therapy   Number Minutes Moist Heat 10 Minutes   Moist Heat Location Lumbar Spine  in sitting                PT Education - 12/14/14 1006    Education provided Yes   Education Details posture education, added SLR to HEP   Person(s) Educated Patient   Methods Explanation   Comprehension Verbalized understanding          PT Short Term Goals - 12/14/14 1012    PT SHORT TERM GOAL #1   Title pt will be I  with basic HEP 10/21/2014   Time 4   Period Weeks   Status Achieved   PT SHORT TERM GOAL #2   Title pt will decrease low back pain to < 5/10 during and following standing for > 10-15 min  to assist with exercise progression 10/21/2014   Time 4   Period Weeks   Status Achieved   PT SHORT TERM GOAL #3   Title pt will increase LLE strength to > 4/5 to assist with decreasing her fear of falling 10/21/2014   Time 4   Period Weeks   Status On-going   PT SHORT TERM GOAL #4   Title pt will increase FOTO score by >10 points to assist with functional progression 10/21/2014   Time 4   Period Weeks   Status Unable to assess   PT SHORT TERM GOAL #5   Title pt will increase trunk mobility by > 10 degrees in all planes to assist with ADLs 10/21/2014   Time 4   Period Weeks   Status Partially Met           PT Long Term Goals - 12/14/14 1013    PT LONG TERM GOAL #1   Title pt will be I with advanced HEP 11/15/14   Time 8   Period Weeks   Status On-going   PT LONG TERM GOAL #2   Title Pt will increase trunk mobility in all planes by > 20 degrees to assist with functional mobility 11/15/14   Time 8   Period Weeks   Status On-going   PT LONG TERM GOAL #3   Title pt will exhibit <3/10 pain during prolonged standing or walking for >30 min to assist with personal goal of playing with kids in water gun fight 11/15/14   Time 8   Period Weeks   Status On-going   PT LONG TERM GOAL #4   Title pt will be able to verbalize and demonstrate techniques to reduce risk or reinjury of the low back via postural awarenss, lifting and carrying mechanics, and HEp 11/15/14   Period Weeks   Status Achieved   PT LONG TERM GOAL #5   Title pt will increase her FOTO score to > 50 to assist with her funcitonal capacity 11/15/14   Time 8   Period Weeks   Status Unable to assess               Plan - 12/14/14 1008    Clinical Impression Statement Elgie continues to make progress with with trunk mobility. She has  been more active with doing more walking and pool workouts which causes more soreness but tends to be more muscular soreness with Delayed onset muscle sorenss. She demonstrates significant hamstring restriction with pulling in the low back, and weakness of the L hip flexors she may demonstrate posterior rotation of the L innominate with subjective pain noted at the PSIS joint. Testing is diffucult to decipher due to the size of the pt.  Plan to continue with POC and  progress pt to 1 x a week for 4 weeks to work on transitioning to home independent home exercise program   Pt will benefit from skilled therapeutic intervention in order to improve on the following deficits Abnormal gait;Obesity;Pain;Improper body mechanics;Postural dysfunction;Impaired flexibility;Difficulty walking;Decreased range of motion;Decreased activity tolerance;Decreased mobility;Decreased strength;Increased muscle spasms;Decreased balance;Decreased endurance;Decreased safety awareness;Increased fascial restricitons   Rehab Potential Good   PT Frequency 1x / week   PT Duration 4 weeks   PT Treatment/Interventions ADLs/Self Care Home Management;Moist Heat;Therapeutic activities;Patient/family education;Therapeutic exercise;Passive range of motion;Ultrasound;Gait training;Balance training;Manual techniques;Dry needling;Cryotherapy;Neuromuscular re-education;Electrical Stimulation   PT Next Visit Plan prone pressups with over pressure to L side low back, peforming standing back extensions at counter, quadraped exercises for core, L  hamstring stretching, SLR, L innominate mobility?  FOTO   PT Home Exercise Plan standing extension ( after any trunk flexion activity), sit to stands, sciatic nerve floss, bent knee marching with abdominal draw in manuever, supine hamstring stretch with sheet, prone back extension (cobra's)   Consulted and Agree with Plan of Care Patient        Problem List Patient Active Problem List   Diagnosis Date  Noted  . Low back pain with left-sided sciatica 07/28/2014  . Exertional chest pain 03/07/2014  . Unstable angina 03/07/2014  . Essential hypertension 03/07/2014  . Abnormal ECG 03/07/2014  . Chest pain at rest 03/07/2014  . Chest pain 02/02/2014  . HTN (hypertension) 07/04/2013  . Heart murmur, systolic 59/27/6394  . Extreme obesity 03/07/2013  . Obstructive apnea 03/07/2013  . Encounter for routine gynecological examination 06/30/2012  . Atypical chest pain 04/05/2012  . GERD (gastroesophageal reflux disease) 12/30/2011  . Other dysphagia 12/30/2011  . 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 Chronic cough 10/31/2011  . General medical examination 05/27/2011  . OSA (obstructive sleep apnea) 08/30/2010  . HIP PAIN, LEFT 03/13/2010  . Morbid obesity 10/17/2009  . ALLERGIC RHINITIS 09/19/2009  . Asthma 09/19/2009   Starr Lake PT, DPT, LAT, ATC  12/14/2014  10:18 AM    [facility] [address] [place], [place], [address] Phone: [phone]   Fax:  [phone]

## 2014-12-14 NOTE — Patient Instructions (Signed)
   Kristoffer Leamon PT, DPT, LAT, ATC  [facility] Phone: [phone]     

## 2014-12-19 ENCOUNTER — Encounter: Payer: 59 | Admitting: Physical Therapy

## 2014-12-19 ENCOUNTER — Encounter: Payer: Self-pay | Admitting: Family Medicine

## 2014-12-20 ENCOUNTER — Ambulatory Visit: Payer: 59 | Admitting: Family Medicine

## 2014-12-21 ENCOUNTER — Ambulatory Visit: Payer: 59 | Admitting: Physical Therapy

## 2014-12-21 DIAGNOSIS — M2560 Stiffness of unspecified joint, not elsewhere classified: Secondary | ICD-10-CM

## 2014-12-21 DIAGNOSIS — M5386 Other specified dorsopathies, lumbar region: Secondary | ICD-10-CM

## 2014-12-21 DIAGNOSIS — R29898 Other symptoms and signs involving the musculoskeletal system: Secondary | ICD-10-CM | POA: Diagnosis not present

## 2014-12-21 DIAGNOSIS — R293 Abnormal posture: Secondary | ICD-10-CM

## 2014-12-21 DIAGNOSIS — R269 Unspecified abnormalities of gait and mobility: Secondary | ICD-10-CM

## 2014-12-21 DIAGNOSIS — M545 Low back pain: Secondary | ICD-10-CM

## 2014-12-21 NOTE — Therapy (Addendum)
[place], [place], [address] Phone: [phone]   Fax:  [phone]  Physical Therapy Treatment  Patient Details  Name: FILOMENA POKORNEY MRN: [id number] Date of Birth: Mar 28, 1976 Referring Provider:  Midge Minium, MD  Encounter Date: 12/21/2014      PT End of Session - 12/21/14 0936    Visit Number 15   Number of Visits 18   Date for PT Re-Evaluation 01/11/15   PT Start Time 0900   PT Stop Time 0945   PT Time Calculation (min) 45 min   Activity Tolerance Patient tolerated treatment well   Behavior During Therapy [facility] for tasks assessed/performed      Past Medical History  Diagnosis Date  . Asthma     history of  . Retinal hole or tear     left eye- 1996 due to injury  . Obstructive sleep apnea on CPAP   . Extreme obesity   . OSA (obstructive sleep apnea) 08/30/2010  . GERD (gastroesophageal reflux disease)   . H/O hiatal hernia   . Headache(784.0)     migraines  . Hypertension   . Atypical chest pain     a. 02/2014 Cath: nl cors, EF 65-70%->d/c'd on nsaid and PPI.    Past Surgical History  Procedure Laterality Date  . Cesarean section  4/04  . Hernia repair  04/2009    abdominal hernia  . Wisdom tooth extraction  1999  . Eye surgery  11/2010    lasar repair of retinal tear  . Tubal ligation    . Esophagogastroduodenoscopy  12/30/2011    Procedure: ESOPHAGOGASTRODUODENOSCOPY (EGD);  Surgeon: Ladene Artist, MD,FACG;  Location: [facility];  Service: Endoscopy;  Laterality: N/A;  . Laparoscopic total hysterectomy  03/17/13  . Abdominal hysterectomy    . Left heart catheterization with coronary angiogram N/A 03/07/2014    Procedure: LEFT HEART CATHETERIZATION WITH CORONARY ANGIOGRAM;  Surgeon: Blane Ohara, MD;  Location: [facility] CATH LAB;  Service: Cardiovascular;  Laterality: N/A;    There were no vitals filed for this visit.  Visit Diagnosis:  Weakness of left  leg  Decreased ROM of lumbar spine  Joint stiffness of spine  Abnormal posture  Left low back pain, with sciatica presence unspecified  Abnormality of gait      Subjective Assessment - 12/21/14 0903    Subjective "I am having increased pain in my in hip L with pain into the L leg with increased swell in the foot"   Currently in Pain? Yes   Pain Score 7    Pain Location Back   Pain Orientation Left;Lower   Pain Type Chronic pain   Pain Onset More than a month ago   Pain Frequency Constant   Aggravating Factors  walking and movement   Pain Relieving Factors prone press ups                         [facility] Adult PT Treatment/Exercise - 12/21/14 0001    Lumbar Exercises: Supine   Ab Set 10 reps   Other Supine Lumbar Exercises lumbar decompression with hip flexion 2 x 15  with feet on physioball   Lumbar Exercises: Prone   Other Prone Lumbar Exercises prone press-ups 3 x 10   Knee/Hip Exercises: Stretches   Passive Hamstring Stretch Left;2 reps;30 seconds   Other Knee/Hip Stretches standing repeated extension x 15   Moist Heat Therapy   Number Minutes  Moist Heat 15 Minutes   Moist Heat Location Lumbar Spine  in prone   Manual Therapy   Soft tissue mobilization DTM/ trigger point release of bil glute med/min, noted referral of symptoms during treatment with noted decresaed pain following treatment                PT Education - 12/21/14 0936    Education provided Yes   Education Details educasted regarding maintaining postions and decompression exercises   Person(s) Educated Patient   Methods Explanation   Comprehension Verbalized understanding          PT Short Term Goals - 12/14/14 1012    PT SHORT TERM GOAL #1   Title pt will be I with basic HEP 10/21/2014   Time 4   Period Weeks   Status Achieved   PT SHORT TERM GOAL #2   Title pt will decrease low back pain to < 5/10 during and following standing for > 10-15 min to assist with  exercise progression 10/21/2014   Time 4   Period Weeks   Status Achieved   PT SHORT TERM GOAL #3   Title pt will increase LLE strength to > 4/5 to assist with decreasing her fear of falling 10/21/2014   Time 4   Period Weeks   Status On-going   PT SHORT TERM GOAL #4   Title pt will increase FOTO score by >10 points to assist with functional progression 10/21/2014   Time 4   Period Weeks   Status Unable to assess   PT SHORT TERM GOAL #5   Title pt will increase trunk mobility by > 10 degrees in all planes to assist with ADLs 10/21/2014   Time 4   Period Weeks   Status Partially Met           PT Long Term Goals - 12/14/14 1013    PT LONG TERM GOAL #1   Title pt will be I with advanced HEP 11/15/14   Time 8   Period Weeks   Status On-going   PT LONG TERM GOAL #2   Title Pt will increase trunk mobility in all planes by > 20 degrees to assist with functional mobility 11/15/14   Time 8   Period Weeks   Status On-going   PT LONG TERM GOAL #3   Title pt will exhibit <3/10 pain during prolonged standing or walking for >30 min to assist with personal goal of playing with kids in water gun fight 11/15/14   Time 8   Period Weeks   Status On-going   PT LONG TERM GOAL #4   Title pt will be able to verbalize and demonstrate techniques to reduce risk or reinjury of the low back via postural awarenss, lifting and carrying mechanics, and HEp 11/15/14   Period Weeks   Status Achieved   PT LONG TERM GOAL #5   Title pt will increase her FOTO score to > 50 to assist with her funcitonal capacity 11/15/14   Time 8   Period Weeks   Status Unable to assess               Plan - 12/21/14 0947    Clinical Impression Statement Channelle arrived 15 minutes late today. She present to therapy today with increased pain in the low back with incresaed referral of pain down the L leg. She reports doing her hair while sitting on the floor and had difficulty getting up. She was able to perform prone press  ups  without increased symptoms and reported significant relese of pain and muscle tightness following trigger point rlease of bil glute med/min.  educated on lumbar decompression exercises and she reported feeling better after completing decompression activity. Plan to progress next visit as tolerated.    PT Next Visit Plan prone pressups with over pressure to L side low back, peforming standing back extensions at counter, quadraped exercises for core, L hamstring stretching, SLR, L innominate mobility?  FOTO   PT Home Exercise Plan decompression exercies   Consulted and Agree with Plan of Care Patient        Problem List Patient Active Problem List   Diagnosis Date Noted  . Low back pain with left-sided sciatica 07/28/2014  . Exertional chest pain 03/07/2014  . Unstable angina 03/07/2014  . Essential hypertension 03/07/2014  . Abnormal ECG 03/07/2014  . Chest pain at rest 03/07/2014  . Chest pain 02/02/2014  . HTN (hypertension) 07/04/2013  . Heart murmur, systolic 02/40/9735  . Extreme obesity 03/07/2013  . Obstructive apnea 03/07/2013  . Encounter for routine gynecological examination 06/30/2012  . Atypical chest pain 04/05/2012  . GERD (gastroesophageal reflux disease) 12/30/2011  . Other dysphagia 12/30/2011  . 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 Chronic cough 10/31/2011  . General medical examination 05/27/2011  . OSA (obstructive sleep apnea) 08/30/2010  . HIP PAIN, LEFT 03/13/2010  . Morbid obesity 10/17/2009  . ALLERGIC RHINITIS 09/19/2009  . Asthma 09/19/2009   Starr Lake PT, DPT, LAT, ATC  12/21/2014  10:04 AM      [facility] [address] [place], [place], [address] Phone: [phone]   Fax:  [phone]         PHYSICAL THERAPY DISCHARGE SUMMARY  Visits from Start of Care: 15  Current functional level related to goals / functional outcomes: FOTO 58% limited   Remaining deficits: Low back pain,  limited trunk mobility   Education / Equipment: HEP, theraband for strengthening.   Plan:                                                    Patient goals were not met. Patient is being discharged due to not returning since the last visit.  ?????        Elliet Goodnow PT, DPT, LAT, ATC  01/18/2015  2:06 PM

## 2014-12-25 ENCOUNTER — Encounter: Payer: 59 | Admitting: Physical Therapy

## 2014-12-26 ENCOUNTER — Ambulatory Visit: Payer: 59 | Admitting: Family Medicine

## 2014-12-27 ENCOUNTER — Encounter: Payer: Self-pay | Admitting: Family Medicine

## 2014-12-27 ENCOUNTER — Ambulatory Visit: Payer: 59 | Admitting: Family Medicine

## 2014-12-28 ENCOUNTER — Ambulatory Visit: Payer: 59 | Admitting: Physical Therapy

## 2015-01-01 ENCOUNTER — Encounter: Payer: 59 | Admitting: Physical Therapy

## 2015-01-04 ENCOUNTER — Ambulatory Visit: Payer: 59 | Admitting: Physical Therapy

## 2015-01-08 ENCOUNTER — Encounter: Payer: 59 | Admitting: Physical Therapy

## 2015-01-08 ENCOUNTER — Ambulatory Visit: Payer: 59 | Admitting: Family Medicine

## 2015-01-11 ENCOUNTER — Encounter: Payer: 59 | Admitting: Physical Therapy

## 2015-02-12 ENCOUNTER — Institutional Professional Consult (permissible substitution): Payer: 59 | Admitting: Internal Medicine

## 2015-02-27 ENCOUNTER — Ambulatory Visit (INDEPENDENT_AMBULATORY_CARE_PROVIDER_SITE_OTHER): Payer: 59

## 2015-02-27 DIAGNOSIS — Z23 Encounter for immunization: Secondary | ICD-10-CM | POA: Diagnosis not present

## 2015-05-16 ENCOUNTER — Telehealth: Payer: Self-pay | Admitting: Family Medicine

## 2015-05-16 NOTE — Telephone Encounter (Signed)
Relation to WU:JWJXpt:self Call back [phone]   Reason for call:  Patient handicap place card has expired and would like to know how she should go about getting it renewed.

## 2015-05-17 NOTE — Telephone Encounter (Signed)
This was done at time of her son's office visit yesterday

## 2015-05-23 ENCOUNTER — Encounter (HOSPITAL_COMMUNITY): Payer: Self-pay

## 2015-05-23 ENCOUNTER — Emergency Department (HOSPITAL_COMMUNITY)
Admission: EM | Admit: 2015-05-23 | Discharge: 2015-05-23 | Disposition: A | Discharge status: Home / Self Care | Payer: 59 | Attending: Emergency Medicine | Admitting: Emergency Medicine

## 2015-05-23 DIAGNOSIS — R109 Unspecified abdominal pain: Secondary | ICD-10-CM | POA: Diagnosis not present

## 2015-05-23 DIAGNOSIS — R350 Frequency of micturition: Secondary | ICD-10-CM | POA: Insufficient documentation

## 2015-05-23 DIAGNOSIS — Z79899 Other long term (current) drug therapy: Secondary | ICD-10-CM | POA: Insufficient documentation

## 2015-05-23 DIAGNOSIS — Z91040 Latex allergy status: Secondary | ICD-10-CM | POA: Diagnosis not present

## 2015-05-23 DIAGNOSIS — K219 Gastro-esophageal reflux disease without esophagitis: Secondary | ICD-10-CM | POA: Insufficient documentation

## 2015-05-23 DIAGNOSIS — M545 Low back pain, unspecified: Secondary | ICD-10-CM

## 2015-05-23 DIAGNOSIS — J45909 Unspecified asthma, uncomplicated: Secondary | ICD-10-CM | POA: Diagnosis not present

## 2015-05-23 DIAGNOSIS — Z9981 Dependence on supplemental oxygen: Secondary | ICD-10-CM | POA: Insufficient documentation

## 2015-05-23 DIAGNOSIS — M549 Dorsalgia, unspecified: Secondary | ICD-10-CM | POA: Diagnosis present

## 2015-05-23 DIAGNOSIS — I10 Essential (primary) hypertension: Secondary | ICD-10-CM | POA: Diagnosis not present

## 2015-05-23 DIAGNOSIS — R300 Dysuria: Secondary | ICD-10-CM | POA: Diagnosis not present

## 2015-05-23 DIAGNOSIS — Z9889 Other specified postprocedural states: Secondary | ICD-10-CM | POA: Diagnosis not present

## 2015-05-23 DIAGNOSIS — G4733 Obstructive sleep apnea (adult) (pediatric): Secondary | ICD-10-CM | POA: Diagnosis not present

## 2015-05-23 LAB — URINALYSIS, ROUTINE W REFLEX MICROSCOPIC
Bilirubin Urine: NEGATIVE
Glucose, UA: NEGATIVE mg/dL
HGB URINE DIPSTICK: NEGATIVE
Ketones, ur: NEGATIVE mg/dL
Leukocytes, UA: NEGATIVE
Nitrite: NEGATIVE
PH: 6 (ref 5.0–8.0)
Protein, ur: NEGATIVE mg/dL
SPECIFIC GRAVITY, URINE: 1.018 (ref 1.005–1.030)

## 2015-05-23 MED ORDER — METHOCARBAMOL 500 MG PO TABS: 500.0000 mg | ORAL_TABLET | Freq: Two times a day (BID) | ORAL | Status: DC

## 2015-05-23 MED ORDER — HYDROMORPHONE HCL 1 MG/ML IJ SOLN
1.0000 mg | Freq: Once | INTRAMUSCULAR | Status: AC
  Administered 2015-05-23: 1 mg via INTRAMUSCULAR
  Filled 2015-05-23: qty 1

## 2015-05-23 MED ORDER — DIAZEPAM 5 MG PO TABS
5.0000 mg | ORAL_TABLET | Freq: Once | ORAL | Status: AC
  Administered 2015-05-23: 5 mg via ORAL
  Filled 2015-05-23: qty 1

## 2015-05-23 MED ORDER — OXYCODONE-ACETAMINOPHEN 5-325 MG PO TABS: 1.0000 | ORAL_TABLET | ORAL | Status: DC | PRN

## 2015-05-23 MED ORDER — NAPROXEN 500 MG PO TABS: 500.0000 mg | ORAL_TABLET | Freq: Two times a day (BID) | ORAL | Status: DC

## 2015-05-23 NOTE — ED Provider Notes (Signed)
CSN: [id number]     Arrival date & time 05/23/15  2130 History   First MD Initiated Contact with Patient 05/23/15 1004     Chief Complaint  Patient presents with  . Back Pain     (Consider location/radiation/quality/duration/timing/severity/associated sxs/prior Treatment) HPI Amber Salas is a 40 y.o. female with hx of asthma, obesity, GERD, HTN, presents to ED with complaint of right sided back pain. Pt states she injured her back 1 year ago. She states that she has been followed by neurosurgery, and has had multiple back injections as well as course of physical therapy. She reports being out of work for almost 5 months because of this pain. She states she went back to work 2 days ago. She states that she did not do anything strenuous, however she did unload her box of things in the office. She states since that evening her pain has gotten worse. She now reports pain on the right side of the back where her before her pain was on the left. She states this pain in the right side does not radiate into her legs. She denies any problems controlling her bladder or bowels. She states that she is having urinary frequency and felt like she was coming down with a bladder infection. She denies a fever or chills. No nausea or vomiting. She took Aleve yesterday and tramadol today which did not help. Patient states that she is able to walk only a few steps due to pain.  Past Medical History  Diagnosis Date  . Asthma     history of  . Retinal hole or tear     left eye- 1996 due to injury  . Obstructive sleep apnea on CPAP   . Extreme obesity (HCC)   . OSA (obstructive sleep apnea) 08/30/2010  . GERD (gastroesophageal reflux disease)   . H/O hiatal hernia   . Headache(784.0)     migraines  . Hypertension   . Atypical chest pain     a. 02/2014 Cath: nl cors, EF 65-70%->d/c'd on nsaid and PPI.   Past Surgical History  Procedure Laterality Date  . Cesarean section  4/04  . Hernia repair  04/2009     abdominal hernia  . Wisdom tooth extraction  1999  . Eye surgery  11/2010    lasar repair of retinal tear  . Tubal ligation    . Esophagogastroduodenoscopy  12/30/2011    Procedure: ESOPHAGOGASTRODUODENOSCOPY (EGD);  Surgeon: [name] Dare, MD,FACG;  Location: [facility];  Service: Endoscopy;  Laterality: N/A;  . Laparoscopic total hysterectomy  03/17/13  . Abdominal hysterectomy    . Left heart catheterization with coronary angiogram N/A 03/07/2014    Procedure: LEFT HEART CATHETERIZATION WITH CORONARY ANGIOGRAM;  Surgeon: Micheline Chapman, MD;  Location: [facility] CATH LAB;  Service: Cardiovascular;  Laterality: N/A;   Family History  Problem Relation Age of Onset  . Hypertension Mother   . Diabetes type II Mother   . Other Mother     cervical dysplasia  . Ovarian cancer Mother   . Hypertension Father   . Bipolar disorder Father   . Heart disease Maternal Grandmother   . Hypertension Maternal Grandmother   . Bipolar disorder Maternal Grandmother   . Hypertension Maternal Grandfather   . Hypertension Paternal Grandmother   . Hypertension Paternal Grandfather   . Hypertension Sister   . Heart attack Neg Hx   . Hyperlipidemia Neg Hx   . Sudden death Neg Hx    Social History  Substance Use Topics  . Smoking status: Never Smoker   . Smokeless tobacco: Never Used  . Alcohol Use: No   OB History    No data available     Review of Systems  Constitutional: Negative for fever and chills.  Respiratory: Negative for cough, chest tightness and shortness of breath.   Cardiovascular: Negative for chest pain, palpitations and leg swelling.  Gastrointestinal: Negative for nausea, vomiting, abdominal pain and diarrhea.  Genitourinary: Positive for dysuria, frequency and flank pain. Negative for vaginal pain and pelvic pain.  Musculoskeletal: Positive for back pain. Negative for myalgias, arthralgias, neck pain and neck stiffness.  Skin: Negative for rash.  Neurological: Negative for  dizziness, weakness and headaches.  All other systems reviewed and are negative.     Allergies  Amlodipine; Peppermint oil; Fluticasone-salmeterol; Latex; and Spinach  Home Medications   Prior to Admission medications   Medication Sig Start Date End Date Taking? Authorizing Provider  albuterol (PROAIR HFA) 108 (90 BASE) MCG/ACT inhaler Inhale 2 puffs into the lungs every 4 (four) hours as needed for wheezing or shortness of breath. 01/14/13   Sandford CrazeMelissa O'Sullivan, NP  albuterol (PROVENTIL) (2.5 MG/3ML) 0.083% nebulizer solution Take 3 mLs (2.5 mg total) by nebulization every 4 (four) hours as needed. For shortness of breath or wheezing 01/14/13   Sandford CrazeMelissa O'Sullivan, NP  amoxicillin (AMOXIL) 875 MG tablet  10/23/14   Historical Provider, MD  beclomethasone (QVAR) 40 MCG/ACT inhaler Inhale 2 puffs into the lungs 2 (two) times daily. Take 2 puffs first thing in the Am and then another 2 puffs about 12 hours later. 02/09/13   Sandford CrazeMelissa O'Sullivan, NP  cetirizine (ZYRTEC) 10 MG tablet Take 10 mg by mouth daily.    Historical Provider, MD  cyclobenzaprine (FLEXERIL) 10 MG tablet Take 1 tablet (10 mg total) by mouth every 8 (eight) hours as needed. 07/26/14   Lenda KelpShane R Hudnall, MD  hydrochlorothiazide (MICROZIDE) 12.5 MG capsule Take 1 capsule (12.5 mg total) by mouth daily. Patient not taking: Reported on 10/03/2014 03/08/14   Ok Anishristopher R Berge, NP  HYDROcodone-acetaminophen (NORCO) 5-325 MG per tablet Take 1 tablet by mouth every 6 (six) hours as needed for moderate pain. Patient not taking: Reported on 10/03/2014 07/26/14   Lenda KelpShane R Hudnall, MD  Multiple Vitamin (MULITIVITAMIN WITH MINERALS) TABS Take 1 tablet by mouth daily.    Historical Provider, MD  omeprazole (PRILOSEC OTC) 20 MG tablet Take 1 tablet (20 mg total) by mouth daily. Patient not taking: Reported on 09/20/2014 03/08/14   Ok Anishristopher R Berge, NP  tetrahydrozoline-zinc (VISINE-AC) 0.05-0.25 % ophthalmic solution Place 2 drops into both eyes 3  (three) times daily as needed (dry itchy eyes).    Historical Provider, MD  Vitamin D, Ergocalciferol, (DRISDOL) 50000 UNITS CAPS capsule Take 1 capsule (50,000 Units total) by mouth every 7 (seven) days. 10/06/14   Sheliah HatchKatherine E Tabori, MD   BP 143/87 mmHg  Pulse 81  Temp(Src) 98.2 F (36.8 C) (Oral)  Resp 18  SpO2 97%  LMP 06/27/2012 Physical Exam  Constitutional: She appears well-developed and well-nourished. No distress.  Morbidly obese  HENT:  Head: Normocephalic.  Eyes: Conjunctivae are normal.  Neck: Neck supple.  Cardiovascular: Normal rate, regular rhythm and normal heart sounds.   Pulmonary/Chest: Effort normal and breath sounds normal. No respiratory distress. She has no wheezes. She has no rales.  Musculoskeletal: She exhibits no edema.  Tender to palpation over midline and right paravertebral muscles over the lumbar spine. Pain with bilateral straight  leg raise. Strength with dorsiflexion and plantarflexion of foot and toes is intact and is 5 out of 5 and equal bilaterally.  Neurological: She is alert.  5/5 and equal lower extremity strength. 2+ and equal patellar reflexes bilaterally. Pt able to dorsiflex bilateral toes and feet with good strength against resistance. Equal sensation bilaterally over thighs and lower legs.   Skin: Skin is warm and dry.  Psychiatric: She has a normal mood and affect. Her behavior is normal.  Nursing note and vitals reviewed.   ED Course  Procedures (including critical care time) Labs Review Labs Reviewed  URINALYSIS, ROUTINE W REFLEX MICROSCOPIC (NOT AT [facility]) - Abnormal; Notable for the following:    APPearance CLOUDY (*)    All other components within normal limits    Imaging Review No results found. I have personally reviewed and evaluated these images and lab results as part of my medical decision-making.   EKG Interpretation None      MDM   Final diagnoses:  Right-sided low back pain without sciatica    patient with  worsening back pain after returning to work 3 days ago. Pain is in the right lower back. It is different from the pain she had before. She denies any pain radiation down her extremities. No trouble controlling her bladder or bowels. She does have dysuria. Will check urinalysis and try some pain medications. No evidence of cauda equina on today's exam. She is afebrile. Nontoxic appearing.  11:46 AM Patient received 1 mg of Dilaudid IM and 5 mg of Valium by mouth. She states she feels much better. Her pain is still there but improved. Her urinalysis is negative. Will discharge home with Percocet, Robaxin, ibuprofen. I will have her follow-up with her doctor. Return precautions discussed. I also spent approximately 50 minutes with patient discussing weight loss, diet, exercise splint. Advised her to try to follow up with nutritionist. She will join some water classes at [facility].  Filed Vitals:   05/23/15 0957  BP: 143/87  Pulse: 81  Temp: 98.2 F (36.8 C)  TempSrc: Oral  Resp: 18  SpO2: 97%     Jaynie Crumble, PA-C 05/23/15 1210  Raeford Razor, MD 05/23/15 1701

## 2015-05-23 NOTE — Discharge Instructions (Signed)
Try heating pads for your back. Take naproxen for pain and inflammation. Take Percocet for severe pain. Please follow-up with your doctor for recheck and further tests or imaging is needed.   Back Pain, Adult Back pain is very common in adults.The cause of back pain is rarely dangerous and the pain often gets better over time.The cause of your back pain may not be known. Some common causes of back pain include:  Strain of the muscles or ligaments supporting the spine.  Wear and tear (degeneration) of the spinal disks.  Arthritis.  Direct injury to the back. For many people, back pain may return. Since back pain is rarely dangerous, most people can learn to manage this condition on their own. HOME CARE INSTRUCTIONS Watch your back pain for any changes. The following actions may help to lessen any discomfort you are feeling:  Remain active. It is stressful on your back to sit or stand in one place for long periods of time. Do not sit, drive, or stand in one place for more than 30 minutes at a time. Take short walks on even surfaces as soon as you are able.Try to increase the length of time you walk each day.  Exercise regularly as directed by your health care provider. Exercise helps your back heal faster. It also helps avoid future injury by keeping your muscles strong and flexible.  Do not stay in bed.Resting more than 1-2 days can delay your recovery.  Pay attention to your body when you bend and lift. The most comfortable positions are those that put less stress on your recovering back. Always use proper lifting techniques, including:  Bending your knees.  Keeping the load close to your body.  Avoiding twisting.  Find a comfortable position to sleep. Use a firm mattress and lie on your side with your knees slightly bent. If you lie on your back, put a pillow under your knees.  Avoid feeling anxious or stressed.Stress increases muscle tension and can worsen back pain.It is  important to recognize when you are anxious or stressed and learn ways to manage it, such as with exercise.  Take medicines only as directed by your health care provider. Over-the-counter medicines to reduce pain and inflammation are often the most helpful.Your health care provider may prescribe muscle relaxant drugs.These medicines help dull your pain so you can more quickly return to your normal activities and healthy exercise.  Apply ice to the injured area:  Put ice in a plastic bag.  Place a towel between your skin and the bag.  Leave the ice on for 20 minutes, 2-3 times a day for the first 2-3 days. After that, ice and heat may be alternated to reduce pain and spasms.  Maintain a healthy weight. Excess weight puts extra stress on your back and makes it difficult to maintain good posture. SEEK MEDICAL CARE IF:  You have pain that is not relieved with rest or medicine.  You have increasing pain going down into the legs or buttocks.  You have pain that does not improve in one week.  You have night pain.  You lose weight.  You have a fever or chills. SEEK IMMEDIATE MEDICAL CARE IF:   You develop new bowel or bladder control problems.  You have unusual weakness or numbness in your arms or legs.  You develop nausea or vomiting.  You develop abdominal pain.  You feel faint.   This information is not intended to replace advice given to you by your  health care provider. Make sure you discuss any questions you have with your health care provider.   Document Released: 04/28/2005 Document Revised: 05/19/2014 Document Reviewed: 08/30/2013 Elsevier Interactive Patient Education [employer].

## 2015-05-23 NOTE — ED Notes (Signed)
Pt has had off/on back pain for a year. Pain has worsened since Monday when at work.  Pt has had increase frequency in urination also.  Pain more localized to left lower back. No fever.  Nausea from pain.

## 2015-07-25 ENCOUNTER — Telehealth: Payer: Self-pay | Admitting: Family Medicine

## 2015-07-25 NOTE — Telephone Encounter (Signed)
Called and LMOVM for pt to return call. I need to know what she is in therapy for? Who placed PT referral? Per our last notes placard was renewed in January.

## 2015-07-25 NOTE — Telephone Encounter (Signed)
Pt called in to request an extension on or a new handicap sticker until she complete physical therapy. Pt says that her current one expires on the end of this month. Pt says that she should be complete with PT in June.    Pt would like to be advised further.   CB: [phone]

## 2015-07-27 NOTE — Telephone Encounter (Signed)
Called pt and LMOVM again today.

## 2015-07-31 NOTE — Telephone Encounter (Signed)
Closing encounter until pt returns call.  

## 2015-08-16 ENCOUNTER — Ambulatory Visit: Payer: 59 | Admitting: Family Medicine

## 2015-08-16 DIAGNOSIS — Z0289 Encounter for other administrative examinations: Secondary | ICD-10-CM

## 2015-08-17 ENCOUNTER — Encounter: Payer: Self-pay | Admitting: Family Medicine

## 2015-08-17 ENCOUNTER — Telehealth: Payer: Self-pay | Admitting: Family Medicine

## 2015-08-17 NOTE — Telephone Encounter (Signed)
Yes- charge.  Pt called and stated she was going to be 15-20 minutes late and never showed up

## 2015-08-17 NOTE — Telephone Encounter (Signed)
Pt was no show 08/16/15 2:00pm for acute appt, pt has not rescheduled, 1st no show, charge or no charge?

## 2015-08-17 NOTE — Telephone Encounter (Signed)
Marked to charge, mailing no show letter °

## 2016-09-08 ENCOUNTER — Emergency Department (HOSPITAL_BASED_OUTPATIENT_CLINIC_OR_DEPARTMENT_OTHER)
Admission: EM | Admit: 2016-09-08 | Discharge: 2016-09-08 | Disposition: A | Discharge status: Home / Self Care | Payer: Medicaid Other | Attending: Emergency Medicine | Admitting: Emergency Medicine

## 2016-09-08 ENCOUNTER — Encounter (HOSPITAL_BASED_OUTPATIENT_CLINIC_OR_DEPARTMENT_OTHER): Payer: Self-pay | Admitting: Emergency Medicine

## 2016-09-08 DIAGNOSIS — J45909 Unspecified asthma, uncomplicated: Secondary | ICD-10-CM | POA: Diagnosis not present

## 2016-09-08 DIAGNOSIS — I10 Essential (primary) hypertension: Secondary | ICD-10-CM | POA: Diagnosis not present

## 2016-09-08 DIAGNOSIS — R1110 Vomiting, unspecified: Secondary | ICD-10-CM | POA: Diagnosis not present

## 2016-09-08 DIAGNOSIS — Z7729 Contact with and (suspected ) exposure to other hazardous substances: Secondary | ICD-10-CM

## 2016-09-08 DIAGNOSIS — T588X1A Toxic effect of carbon monoxide from other source, accidental (unintentional), initial encounter: Secondary | ICD-10-CM | POA: Diagnosis not present

## 2016-09-08 DIAGNOSIS — Z79899 Other long term (current) drug therapy: Secondary | ICD-10-CM | POA: Insufficient documentation

## 2016-09-08 LAB — BASIC METABOLIC PANEL
Anion gap: 6 (ref 5–15)
BUN: 12 mg/dL (ref 6–20)
CALCIUM: 9.3 mg/dL (ref 8.9–10.3)
CO2: 27 mmol/L (ref 22–32)
CREATININE: 0.79 mg/dL (ref 0.44–1.00)
Chloride: 103 mmol/L (ref 101–111)
GFR calc non Af Amer: >60 mL/min (ref >60)
Glucose, Bld: 123 mg/dL — ABNORMAL HIGH (ref 65–99)
Potassium: 4.1 mmol/L (ref 3.5–5.1)
Sodium: 136 mmol/L (ref 135–145)

## 2016-09-08 LAB — CBC
HEMATOCRIT: 38.3 % (ref 36.0–46.0)
Hemoglobin: 12.5 g/dL (ref 12.0–15.0)
MCH: 28.9 pg (ref 26.0–34.0)
MCHC: 32.6 g/dL (ref 30.0–36.0)
MCV: 88.5 fL (ref 78.0–100.0)
Platelets: 297 10*3/uL (ref 150–400)
RBC: 4.33 MIL/uL (ref 3.87–5.11)
RDW: 14.4 % (ref 11.5–15.5)
WBC: 11.8 10*3/uL — ABNORMAL HIGH (ref 4.0–10.5)

## 2016-09-08 LAB — CARBOXYHEMOGLOBIN - COOX: Carboxyhemoglobin: 0.6 % (ref 0.5–1.5)

## 2016-09-08 MED ORDER — IPRATROPIUM-ALBUTEROL 0.5-2.5 (3) MG/3ML IN SOLN
RESPIRATORY_TRACT | Status: AC
  Administered 2016-09-08: 3 mL
  Filled 2016-09-08: qty 3

## 2016-09-08 NOTE — ED Notes (Signed)
Pt c/o HA that is now resolved after receiving 100% O2 via NRB.

## 2016-09-08 NOTE — ED Notes (Signed)
100% NRB removed, SpO2 100%, SpCO% reading 1.
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
## 2016-09-08 NOTE — ED Notes (Signed)
Carboxyhemoglobin pulse ox:  2

## 2016-09-08 NOTE — ED Notes (Signed)
Alert, NAD, calm, interactive, resps e/u, speaking in clear complete sentences, no dyspnea noted, skin W&D, VSS, mentions HA lessened 3/10, chest tightness/sob improved, no longer dizzy, (denies: nausea, dizziness or visual changes). Family at [facility].

## 2017-08-28 ENCOUNTER — Other Ambulatory Visit: Payer: Self-pay

## 2017-08-28 ENCOUNTER — Emergency Department (HOSPITAL_COMMUNITY)
Admission: EM | Admit: 2017-08-28 | Discharge: 2017-08-29 | Disposition: A | Discharge status: Home / Self Care | Payer: Self-pay | Attending: Emergency Medicine | Admitting: Emergency Medicine

## 2017-08-28 ENCOUNTER — Emergency Department (HOSPITAL_COMMUNITY): Payer: Self-pay

## 2017-08-28 DIAGNOSIS — Z91040 Latex allergy status: Secondary | ICD-10-CM | POA: Insufficient documentation

## 2017-08-28 DIAGNOSIS — J45901 Unspecified asthma with (acute) exacerbation: Secondary | ICD-10-CM | POA: Insufficient documentation

## 2017-08-28 DIAGNOSIS — Z79899 Other long term (current) drug therapy: Secondary | ICD-10-CM | POA: Insufficient documentation

## 2017-08-28 DIAGNOSIS — I10 Essential (primary) hypertension: Secondary | ICD-10-CM | POA: Insufficient documentation

## 2017-08-28 LAB — BASIC METABOLIC PANEL
ANION GAP: 10 (ref 5–15)
BUN: 13 mg/dL (ref 6–20)
CALCIUM: 9.4 mg/dL (ref 8.9–10.3)
CO2: 22 mmol/L (ref 22–32)
Chloride: 103 mmol/L (ref 101–111)
Creatinine, Ser: 0.75 mg/dL (ref 0.44–1.00)
GLUCOSE: 221 mg/dL — AB (ref 65–99)
POTASSIUM: 3.9 mmol/L (ref 3.5–5.1)
Sodium: 135 mmol/L (ref 135–145)

## 2017-08-28 LAB — CBC
HEMATOCRIT: 38.8 % (ref 36.0–46.0)
HEMOGLOBIN: 12.8 g/dL (ref 12.0–15.0)
MCH: 29.2 pg (ref 26.0–34.0)
MCHC: 33 g/dL (ref 30.0–36.0)
MCV: 88.6 fL (ref 78.0–100.0)
Platelets: 335 10*3/uL (ref 150–400)
RBC: 4.38 MIL/uL (ref 3.87–5.11)
RDW: 13.5 % (ref 11.5–15.5)
WBC: 13.2 10*3/uL — ABNORMAL HIGH (ref 4.0–10.5)

## 2017-08-28 LAB — TROPONIN I

## 2017-08-28 MED ORDER — ALBUTEROL SULFATE (2.5 MG/3ML) 0.083% IN NEBU
5.0000 mg | INHALATION_SOLUTION | Freq: Once | RESPIRATORY_TRACT | Status: AC
  Administered 2017-08-28: 5 mg via RESPIRATORY_TRACT
  Filled 2017-08-28: qty 6

## 2017-08-28 NOTE — ED Triage Notes (Signed)
Patient c/o SOB all day; has been using nebs and MDI with no relief.

## 2017-08-29 MED ORDER — ALBUTEROL (5 MG/ML) CONTINUOUS INHALATION SOLN
10.0000 mg/h | INHALATION_SOLUTION | RESPIRATORY_TRACT | Status: AC
  Administered 2017-08-29: 10 mg/h via RESPIRATORY_TRACT
  Filled 2017-08-29: qty 20

## 2017-08-29 MED ORDER — AZITHROMYCIN 250 MG PO TABS
500.0000 mg | ORAL_TABLET | Freq: Once | ORAL | Status: AC
  Administered 2017-08-29: 500 mg via ORAL
  Filled 2017-08-29: qty 2

## 2017-08-29 MED ORDER — AZITHROMYCIN 250 MG PO TABS: 250.0000 mg | ORAL_TABLET | Freq: Every day | ORAL | 0 refills | Status: DC

## 2017-08-29 MED ORDER — ACETAMINOPHEN 500 MG PO TABS
1000.0000 mg | ORAL_TABLET | Freq: Once | ORAL | Status: AC
  Administered 2017-08-29: 1000 mg via ORAL
  Filled 2017-08-29: qty 2

## 2017-08-29 MED ORDER — ALBUTEROL SULFATE (2.5 MG/3ML) 0.083% IN NEBU
5.0000 mg | INHALATION_SOLUTION | Freq: Once | RESPIRATORY_TRACT | Status: AC
  Administered 2017-08-29: 5 mg via RESPIRATORY_TRACT
  Filled 2017-08-29: qty 6

## 2017-08-29 MED ORDER — METHYLPREDNISOLONE SODIUM SUCC 125 MG IJ SOLR
125.0000 mg | Freq: Once | INTRAMUSCULAR | Status: AC
Start: 2017-08-29 — End: 2017-08-29
  Administered 2017-08-29: 125 mg via INTRAVENOUS
  Filled 2017-08-29: qty 2

## 2017-08-29 MED ORDER — PREDNISONE 20 MG PO TABS: 40.0000 mg | ORAL_TABLET | Freq: Every day | ORAL | 0 refills | Status: DC

## 2017-08-29 NOTE — ED Provider Notes (Signed)
[facility]   CSN: [id number] Arrival date & time: 08/28/17  2229     History   Chief Complaint Chief Complaint  Patient presents with  . Shortness of Breath    HPI Amber Salas is a 42 y.o. female.  Patient presents to the emergency department with a chief complaint of shortness of breath.  She reports history of asthma.  States that she has been using nebulizer treatments daily for the past couple of days, but her symptoms significantly worsened today.  She reports history of allergies, and believes that the pollen is aggravating her symptoms.  She denies any fevers or chills.  She does report a nonproductive cough.  She denies any other associated symptoms.  Denies any leg swelling or chest pain.  She states that she does feel tight in her chest and has had associated wheezing.  The history is provided by the patient. No language interpreter was used.    Past Medical History:  Diagnosis Date  . Asthma    history of  . Atypical chest pain    a. 02/2014 Cath: nl cors, EF 65-70%->d/c'd on nsaid and PPI.  [name]. Extreme obesity   . GERD (gastroesophageal reflux disease)   . H/O hiatal hernia   . Headache(784.0)    migraines  . Hypertension   . Obstructive sleep apnea on CPAP   . OSA (obstructive sleep apnea) 08/30/2010  . Retinal hole or tear    left eye- 1996 due to injury    Patient Active Problem List   Diagnosis Date Noted  . Low back pain with left-sided sciatica 07/28/2014  . Exertional chest pain 03/07/2014  . Unstable angina (HCC) 03/07/2014  . Essential hypertension 03/07/2014  . Abnormal ECG 03/07/2014  . Chest pain at rest 03/07/2014  . Chest pain 02/02/2014  . HTN (hypertension) 07/04/2013  . Heart murmur, systolic 03/08/2013  . Extreme obesity 03/07/2013  . Obstructive apnea 03/07/2013  . Encounter for routine gynecological examination 06/30/2012  . Atypical chest pain 04/05/2012  . GERD  (gastroesophageal reflux disease) 12/30/2011  . Other dysphagia 12/30/2011  . 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 Chronic cough 10/31/2011  . General medical examination 05/27/2011  . OSA (obstructive sleep apnea) 08/30/2010  . HIP PAIN, LEFT 03/13/2010  . Morbid obesity (HCC) 10/17/2009  . ALLERGIC RHINITIS 09/19/2009  . Asthma 09/19/2009    Past Surgical History:  Procedure Laterality Date  . ABDOMINAL HYSTERECTOMY    . CESAREAN SECTION  4/04  . ESOPHAGOGASTRODUODENOSCOPY  12/30/2011   Procedure: ESOPHAGOGASTRODUODENOSCOPY (EGD);  Surgeon: [name] DareMalcolm T Stark, MD,FACG;  [facility];  Service: Endoscopy;  Laterality: N/A;  . EYE SURGERY  11/2010   lasar repair of retinal tear  . HERNIA REPAIR  04/2009   abdominal hernia  . LAPAROSCOPIC TOTAL HYSTERECTOMY  03/17/13  . LEFT HEART CATHETERIZATION WITH CORONARY ANGIOGRAM N/A 03/07/2014   Procedure: LEFT HEART CATHETERIZATION WITH CORONARY ANGIOGRAM;  Surgeon: Micheline ChapmanMichael D Cooper, MD;  Location[facility];  Service: Cardiovascular;  Laterality: N/A;  . TUBAL LIGATION    . WISDOM TOOTH EXTRACTION  1999     OB History   None      Home Medications    Prior to Admission medications   Medication Sig Start Date End Date Taking? Authorizing Provider  albuterol (PROAIR HFA) 108 (90 BASE) MCG/ACT inhaler Inhale 2 puffs into the lungs every 4 (four) hours as needed for wheezing or shortness of breath. 01/14/13  Yes  Sandford Craze, NP  albuterol (PROVENTIL) (2.5 MG/3ML) 0.083% nebulizer solution Take 3 mLs (2.5 mg total) by nebulization every 4 (four) hours as needed. For shortness of breath or wheezing 01/14/13  Yes Sandford Craze, NP  cetirizine (ZYRTEC) 10 MG tablet Take 10 mg by mouth daily.   Yes [provider]  tiotropium (SPIRIVA) 18 MCG inhalation capsule Place 18 mcg into inhaler and inhale daily.   Yes [provider]  hydrochlorothiazide (MICROZIDE) 12.5 MG capsule Take 1 capsule (12.5 mg total) by mouth  daily. Patient not taking: Reported on 10/03/2014 03/08/14   Creig Hines, NP  naproxen (NAPROSYN) 500 MG tablet Take 1 tablet (500 mg total) by mouth 2 (two) times daily. Patient not taking: Reported on 08/29/2017 05/23/15   Jaynie Crumble, PA-C  omeprazole (PRILOSEC OTC) 20 MG tablet Take 1 tablet (20 mg total) by mouth daily. Patient not taking: Reported on 09/20/2014 03/08/14   Creig Hines, NP  Vitamin D, Ergocalciferol, (DRISDOL) 50000 UNITS CAPS capsule Take 1 capsule (50,000 Units total) by mouth every 7 (seven) days. Patient not taking: Reported on 08/29/2017 10/06/14   Sheliah Hatch, MD    Family History Family History  Problem Relation Age of Onset  . Heart disease Maternal Grandmother   . Hypertension Maternal Grandmother   . Bipolar disorder Maternal Grandmother   . Hypertension Maternal Grandfather   . Hypertension Mother   . Diabetes type II Mother   . Other Mother        cervical dysplasia  . Ovarian cancer Mother   . Hypertension Father   . Bipolar disorder Father   . Hypertension Paternal Grandmother   . Hypertension Paternal Grandfather   . Hypertension Sister   . Heart attack Neg Hx   . Hyperlipidemia Neg Hx   . Sudden death Neg Hx     Social History Social History   Tobacco Use  . Smoking status: Never Smoker  . Smokeless tobacco: Never Used  Substance Use Topics  . Alcohol use: No    Alcohol/week: 0.0 oz  . Drug use: No     Allergies   Amlodipine; Peppermint oil; Fluticasone-salmeterol; Latex; and Spinach   Review of Systems Review of Systems  All other systems reviewed and are negative.    Physical Exam Updated Vital Signs BP (!) 146/109   Pulse 100   Temp 98.5 F (36.9 C) (Oral)   Resp 18   Ht 5\' 6"  (1.676 m)   Wt (!) 201.9 kg (445 lb)   LMP 06/27/2012   SpO2 97%   BMI 71.82 kg/m   Physical Exam  Constitutional: She is oriented to person, place, and time. She appears well-developed and  well-nourished.  HENT:  Head: Normocephalic and atraumatic.  Eyes: Pupils are equal, round, and reactive to light. Conjunctivae and EOM are normal.  Neck: Normal range of motion. Neck supple.  Cardiovascular: Normal rate and regular rhythm. Exam reveals no gallop and no friction rub.  No murmur heard. Pulmonary/Chest: She has no wheezes. She has no rales. She exhibits no tenderness.  Mildly increased work of breathing with diminished lung sounds bilaterally  Abdominal: Soft. Bowel sounds are normal. She exhibits no distension and no mass. There is no tenderness. There is no rebound and no guarding.  Musculoskeletal: Normal range of motion. She exhibits no edema or tenderness.  Neurological: She is alert and oriented to person, place, and time.  Skin: Skin is warm and dry.  Psychiatric: She has a normal mood  and affect. Her behavior is normal. Judgment and thought content normal.  Nursing note and vitals reviewed.    ED Treatments / Results  Labs (all labs ordered are listed, but only abnormal results are displayed) Labs Reviewed  BASIC METABOLIC PANEL - Abnormal; Notable for the following components:      Result Value   Glucose, Bld 221 (*)    All other components within normal limits  CBC - Abnormal; Notable for the following components:   WBC 13.2 (*)    All other components within normal limits  TROPONIN I    EKG None  Radiology Dg Chest 2 View  Result Date: 08/28/2017 CLINICAL DATA:  Initial evaluation for acute shortness of breath. EXAM: CHEST - 2 VIEW COMPARISON:  Prior radiograph from 07/17/2016. FINDINGS: Cardiac and mediastinal silhouettes are stable in size and contour, and remain within normal limits. History of asthma. Lungs mildly hypoinflated. Scattered end central peribronchial thickening, suspected to be related history of asthma. No focal infiltrates. No pulmonary edema or pleural effusion. No pneumothorax. No acute osseus abnormality. IMPRESSION: Prominent  diffuse peribronchial thickening, suspected to be related to history of asthma. Bronchiolitis could be considered in the correct clinical setting. Electronically Signed   By: Rise Mu M.D.   On: 08/28/2017 23:21    Procedures Procedures (including critical care time)  Medications Ordered in ED Medications  albuterol (PROVENTIL,VENTOLIN) solution continuous neb (10 mg/hr Nebulization New Bag/Given 08/29/17 0321)  albuterol (PROVENTIL) (2.5 MG/3ML) 0.083% nebulizer solution 5 mg (5 mg Nebulization Given 08/28/17 2242)  albuterol (PROVENTIL) (2.5 MG/3ML) 0.083% nebulizer solution 5 mg (5 mg Nebulization Given 08/29/17 0116)  methylPREDNISolone sodium succinate (SOLU-MEDROL) 125 mg/2 mL injection 125 mg (125 mg Intravenous Given 08/29/17 0328)  acetaminophen (TYLENOL) tablet 1,000 mg (1,000 mg Oral Given 08/29/17 0328)     Initial Impression / Assessment and Plan / ED Course  I have reviewed the triage vital signs and the nursing notes.  Pertinent labs & imaging results that were available during my care of the patient were reviewed by me and considered in my medical decision making (see chart for details).     Patient with shortness of breath and wheezing.  O2 saturation is normal.  Mildly increased work of breathing.  Patient is able to speak in complete sentences.  Has had 2 breathing treatments already, will give continuous albuterol treatment as well as Solu-Medrol.  Will reassess.  Patient feels improved after continuous albuterol treatment.  Will ambulate with O2 monitor and reassess.    Patient ambulates maintaining greater than 95% pulse oxygenation.  Wheezing has improved.  Still complains of some cough and shortness of breath.  Will discharge to home with azithro, prednisone.  Return precautions given.  Final Clinical Impressions(s) / ED Diagnoses   Final diagnoses:  Exacerbation of asthma, unspecified asthma severity, unspecified whether persistent    ED Discharge  Orders        Ordered    azithromycin (ZITHROMAX) 250 MG tablet  Daily     08/29/17 0601    predniSONE (DELTASONE) 20 MG tablet  Daily     08/29/17 0601       [name], PA-C 08/29/17 0602    Azalia Bilis, MD 08/30/17 1610    Azalia Bilis, MD 08/30/17 [phone]

## 2018-05-17 ENCOUNTER — Other Ambulatory Visit: Payer: Self-pay | Admitting: Family Medicine

## 2018-05-17 ENCOUNTER — Ambulatory Visit
Admission: RE | Admit: 2018-05-17 | Discharge: 2018-05-17 | Disposition: A | Discharge status: Home / Self Care | Payer: BLUE CROSS/BLUE SHIELD | Source: Ambulatory Visit | Attending: Family Medicine | Admitting: Family Medicine

## 2018-05-17 DIAGNOSIS — Z1231 Encounter for screening mammogram for malignant neoplasm of breast: Secondary | ICD-10-CM

## 2019-06-23 ENCOUNTER — Other Ambulatory Visit: Payer: Self-pay | Admitting: Family Medicine

## 2019-06-23 DIAGNOSIS — Z1231 Encounter for screening mammogram for malignant neoplasm of breast: Secondary | ICD-10-CM

## 2019-07-02 ENCOUNTER — Encounter (HOSPITAL_COMMUNITY): Payer: Self-pay

## 2019-07-02 ENCOUNTER — Ambulatory Visit (HOSPITAL_COMMUNITY)
Admission: EM | Admit: 2019-07-02 | Discharge: 2019-07-02 | Disposition: A | Discharge status: Home / Self Care | Payer: BC Managed Care – PPO | Attending: Emergency Medicine | Admitting: Emergency Medicine

## 2019-07-02 ENCOUNTER — Other Ambulatory Visit: Payer: Self-pay

## 2019-07-02 ENCOUNTER — Ambulatory Visit (INDEPENDENT_AMBULATORY_CARE_PROVIDER_SITE_OTHER): Payer: BC Managed Care – PPO

## 2019-07-02 DIAGNOSIS — S99922A Unspecified injury of left foot, initial encounter: Secondary | ICD-10-CM

## 2019-07-02 MED ORDER — NAPROXEN 500 MG PO TABS: 500.0000 mg | ORAL_TABLET | Freq: Two times a day (BID) | ORAL | 0 refills | Status: AC

## 2019-07-02 NOTE — ED Triage Notes (Signed)
Pt presents with left 3rd toe injury from a few days ago; pt states she stubbed it on furniture.

## 2019-07-02 NOTE — Discharge Instructions (Signed)
NO fracture Naprosyn twice daily with food Ice and elevate May use ace wrap and buddy tape for comfort Follow up if not improving

## 2019-07-03 NOTE — ED Provider Notes (Signed)
[facility]    CSN: [id number] Arrival date & time: 07/02/19  1720      History   Chief Complaint Chief Complaint  Patient presents with  . Toe Injury    HPI Amber Salas is a 44 y.o. female presenting today for evaluation of toe/foot injury.  Patient states that 2 to 3 days ago she jammed her toe/foot into a wooden cabinet.  Since she has had pain especially with weightbearing.  At times will have the numbness tingling sensation radiating up into leg.  Does not have this paresthesia of breast.  Denies prior fracture to foot.  Has been using Aleve with some relief of symptoms.  Denies calf pain.  HPI  Past Medical History:  Diagnosis Date  . Asthma    history of  . Atypical chest pain    a. 02/2014 Cath: nl cors, EF 65-70%->d/c'd on nsaid and PPI.  [name] Extreme obesity   . GERD (gastroesophageal reflux disease)   . H/O hiatal hernia   . Headache(784.0)    migraines  . Hypertension   . Obstructive sleep apnea on CPAP   . OSA (obstructive sleep apnea) 08/30/2010  . Retinal hole or tear    left eye- 1996 due to injury    Patient Active Problem List   Diagnosis Date Noted  . Low back pain with left-sided sciatica 07/28/2014  . Exertional chest pain 03/07/2014  . Unstable angina (HCC) 03/07/2014  . Essential hypertension 03/07/2014  . Abnormal ECG 03/07/2014  . Chest pain at rest 03/07/2014  . Chest pain 02/02/2014  . HTN (hypertension) 07/04/2013  . Heart murmur, systolic 03/08/2013  . Extreme obesity 03/07/2013  . Obstructive apnea 03/07/2013  . Encounter for routine gynecological examination 06/30/2012  . Atypical chest pain 04/05/2012  . GERD (gastroesophageal reflux disease) 12/30/2011  . Other dysphagia 12/30/2011  . 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 Chronic cough 10/31/2011  . General medical examination 05/27/2011  . OSA (obstructive sleep apnea) 08/30/2010  . HIP PAIN, LEFT 03/13/2010  . Morbid obesity (HCC) 10/17/2009  . ALLERGIC RHINITIS  09/19/2009  . Asthma 09/19/2009    Past Surgical History:  Procedure Laterality Date  . ABDOMINAL HYSTERECTOMY    . CESAREAN SECTION  4/04  . ESOPHAGOGASTRODUODENOSCOPY  12/30/2011   Procedure: ESOPHAGOGASTRODUODENOSCOPY (EGD);  Surgeon: [name] Dare, MD,FACG;  Location: [facility];  Service: Endoscopy;  Laterality: N/A;  . EYE SURGERY  11/2010   lasar repair of retinal tear  . HERNIA REPAIR  04/2009   abdominal hernia  . LAPAROSCOPIC TOTAL HYSTERECTOMY  03/17/13  . LEFT HEART CATHETERIZATION WITH CORONARY ANGIOGRAM N/A 03/07/2014   Procedure: LEFT HEART CATHETERIZATION WITH CORONARY ANGIOGRAM;  Surgeon: Micheline Chapman, MD;  Location: [facility] CATH LAB;  Service: Cardiovascular;  Laterality: N/A;  . TUBAL LIGATION    . WISDOM TOOTH EXTRACTION  1999    OB History   No obstetric history on file.      Home Medications    Prior to Admission medications   Medication Sig Start Date End Date Taking? Authorizing Provider  albuterol (PROAIR HFA) 108 (90 BASE) MCG/ACT inhaler Inhale 2 puffs into the lungs every 4 (four) hours as needed for wheezing or shortness of breath. 01/14/13   Sandford Craze, NP  albuterol (PROVENTIL) (2.5 MG/3ML) 0.083% nebulizer solution Take 3 mLs (2.5 mg total) by nebulization every 4 (four) hours as needed. For shortness of breath or wheezing 01/14/13   Sandford Craze, NP  cetirizine (ZYRTEC) 10 MG tablet  Take 10 mg by mouth daily.    [provider]  hydrochlorothiazide (MICROZIDE) 12.5 MG capsule Take 1 capsule (12.5 mg total) by mouth daily. Patient not taking: Reported on 10/03/2014 03/08/14   Theora Gianotti, NP  naproxen (NAPROSYN) 500 MG tablet Take 1 tablet (500 mg total) by mouth 2 (two) times daily. 07/02/19   Merve Hotard C, PA-C  omeprazole (PRILOSEC OTC) 20 MG tablet Take 1 tablet (20 mg total) by mouth daily. Patient not taking: Reported on 09/20/2014 03/08/14   Theora Gianotti, NP  tiotropium [facility]) 18 MCG  inhalation capsule Place 18 mcg into inhaler and inhale daily.    [provider]  Vitamin D, Ergocalciferol, (DRISDOL) 50000 UNITS CAPS capsule Take 1 capsule (50,000 Units total) by mouth every 7 (seven) days. Patient not taking: Reported on 08/29/2017 10/06/14   Midge Minium, MD    Family History Family History  Problem Relation Age of Onset  . Heart disease Maternal Grandmother   . Hypertension Maternal Grandmother   . Bipolar disorder Maternal Grandmother   . Hypertension Maternal Grandfather   . Hypertension Mother   . Diabetes type II Mother   . Other Mother        cervical dysplasia  . Ovarian cancer Mother   . Hypertension Father   . Bipolar disorder Father   . Hypertension Paternal Grandmother   . Hypertension Paternal Grandfather   . Hypertension Sister   . Heart attack Neg Hx   . Hyperlipidemia Neg Hx   . Sudden death Neg Hx   . Breast cancer Neg Hx     Social History Social History   Tobacco Use  . Smoking status: Never Smoker  . Smokeless tobacco: Never Used  Substance Use Topics  . Alcohol use: No    Alcohol/week: 0.0 standard drinks  . Drug use: No     Allergies   Amlodipine, Peppermint oil, Fluticasone-salmeterol, Latex, and Spinach   Review of Systems Review of Systems  Constitutional: Negative for fatigue and fever.  Eyes: Negative for visual disturbance.  Respiratory: Negative for shortness of breath.   Cardiovascular: Negative for chest pain.  Gastrointestinal: Negative for abdominal pain, nausea and vomiting.  Musculoskeletal: Positive for arthralgias and gait problem. Negative for joint swelling.  Skin: Negative for color change, rash and wound.  Neurological: Negative for dizziness, weakness, light-headedness and headaches.     Physical Exam Triage Vital Signs ED Triage Vitals  Enc Vitals Group     BP 07/02/19 1807 (!) 160/106     Pulse Rate 07/02/19 1807 96     Resp 07/02/19 1807 17     Temp 07/02/19 1807 98.4 F  (36.9 C)     Temp Source 07/02/19 1807 Oral     SpO2 07/02/19 1807 100 %     Weight --      Height --      Head Circumference --      Peak Flow --      Pain Score 07/02/19 1806 7     Pain Loc --      Pain Edu? --      Excl. in [place]? --    No data found.  Updated Vital Signs BP (!) 160/106 (BP Location: Right Arm)   Pulse 96   Temp 98.4 F (36.9 C) (Oral)   Resp 17   LMP 06/27/2012   SpO2 100%   Visual Acuity Right Eye Distance:   Left Eye Distance:   Bilateral Distance:  Right Eye Near:   Left Eye Near:    Bilateral Near:     Physical Exam Vitals and nursing note reviewed.  Constitutional:      Appearance: She is well-developed.     Comments: No acute distress  HENT:     Head: Normocephalic and atraumatic.     Nose: Nose normal.  Eyes:     Conjunctiva/sclera: Conjunctivae normal.  Cardiovascular:     Rate and Rhythm: Normal rate.  Pulmonary:     Effort: Pulmonary effort is normal. No respiratory distress.  Abdominal:     General: There is no distension.  Musculoskeletal:        General: Normal range of motion.     Cervical back: Neck supple.     Comments: Right foot: No obvious swelling deformity or discoloration, tender to palpation along the length of the third toe extending into third distal meta tarsal, nontender to palpation to first, second and fifth metatarsal areas, nontender to medial lateral malleolus, full active range of motion at ankle, no calf tenderness  Skin:    General: Skin is warm and dry.  Neurological:     Mental Status: She is alert and oriented to person, place, and time.      UC Treatments / Results  Labs (all labs ordered are listed, but only abnormal results are displayed) Labs Reviewed - No data to display  EKG   Radiology DG Foot Complete Left  Result Date: 07/02/2019 CLINICAL DATA:  Pain to 3rd digit EXAM: LEFT FOOT - COMPLETE 3+ VIEW COMPARISON:  None. FINDINGS: No fracture or dislocation is seen. The joint spaces  are preserved. The visualized soft tissues are unremarkable. Moderate posterior calcaneal enthesophyte. IMPRESSION: Negative. Electronically Signed   By: Charline Bills M.D.   On: 07/02/2019 18:32    Procedures Procedures (including critical care time)  Medications Ordered in UC Medications - No data to display  Initial Impression / Assessment and Plan / UC Course  I have reviewed the triage vital signs and the nursing notes.  Pertinent labs & imaging results that were available during my care of the patient were reviewed by me and considered in my medical decision making (see chart for details).     X-ray negative for fracture.  Most likely contusion/sprain.  Recommending anti-inflammatories rest, ice and elevation.  May buddy tape for comfort.  Discussed strict return precautions. Patient verbalized understanding and is agreeable with plan.   Final Clinical Impressions(s) / UC Diagnoses   Final diagnoses:  Toe injury, left, initial encounter     Discharge Instructions     NO fracture Naprosyn twice daily with food Ice and elevate May use ace wrap and buddy tape for comfort Follow up if not improving   ED Prescriptions    Medication Sig Dispense Auth. Provider   naproxen (NAPROSYN) 500 MG tablet Take 1 tablet (500 mg total) by mouth 2 (two) times daily. 30 tablet Pihu Basil, [place] C, PA-C     PDMP not reviewed this encounter.   Sharyon Cable [place] C, PA-C 07/03/19 1037

## 2019-08-02 ENCOUNTER — Ambulatory Visit: Payer: BLUE CROSS/BLUE SHIELD

## 2019-08-20 ENCOUNTER — Ambulatory Visit: Payer: BC Managed Care – PPO | Attending: Internal Medicine

## 2019-08-20 DIAGNOSIS — Z23 Encounter for immunization: Secondary | ICD-10-CM

## 2019-08-20 NOTE — Progress Notes (Signed)
   Covid-19 Vaccination Clinic  Name:  Amber Salas    MRN: [id number] DOB: 04/19/1976  08/20/2019  Amber Salas was observed post Covid-19 immunization for 15 minutes without incident. She was provided with Vaccine Information Sheet and instruction to access the V-Safe system.   Amber Salas was instructed to call 911 with any severe reactions post vaccine: [name] Difficulty breathing  . Swelling of face and throat  . A fast heartbeat  . A bad rash all over body  . Dizziness and weakness   Immunizations Administered    Name Date Dose VIS Date Route   Moderna COVID-19 Vaccine 08/20/2019  1:01 PM 0.5 mL 04/12/2019 Intramuscular   Manufacturer: Moderna   Lot: [id number]   NDC: 48185-631-49

## 2019-09-17 ENCOUNTER — Ambulatory Visit: Payer: BC Managed Care – PPO | Attending: Internal Medicine

## 2019-09-17 DIAGNOSIS — Z23 Encounter for immunization: Secondary | ICD-10-CM

## 2019-09-17 NOTE — Progress Notes (Signed)
   Covid-19 Vaccination Clinic  Name:  Amber Salas    MRN: [id number] DOB: [birth year]  09/17/2019  Amber Salas was observed post Covid-19 immunization for 15 minutes without incident. She was provided with Vaccine Information Sheet and instruction to access the V-Safe system.   Amber Salas was instructed to call 911 with any severe reactions post vaccine: [name] Difficulty breathing  . Swelling of face and throat  . A fast heartbeat  . A bad rash all over body  . Dizziness and weakness   Immunizations Administered    Name Date Dose VIS Date Route   Moderna COVID-19 Vaccine 09/17/2019  1:43 PM 0.5 mL 04/2019 Intramuscular   Manufacturer: Moderna   Lot: [id number]   NDC: 48016-553-74

## 2020-02-14 ENCOUNTER — Ambulatory Visit: Payer: BC Managed Care – PPO

## 2020-10-25 ENCOUNTER — Emergency Department (HOSPITAL_BASED_OUTPATIENT_CLINIC_OR_DEPARTMENT_OTHER): Payer: BC Managed Care – PPO | Admitting: Radiology

## 2020-10-25 ENCOUNTER — Encounter (HOSPITAL_BASED_OUTPATIENT_CLINIC_OR_DEPARTMENT_OTHER): Payer: Self-pay | Admitting: Emergency Medicine

## 2020-10-25 ENCOUNTER — Emergency Department (HOSPITAL_BASED_OUTPATIENT_CLINIC_OR_DEPARTMENT_OTHER)
Admission: EM | Admit: 2020-10-25 | Discharge: 2020-10-25 | Disposition: A | Discharge status: Home / Self Care | Payer: BC Managed Care – PPO | Attending: Emergency Medicine | Admitting: Emergency Medicine

## 2020-10-25 ENCOUNTER — Other Ambulatory Visit: Payer: Self-pay

## 2020-10-25 DIAGNOSIS — J3489 Other specified disorders of nose and nasal sinuses: Secondary | ICD-10-CM | POA: Insufficient documentation

## 2020-10-25 DIAGNOSIS — R0981 Nasal congestion: Secondary | ICD-10-CM | POA: Diagnosis not present

## 2020-10-25 DIAGNOSIS — R0789 Other chest pain: Secondary | ICD-10-CM | POA: Insufficient documentation

## 2020-10-25 DIAGNOSIS — Z91040 Latex allergy status: Secondary | ICD-10-CM | POA: Insufficient documentation

## 2020-10-25 DIAGNOSIS — Z79899 Other long term (current) drug therapy: Secondary | ICD-10-CM | POA: Insufficient documentation

## 2020-10-25 DIAGNOSIS — J45909 Unspecified asthma, uncomplicated: Secondary | ICD-10-CM | POA: Diagnosis not present

## 2020-10-25 DIAGNOSIS — R0602 Shortness of breath: Secondary | ICD-10-CM | POA: Insufficient documentation

## 2020-10-25 DIAGNOSIS — Z7951 Long term (current) use of inhaled steroids: Secondary | ICD-10-CM | POA: Diagnosis not present

## 2020-10-25 DIAGNOSIS — R42 Dizziness and giddiness: Secondary | ICD-10-CM | POA: Insufficient documentation

## 2020-10-25 DIAGNOSIS — I10 Essential (primary) hypertension: Secondary | ICD-10-CM | POA: Insufficient documentation

## 2020-10-25 DIAGNOSIS — E1169 Type 2 diabetes mellitus with other specified complication: Secondary | ICD-10-CM | POA: Insufficient documentation

## 2020-10-25 LAB — COMPREHENSIVE METABOLIC PANEL
ALT: 13 U/L (ref 0–44)
AST: 8 U/L — ABNORMAL LOW (ref 15–41)
Albumin: 4 g/dL (ref 3.5–5.0)
Alkaline Phosphatase: 67 U/L (ref 38–126)
Anion gap: 11 (ref 5–15)
BUN: 14 mg/dL (ref 6–20)
CO2: 26 mmol/L (ref 22–32)
Calcium: 9.6 mg/dL (ref 8.9–10.3)
Chloride: 103 mmol/L (ref 98–111)
Creatinine, Ser: 0.69 mg/dL (ref 0.44–1.00)
GFR, Estimated: >60 mL/min (ref >60)
Glucose, Bld: 179 mg/dL — ABNORMAL HIGH (ref 70–99)
Potassium: 4.2 mmol/L (ref 3.5–5.1)
Sodium: 140 mmol/L (ref 135–145)
Total Bilirubin: 0.5 mg/dL (ref 0.3–1.2)
Total Protein: 7.6 g/dL (ref 6.5–8.1)

## 2020-10-25 LAB — CBC WITH DIFFERENTIAL/PLATELET
Abs Immature Granulocytes: 0.07 10*3/uL (ref 0.00–0.07)
Basophils Absolute: 0 10*3/uL (ref 0.0–0.1)
Basophils Relative: 0 %
Eosinophils Absolute: 0.2 10*3/uL (ref 0.0–0.5)
Eosinophils Relative: 1 %
HCT: 41.8 % (ref 36.0–46.0)
Hemoglobin: 13.8 g/dL (ref 12.0–15.0)
Immature Granulocytes: 1 %
Lymphocytes Relative: 31 %
Lymphs Abs: 4.3 10*3/uL — ABNORMAL HIGH (ref 0.7–4.0)
MCH: 29.1 pg (ref 26.0–34.0)
MCHC: 33 g/dL (ref 30.0–36.0)
MCV: 88 fL (ref 80.0–100.0)
Monocytes Absolute: 0.8 10*3/uL (ref 0.1–1.0)
Monocytes Relative: 6 %
Neutro Abs: 8.3 10*3/uL — ABNORMAL HIGH (ref 1.7–7.7)
Neutrophils Relative %: 61 %
Platelets: 404 10*3/uL — ABNORMAL HIGH (ref 150–400)
RBC: 4.75 MIL/uL (ref 3.87–5.11)
RDW: 13.8 % (ref 11.5–15.5)
WBC: 13.7 10*3/uL — ABNORMAL HIGH (ref 4.0–10.5)
nRBC: 0 % (ref 0.0–0.2)

## 2020-10-25 LAB — BRAIN NATRIURETIC PEPTIDE: B Natriuretic Peptide: 95.7 pg/mL (ref 0.0–100.0)

## 2020-10-25 LAB — TROPONIN I (HIGH SENSITIVITY): Troponin I (High Sensitivity): 3 ng/L (ref <18)

## 2020-10-25 MED ORDER — IPRATROPIUM-ALBUTEROL 0.5-2.5 (3) MG/3ML IN SOLN
3.0000 mL | Freq: Once | RESPIRATORY_TRACT | Status: AC
  Administered 2020-10-25: 3 mL via RESPIRATORY_TRACT
  Filled 2020-10-25: qty 3

## 2020-10-25 NOTE — ED Notes (Signed)
Taken to xray.

## 2020-10-25 NOTE — ED Provider Notes (Signed)
[facility]   CSN: [id number] Arrival date & time: 10/25/20  0946     History Chief Complaint  Patient presents with  . Shortness of Breath    Anne NgYolonda Y Skillman is a 45 y.o. female.  45 year old female with past medical history below including hypertension, type 2 diabetes mellitus, OSA, asthma, morbid obesity who presents with shortness of breath.  Patient was evaluated at urgent care on 6/10 for cough associated with shortness of breath, nasal congestion, and sinus pressure.  She was prescribed steroids and azithromycin which she took but has continued to have persistent symptoms.  She had a telehealth visit with her pulmonologist today regarding her symptoms and they prescribed an extended course of steroids and cefdinir.  She reports that she continues to have shortness of breath and chest tightness like she cannot take a deep breath in.  Normally her rescue inhaler relieves the symptom but she states she has been using it 3-4 times a day without improvement.  She is compliant with all of her medications.  She denies any fevers or sick contacts.  No leg swelling or pain.  No recent travel, history of blood clots, or history of cancer.  She denies any orthopnea.  She notes that her shortness of breath is worse first thing in the morning when she wakes up and when she tries to walk up stairs. After her telemedicine visit, she started feeling dizzy and was scared to be left alone at the house, which prompted her to come to ED.   The history is provided by the patient.  Shortness of Breath     Past Medical History:  Diagnosis Date  . Asthma    history of  . Atypical chest pain    a. 02/2014 Cath: nl cors, EF 65-70%->d/c'd on nsaid and PPI.  [name]. Extreme obesity   . GERD (gastroesophageal reflux disease)   . H/O hiatal hernia   . Headache(784.0)    migraines  . Hypertension   . Obstructive sleep apnea on CPAP   . OSA (obstructive sleep  apnea) 08/30/2010  . Retinal hole or tear    left eye- 1996 due to injury    Patient Active Problem List   Diagnosis Date Noted  . Low back pain with left-sided sciatica 07/28/2014  . Exertional chest pain 03/07/2014  . Unstable angina (HCC) 03/07/2014  . Essential hypertension 03/07/2014  . Abnormal ECG 03/07/2014  . Chest pain at rest 03/07/2014  . Chest pain 02/02/2014  . HTN (hypertension) 07/04/2013  . Heart murmur, systolic 03/08/2013  . Extreme obesity 03/07/2013  . Obstructive apnea 03/07/2013  . Encounter for routine gynecological examination 06/30/2012  . Atypical chest pain 04/05/2012  . GERD (gastroesophageal reflux disease) 12/30/2011  . Other dysphagia 12/30/2011  . 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 Chronic cough 10/31/2011  . General medical examination 05/27/2011  . OSA (obstructive sleep apnea) 08/30/2010  . HIP PAIN, LEFT 03/13/2010  . Morbid obesity (HCC) 10/17/2009  . ALLERGIC RHINITIS 09/19/2009  . Asthma 09/19/2009    Past Surgical History:  Procedure Laterality Date  . ABDOMINAL HYSTERECTOMY    . CESAREAN SECTION  4/04  . ESOPHAGOGASTRODUODENOSCOPY  12/30/2011   Procedure: ESOPHAGOGASTRODUODENOSCOPY (EGD);  Surgeon: [name] DareMalcolm T Stark, MD,FACG;  [facility];  Service: Endoscopy;  Laterality: N/A;  . EYE SURGERY  11/2010   lasar repair of retinal tear  . HERNIA REPAIR  04/2009   abdominal hernia  . LAPAROSCOPIC TOTAL HYSTERECTOMY  03/17/13  .  LEFT HEART CATHETERIZATION WITH CORONARY ANGIOGRAM N/A 03/07/2014   Procedure: LEFT HEART CATHETERIZATION WITH CORONARY ANGIOGRAM;  Surgeon: Micheline Chapman, MD;  Location: [facility] CATH LAB;  Service: Cardiovascular;  Laterality: N/A;  . TUBAL LIGATION    . WISDOM TOOTH EXTRACTION  1999     OB History   No obstetric history on file.     Family History  Problem Relation Age of Onset  . Heart disease Maternal Grandmother   . Hypertension Maternal Grandmother   . Bipolar disorder Maternal Grandmother   .  Hypertension Maternal Grandfather   . Hypertension Mother   . Diabetes type II Mother   . Other Mother        cervical dysplasia  . Ovarian cancer Mother   . Hypertension Father   . Bipolar disorder Father   . Hypertension Paternal Grandmother   . Hypertension Paternal Grandfather   . Hypertension Sister   . Heart attack Neg Hx   . Hyperlipidemia Neg Hx   . Sudden death Neg Hx   . Breast cancer Neg Hx     Social History   Tobacco Use  . Smoking status: Never  . Smokeless tobacco: Never  Substance Use Topics  . Alcohol use: No    Alcohol/week: 0.0 standard drinks  . Drug use: No    Home Medications Prior to Admission medications   Medication Sig Start Date End Date Taking? Authorizing Provider  albuterol (PROAIR HFA) 108 (90 BASE) MCG/ACT inhaler Inhale 2 puffs into the lungs every 4 (four) hours as needed for wheezing or shortness of breath. 01/14/13   Sandford Craze, NP  albuterol (PROVENTIL) (2.5 MG/3ML) 0.083% nebulizer solution Take 3 mLs (2.5 mg total) by nebulization every 4 (four) hours as needed. For shortness of breath or wheezing 01/14/13   Sandford Craze, NP  cetirizine (ZYRTEC) 10 MG tablet Take 10 mg by mouth daily.    [provider]  hydrochlorothiazide (MICROZIDE) 12.5 MG capsule Take 1 capsule (12.5 mg total) by mouth daily. Patient not taking: Reported on 10/03/2014 03/08/14   Creig Hines, NP  naproxen (NAPROSYN) 500 MG tablet Take 1 tablet (500 mg total) by mouth 2 (two) times daily. 07/02/19   Wieters, Hallie C, PA-C  omeprazole (PRILOSEC OTC) 20 MG tablet Take 1 tablet (20 mg total) by mouth daily. Patient not taking: Reported on 09/20/2014 03/08/14   Creig Hines, NP  tiotropium [facility]) 18 MCG inhalation capsule Place 18 mcg into inhaler and inhale daily.    [provider]  Vitamin D, Ergocalciferol, (DRISDOL) 50000 UNITS CAPS capsule Take 1 capsule (50,000 Units total) by mouth every 7 (seven)  days. Patient not taking: Reported on 08/29/2017 10/06/14   Sheliah Hatch, MD    Allergies    Amlodipine, Peppermint oil, Fluticasone-salmeterol, Latex, and Spinach  Review of Systems   Review of Systems  Respiratory:  Positive for shortness of breath.   All other systems reviewed and are negative except that which was mentioned in HPI  Physical Exam Updated Vital Signs Pulse 100   Resp 15   Ht 5\' 7"  (1.702 m)   Wt (!) 177.8 kg   LMP 06/27/2012   SpO2 98%   BMI 61.40 kg/m   Physical Exam Vitals and nursing note reviewed.  Constitutional:      General: She is not in acute distress.    Appearance: Normal appearance. She is obese.  HENT:     Head: Normocephalic and atraumatic.  Eyes:  Conjunctiva/sclera: Conjunctivae normal.  Cardiovascular:     Rate and Rhythm: Normal rate and regular rhythm.     Heart sounds: Normal heart sounds. No murmur heard. Pulmonary:     Effort: Pulmonary effort is normal. No tachypnea or accessory muscle usage.     Breath sounds: Normal breath sounds. No wheezing.  Abdominal:     General: Abdomen is flat. Bowel sounds are normal. There is no distension.     Palpations: Abdomen is soft.     Tenderness: There is no abdominal tenderness.  Musculoskeletal:     Right lower leg: No edema.     Left lower leg: No edema.  Skin:    General: Skin is warm and dry.  Neurological:     Mental Status: She is alert and oriented to person, place, and time.     Comments: fluent  Psychiatric:        Mood and Affect: Mood normal.        Behavior: Behavior normal.    ED Results / Procedures / Treatments   Labs (all labs ordered are listed, but only abnormal results are displayed) Labs Reviewed  COMPREHENSIVE METABOLIC PANEL - Abnormal; Notable for the following components:      Result Value   Glucose, Bld 179 (*)    AST 8 (*)    All other components within normal limits  CBC WITH DIFFERENTIAL/PLATELET - Abnormal; Notable for the following  components:   WBC 13.7 (*)    Platelets 404 (*)    Neutro Abs 8.3 (*)    Lymphs Abs 4.3 (*)    All other components within normal limits  BRAIN NATRIURETIC PEPTIDE  TROPONIN I (HIGH SENSITIVITY)    EKG EKG Interpretation  Date/Time:  Thursday October 25 2020 09:55:31 EDT Ventricular Rate:  98 PR Interval:  196 QRS Duration: 76 QT Interval:  333 QTC Calculation: 426 R Axis:   26 Text Interpretation: Sinus rhythm Probable anteroseptal infarct, recent No significant change since last tracing Confirmed by Frederick Peers [phone]) on 10/25/2020 10:09:01 AM  Radiology DG Chest 2 View  Result Date: 10/25/2020 CLINICAL DATA:  Shortness of breath, chest tightness, history asthma, some chest pain, history asthma EXAM: CHEST - 2 VIEW COMPARISON:  06/22/2020 FINDINGS: Normal heart size, mediastinal contours, and pulmonary vascularity. Minimal chronic central peribronchial thickening consistent with history of asthma. No pulmonary infiltrate, pleural effusion, or pneumothorax. Osseous structures unremarkable. IMPRESSION: No acute abnormalities. Minimal chronic peribronchial thickening consistent with history of asthma. Electronically Signed   By: Ulyses Southward M.D.   On: 10/25/2020 10:55    Procedures Procedures   Medications Ordered in ED Medications  ipratropium-albuterol (DUONEB) 0.5-2.5 (3) MG/3ML nebulizer solution 3 mL (3 mLs Nebulization Given 10/25/20 1011)    ED Course  I have reviewed the triage vital signs and the nursing notes.  Pertinent labs & imaging results that were available during my care of the patient were reviewed by me and considered in my medical decision making (see chart for details).    MDM Rules/Calculators/A&P                          Clear breath sounds on exam, normal WOB, no distress. Normal O2 sat on RA. No change after duoneb. CXR clear. Labs show normal CMP, mild leukocytosis likely 2/2 steroids, negative trop and BNP.  No risk factors for PE and PERC  negative. Given normal BNP and clear CXR with no signs of volume overload  on exam, doubt CHF as cause of SOB. Trop and EKG reassuring against ACS and sx sound atypical for ACS. I discussed other possible contributing factors to SOB such as anxiety or obesity/deconditioning. I have recommended that she continue meds that she was prescribed by pulmonologist today and that she f/u in pulm clinic for reassessment.  Reviewed return precautions and she voiced understanding. Final Clinical Impression(s) / ED Diagnoses Final diagnoses:  Shortness of breath    Rx / DC Orders ED Discharge Orders     None        Vicki Pasqual, Ambrose Finland, MD 10/25/20 1108

## 2020-10-25 NOTE — ED Notes (Signed)
Assisted to use bathroom.  Steady gait noted.

## 2020-10-25 NOTE — ED Triage Notes (Signed)
Reports hx of asthma.  Using nebs and inhalers at home with no relief.  Had sob since earlier this week.

## 2020-12-23 ENCOUNTER — Encounter (HOSPITAL_BASED_OUTPATIENT_CLINIC_OR_DEPARTMENT_OTHER): Payer: Self-pay | Admitting: Emergency Medicine

## 2020-12-23 ENCOUNTER — Emergency Department (HOSPITAL_BASED_OUTPATIENT_CLINIC_OR_DEPARTMENT_OTHER)
Admission: EM | Admit: 2020-12-23 | Discharge: 2020-12-23 | Disposition: A | Discharge status: Home / Self Care | Payer: BC Managed Care – PPO | Attending: Emergency Medicine | Admitting: Emergency Medicine

## 2020-12-23 ENCOUNTER — Emergency Department (HOSPITAL_BASED_OUTPATIENT_CLINIC_OR_DEPARTMENT_OTHER): Payer: BC Managed Care – PPO

## 2020-12-23 DIAGNOSIS — R0602 Shortness of breath: Secondary | ICD-10-CM | POA: Diagnosis present

## 2020-12-23 DIAGNOSIS — R000 Tachycardia, unspecified: Secondary | ICD-10-CM | POA: Diagnosis not present

## 2020-12-23 DIAGNOSIS — E119 Type 2 diabetes mellitus without complications: Secondary | ICD-10-CM | POA: Diagnosis not present

## 2020-12-23 DIAGNOSIS — Z79899 Other long term (current) drug therapy: Secondary | ICD-10-CM | POA: Diagnosis not present

## 2020-12-23 DIAGNOSIS — U071 COVID-19: Secondary | ICD-10-CM | POA: Insufficient documentation

## 2020-12-23 DIAGNOSIS — Z91040 Latex allergy status: Secondary | ICD-10-CM | POA: Diagnosis not present

## 2020-12-23 DIAGNOSIS — J45909 Unspecified asthma, uncomplicated: Secondary | ICD-10-CM | POA: Insufficient documentation

## 2020-12-23 DIAGNOSIS — I10 Essential (primary) hypertension: Secondary | ICD-10-CM | POA: Insufficient documentation

## 2020-12-23 DIAGNOSIS — R112 Nausea with vomiting, unspecified: Secondary | ICD-10-CM

## 2020-12-23 HISTORY — DX: Type 2 diabetes mellitus without complications: E11.9

## 2020-12-23 LAB — URINALYSIS, ROUTINE W REFLEX MICROSCOPIC
Bilirubin Urine: NEGATIVE
Glucose, UA: >1000 mg/dL — AB
Hgb urine dipstick: NEGATIVE
Ketones, ur: NEGATIVE mg/dL
Leukocytes,Ua: NEGATIVE
Nitrite: NEGATIVE
Protein, ur: NEGATIVE mg/dL
Specific Gravity, Urine: 1.035 — ABNORMAL HIGH (ref 1.005–1.030)
pH: 5.5 (ref 5.0–8.0)

## 2020-12-23 LAB — COMPREHENSIVE METABOLIC PANEL
ALT: 13 U/L (ref 0–44)
AST: 8 U/L — ABNORMAL LOW (ref 15–41)
Albumin: 4.1 g/dL (ref 3.5–5.0)
Alkaline Phosphatase: 62 U/L (ref 38–126)
Anion gap: 10 (ref 5–15)
BUN: 16 mg/dL (ref 6–20)
CO2: 27 mmol/L (ref 22–32)
Calcium: 10.1 mg/dL (ref 8.9–10.3)
Chloride: 99 mmol/L (ref 98–111)
Creatinine, Ser: 0.81 mg/dL (ref 0.44–1.00)
GFR, Estimated: >60 mL/min (ref >60)
Glucose, Bld: 213 mg/dL — ABNORMAL HIGH (ref 70–99)
Potassium: 3.8 mmol/L (ref 3.5–5.1)
Sodium: 136 mmol/L (ref 135–145)
Total Bilirubin: 0.5 mg/dL (ref 0.3–1.2)
Total Protein: 7.6 g/dL (ref 6.5–8.1)

## 2020-12-23 LAB — PREGNANCY, URINE: Preg Test, Ur: NEGATIVE

## 2020-12-23 LAB — CBC
HCT: 43.8 % (ref 36.0–46.0)
Hemoglobin: 14.5 g/dL (ref 12.0–15.0)
MCH: 28.7 pg (ref 26.0–34.0)
MCHC: 33.1 g/dL (ref 30.0–36.0)
MCV: 86.6 fL (ref 80.0–100.0)
Platelets: 373 10*3/uL (ref 150–400)
RBC: 5.06 MIL/uL (ref 3.87–5.11)
RDW: 13.3 % (ref 11.5–15.5)
WBC: 12.7 10*3/uL — ABNORMAL HIGH (ref 4.0–10.5)
nRBC: 0 % (ref 0.0–0.2)

## 2020-12-23 LAB — TROPONIN I (HIGH SENSITIVITY)
Troponin I (High Sensitivity): 5 ng/L (ref <18)
Troponin I (High Sensitivity): 5 ng/L (ref <18)

## 2020-12-23 LAB — LIPASE, BLOOD: Lipase: 37 U/L (ref 11–51)

## 2020-12-23 MED ORDER — ONDANSETRON 4 MG PO TBDP: 4.0000 mg | ORAL_TABLET | Freq: Three times a day (TID) | ORAL | 0 refills | Status: DC | PRN

## 2020-12-23 MED ORDER — IOHEXOL 350 MG/ML SOLN
100.0000 mL | Freq: Once | INTRAVENOUS | Status: AC | PRN
  Administered 2020-12-23: 100 mL via INTRAVENOUS

## 2020-12-23 MED ORDER — ONDANSETRON HCL 4 MG/2ML IJ SOLN
4.0000 mg | Freq: Once | INTRAMUSCULAR | Status: AC
  Administered 2020-12-23: 4 mg via INTRAVENOUS
  Filled 2020-12-23: qty 2

## 2020-12-23 MED ORDER — LACTATED RINGERS IV BOLUS
1000.0000 mL | Freq: Once | INTRAVENOUS | Status: AC
  Administered 2020-12-23: 1000 mL via INTRAVENOUS

## 2020-12-23 NOTE — ED Triage Notes (Signed)
SOB and vomiting today. She was dx with covid two weeks ago and has been using albuterol and prednisone.

## 2020-12-23 NOTE — ED Provider Notes (Signed)
[facility] EMERGENCY DEPT Provider Note   CSN: [id number] Arrival date & time: 12/23/20  1324     History Chief Complaint  Patient presents with   Shortness of Breath   Emesis    Amber Salas is a 45 y.o. female.   Shortness of Breath Associated symptoms: vomiting   Associated symptoms: no abdominal pain, no chest pain, no cough, no diaphoresis, no ear pain, no fever, no headaches, no neck pain, no rash, no sore throat and no wheezing   Emesis Associated symptoms: no abdominal pain, no arthralgias, no chills, no cough, no fever, no headaches and no sore throat   Patient is a 45 year old female who presents for shortness of breath and vomiting.  Recent COVID diagnosis 10 days ago.  She had 2 days of fever but has otherwise been well.  She has been on a prednisone taper since diagnosis.  She felt well yesterday.  This morning, at 3 AM, she woke up with a chest pressure, shortness of breath.  She tried a home albuterol treatment.  She did not experience any relief with the treatment.  She subsequently developed nausea and vomiting.  She has had persistent chest tightness, shortness of breath, and nausea/vomiting throughout the day.  Medical history otherwise notable for asthma, obesity, HTN, and OSA.  She has no personal or family ACS history.    Past Medical History:  Diagnosis Date   Asthma    history of   Atypical chest pain    a. 02/2014 Cath: nl cors, EF 65-70%->d/c'd on nsaid and PPI.   Diabetes mellitus without complication (HCC)    Extreme obesity    GERD (gastroesophageal reflux disease)    H/O hiatal hernia    Headache(784.0)    migraines   Hypertension    Obstructive sleep apnea on CPAP    OSA (obstructive sleep apnea) 08/30/2010   Retinal hole or tear    left eye- 1996 due to injury    Patient Active Problem List   Diagnosis Date Noted   Low back pain with left-sided sciatica 07/28/2014   Exertional chest pain 03/07/2014   Unstable angina  (HCC) 03/07/2014   Essential hypertension 03/07/2014   Abnormal ECG 03/07/2014   Chest pain at rest 03/07/2014   Chest pain 02/02/2014   HTN (hypertension) 07/04/2013   Heart murmur, systolic 03/08/2013   Extreme obesity 03/07/2013   Obstructive apnea 03/07/2013   Encounter for routine gynecological examination 06/30/2012   Atypical chest pain 04/05/2012   GERD (gastroesophageal reflux disease) 12/30/2011   Other dysphagia 12/30/2011   Thyroid cyst 12/01/2011   Chronic cough 10/31/2011   General medical examination 05/27/2011   OSA (obstructive sleep apnea) 08/30/2010   HIP PAIN, LEFT 03/13/2010   Morbid obesity (HCC) 10/17/2009   ALLERGIC RHINITIS 09/19/2009   Asthma 09/19/2009    Past Surgical History:  Procedure Laterality Date   ABDOMINAL HYSTERECTOMY     CESAREAN SECTION  4/04   ESOPHAGOGASTRODUODENOSCOPY  12/30/2011   Procedure: ESOPHAGOGASTRODUODENOSCOPY (EGD);  Surgeon: [name] Dare, MD,FACG;  Location: [facility];  Service: Endoscopy;  Laterality: N/A;   EYE SURGERY  11/2010   lasar repair of retinal tear   HERNIA REPAIR  04/2009   abdominal hernia   LAPAROSCOPIC TOTAL HYSTERECTOMY  03/17/13   LEFT HEART CATHETERIZATION WITH CORONARY ANGIOGRAM N/A 03/07/2014   Procedure: LEFT HEART CATHETERIZATION WITH CORONARY ANGIOGRAM;  Surgeon: Micheline Chapman, MD;  Location: [facility] CATH LAB;  Service: Cardiovascular;  Laterality: N/A;   TUBAL  LIGATION     WISDOM TOOTH EXTRACTION  1999     OB History   No obstetric history on file.     Family History  Problem Relation Age of Onset   Heart disease Maternal Grandmother    Hypertension Maternal Grandmother    Bipolar disorder Maternal Grandmother    Hypertension Maternal Grandfather    Hypertension Mother    Diabetes type II Mother    Other Mother        cervical dysplasia   Ovarian cancer Mother    Hypertension Father    Bipolar disorder Father    Hypertension Paternal Grandmother    Hypertension Paternal  Grandfather    Hypertension Sister    Heart attack Neg Hx    Hyperlipidemia Neg Hx    Sudden death Neg Hx    Breast cancer Neg Hx     Social History   Tobacco Use   Smoking status: Never   Smokeless tobacco: Never  Substance Use Topics   Alcohol use: No    Alcohol/week: 0.0 standard drinks   Drug use: No    Home Medications Prior to Admission medications   Medication Sig Start Date End Date Taking? Authorizing Provider  ondansetron (ZOFRAN ODT) 4 MG disintegrating tablet Take 1 tablet (4 mg total) by mouth every 8 (eight) hours as needed for nausea or vomiting. 12/23/20  Yes Gloris Manchester, MD  albuterol (PROAIR HFA) 108 (90 BASE) MCG/ACT inhaler Inhale 2 puffs into the lungs every 4 (four) hours as needed for wheezing or shortness of breath. 01/14/13   Sandford Craze, NP  albuterol (PROVENTIL) (2.5 MG/3ML) 0.083% nebulizer solution Take 3 mLs (2.5 mg total) by nebulization every 4 (four) hours as needed. For shortness of breath or wheezing 01/14/13   Sandford Craze, NP  cetirizine (ZYRTEC) 10 MG tablet Take 10 mg by mouth daily.    [provider]  hydrochlorothiazide (MICROZIDE) 12.5 MG capsule Take 1 capsule (12.5 mg total) by mouth daily. Patient not taking: Reported on 10/03/2014 03/08/14   Creig Hines, NP  naproxen (NAPROSYN) 500 MG tablet Take 1 tablet (500 mg total) by mouth 2 (two) times daily. 07/02/19   Wieters, Hallie C, PA-C  omeprazole (PRILOSEC OTC) 20 MG tablet Take 1 tablet (20 mg total) by mouth daily. Patient not taking: Reported on 09/20/2014 03/08/14   Creig Hines, NP  tiotropium [facility]) 18 MCG inhalation capsule Place 18 mcg into inhaler and inhale daily.    [provider]  Vitamin D, Ergocalciferol, (DRISDOL) 50000 UNITS CAPS capsule Take 1 capsule (50,000 Units total) by mouth every 7 (seven) days. Patient not taking: Reported on 08/29/2017 10/06/14   Sheliah Hatch, MD    Allergies    Amlodipine,  Peppermint oil, Fluticasone-salmeterol, Latex, and Spinach  Review of Systems   Review of Systems  Constitutional:  Negative for chills, diaphoresis and fever.  HENT:  Negative for ear pain and sore throat.   Eyes:  Negative for pain and visual disturbance.  Respiratory:  Positive for chest tightness and shortness of breath. Negative for cough, wheezing and stridor.   Cardiovascular:  Negative for chest pain, palpitations and leg swelling.  Gastrointestinal:  Positive for nausea and vomiting. Negative for abdominal pain.  Genitourinary:  Negative for dysuria and hematuria.  Musculoskeletal:  Negative for arthralgias, back pain and neck pain.  Skin:  Negative for color change and rash.  Neurological:  Negative for dizziness, seizures, syncope, weakness, light-headedness, numbness and headaches.  All other  systems reviewed and are negative.  Physical Exam Updated Vital Signs BP (!) 142/89   Pulse 84   Temp 98.2 F (36.8 C) (Oral)   Resp 18   Ht 5\' 7"  (1.702 m)   Wt (!) 168.7 kg   LMP 06/27/2012   SpO2 98%   BMI 58.26 kg/m   Physical Exam Vitals and nursing note reviewed.  Constitutional:      General: She is not in acute distress.    Appearance: She is well-developed. She is not ill-appearing, toxic-appearing or diaphoretic.  HENT:     Head: Normocephalic and atraumatic.     Mouth/Throat:     Mouth: Mucous membranes are moist.  Eyes:     Conjunctiva/sclera: Conjunctivae normal.  Cardiovascular:     Rate and Rhythm: Regular rhythm. Tachycardia present.     Heart sounds: No murmur heard. Pulmonary:     Effort: Pulmonary effort is normal. No tachypnea or respiratory distress.     Breath sounds: Normal breath sounds. No decreased breath sounds, wheezing, rhonchi or rales.  Abdominal:     Palpations: Abdomen is soft.     Tenderness: There is no abdominal tenderness.  Musculoskeletal:     Cervical back: Neck supple.     Right lower leg: No edema.     Left lower leg: No  edema.  Skin:    General: Skin is warm and dry.  Neurological:     General: No focal deficit present.     Mental Status: She is alert.  Psychiatric:        Mood and Affect: Mood normal.        Behavior: Behavior normal.    ED Results / Procedures / Treatments   Labs (all labs ordered are listed, but only abnormal results are displayed) Labs Reviewed  COMPREHENSIVE METABOLIC PANEL - Abnormal; Notable for the following components:      Result Value   Glucose, Bld 213 (*)    AST 8 (*)    All other components within normal limits  CBC - Abnormal; Notable for the following components:   WBC 12.7 (*)    All other components within normal limits  URINALYSIS, ROUTINE W REFLEX MICROSCOPIC - Abnormal; Notable for the following components:   Specific Gravity, Urine 1.035 (*)    Glucose, UA >1,000 (*)    Bacteria, UA RARE (*)    All other components within normal limits  SARS CORONAVIRUS 2 (TAT 6-24 HRS)  LIPASE, BLOOD  PREGNANCY, URINE  TROPONIN I (HIGH SENSITIVITY)  TROPONIN I (HIGH SENSITIVITY)    EKG EKG Interpretation  Date/Time:  Sunday December 23 2020 13:48:18 EDT Ventricular Rate:  91 PR Interval:  186 QRS Duration: 80 QT Interval:  336 QTC Calculation: 413 R Axis:   23 Text Interpretation: Normal sinus rhythm Septal infarct , age undetermined T wave abnormality, consider inferolateral ischemia Abnormal ECG Confirmed by Gloris Manchesterixon, Nyrah Demos [phone]) on 12/23/2020 5:11:35 PM  Radiology CT Angio Chest PE W and/or Wo Contrast  Result Date: 12/23/2020 CLINICAL DATA:  Shortness of breath and vomiting with history of prior COVID-19 infection EXAM: CT ANGIOGRAPHY CHEST WITH CONTRAST TECHNIQUE: Multidetector CT imaging of the chest was performed using the standard protocol during bolus administration of intravenous contrast. Multiplanar CT image reconstructions and MIPs were obtained to evaluate the vascular anatomy. CONTRAST:  100mL OMNIPAQUE IOHEXOL 350 MG/ML SOLN COMPARISON:  Chest x-ray  from earlier in the same day. FINDINGS: Cardiovascular: Thoracic aorta and its branches are well visualize without aneurysmal dilatation  or dissection. No cardiac enlargement is seen. No pericardial effusion is noted. No significant coronary calcifications are noted. The pulmonary artery is well visualized within normal branching pattern. The degree of opacification however is less than optimal peripherally. Some artifact is also noted related to the patient's body habitus. No discrete filling defect is identified to suggest pulmonary embolism. Mediastinum/Nodes: Thoracic inlet is within normal limits. No sizable hilar or mediastinal adenopathy is noted. The esophagus as visualized is within normal limits. Lungs/Pleura: The lungs are well aerated bilaterally. No focal infiltrate or sizable effusion is noted. No sizable parenchymal nodule is seen. Upper Abdomen: Visualized upper abdomen is within normal limits. Musculoskeletal: Mild degenerative changes of the thoracic spine are noted. Review of the MIP images confirms the above findings. IMPRESSION: No discrete pulmonary emboli are identified. No acute abnormality noted. Electronically Signed   By: Alcide Clever M.D.   On: 12/23/2020 18:26   DG Chest Portable 1 View  Result Date: 12/23/2020 CLINICAL DATA:  45 year old female with shortness of breath EXAM: PORTABLE CHEST 1 VIEW COMPARISON:  10/25/2020 FINDINGS: Cardiomediastinal silhouette unchanged in size and contour. No evidence of central vascular congestion. No interlobular septal thickening. No pneumothorax or pleural effusion. No confluent airspace disease. No acute displaced fracture IMPRESSION: No active disease. Electronically Signed   By: Gilmer Mor D.O.   On: 12/23/2020 14:47    Procedures Procedures   Medications Ordered in ED Medications  lactated ringers bolus 1,000 mL (0 mLs Intravenous Stopped 12/23/20 1930)  ondansetron (ZOFRAN) injection 4 mg (4 mg Intravenous Given 12/23/20 1738)   iohexol (OMNIPAQUE) 350 MG/ML injection 100 mL (100 mLs Intravenous Contrast Given 12/23/20 1759)    ED Course  I have reviewed the triage vital signs and the nursing notes.  Pertinent labs & imaging results that were available during my care of the patient were reviewed by me and considered in my medical decision making (see chart for details).    MDM Rules/Calculators/A&P                           Patient is a 45 year old female who presents for chest tightness, shortness of breath, and vomiting since 3 AM this morning.  Home albuterol treatments did not provide any relief.  On arrival, vital signs notable for hypertension.  She appears fatigued and uncomfortable.  Her breathing is not significantly labored.  No wheezes appreciated on lung auscultation.  SPO2 is normal on room air.  Patient does have recent COVID diagnosis 10 days ago.  She reports that she only had mild viral symptoms.  However, given her recent COVID, she is at risk for ACS and VTE.  She had reported left-sided swelling to her leg that is since resolved.  Pending bedded in the ED, basic labs were obtained.  EKG did not show any obvious STEMI.  Will obtain troponins and CTA of chest to identify possible life-threatening etiologies.  Given her persistent vomiting throughout the day, bolus of IV fluids was ordered.  Zofran was ordered for symptomatic relief of nausea.  Patient's work-up notable for leukocytosis, likely attributable to persistent vomiting.  Troponins were normal.  CTA of chest did not show any clear evidence of PE or any other acute pulmonary process.  On reassessment, patient reports improvement in nausea.  She was able to tolerate p.o. intake.  She did continue to endorse some chest tightness, which may be attributable to progression of COVID-19 symptoms.  Given her reassuring vital  signs, patient will not require hospitalization at this time and can continue supportive care at home.  Per her request, repeat COVID-19  testing was ordered.  Patient was discharged in stable condition.  Final Clinical Impression(s) / ED Diagnoses Final diagnoses:  Non-intractable vomiting with nausea, unspecified vomiting type    Rx / DC Orders ED Discharge Orders          Ordered    ondansetron (ZOFRAN ODT) 4 MG disintegrating tablet  Every 8 hours PRN        12/23/20 2004             Gloris Manchester, MD 12/24/20 [phone]

## 2020-12-24 LAB — SARS CORONAVIRUS 2 (TAT 6-24 HRS): SARS Coronavirus 2: POSITIVE — AB

## 2021-04-23 ENCOUNTER — Other Ambulatory Visit (HOSPITAL_COMMUNITY): Payer: Self-pay

## 2021-04-23 MED ORDER — TRULICITY 3 MG/0.5ML ~~LOC~~ SOAJ
3.0000 mg | SUBCUTANEOUS | 12 refills | Status: AC
  Filled 2021-04-23: qty 2, 28d supply, fill #0
  Filled 2021-05-10: qty 2, 28d supply, fill #1
  Filled 2021-06-24: qty 2, 28d supply, fill #0
  Filled 2022-03-24 – 2022-04-04 (×2): qty 2, 28d supply, fill #1

## 2021-05-10 ENCOUNTER — Other Ambulatory Visit (HOSPITAL_COMMUNITY): Payer: Self-pay

## 2021-05-13 ENCOUNTER — Other Ambulatory Visit (HOSPITAL_COMMUNITY): Payer: Self-pay

## 2021-06-24 ENCOUNTER — Other Ambulatory Visit (HOSPITAL_BASED_OUTPATIENT_CLINIC_OR_DEPARTMENT_OTHER): Payer: Self-pay

## 2021-07-10 ENCOUNTER — Other Ambulatory Visit (HOSPITAL_BASED_OUTPATIENT_CLINIC_OR_DEPARTMENT_OTHER): Payer: Self-pay

## 2021-07-10 MED ORDER — TRULICITY 3 MG/0.5ML ~~LOC~~ SOAJ
3.0000 mg | SUBCUTANEOUS | 3 refills | Status: DC
  Filled 2021-07-10: qty 6, 84d supply, fill #0
  Filled 2021-07-15: qty 2, 28d supply, fill #0
  Filled 2021-08-12: qty 2, 28d supply, fill #1
  Filled 2021-09-16: qty 2, 28d supply, fill #2
  Filled 2021-10-11: qty 2, 28d supply, fill #3
  Filled 2021-11-01 – 2021-11-04 (×2): qty 2, 28d supply, fill #4
  Filled 2021-12-03: qty 2, 28d supply, fill #5
  Filled 2022-01-03: qty 6, 84d supply, fill #6
  Filled 2022-04-28: qty 2, 28d supply, fill #0
  Filled 2022-05-30: qty 2, 28d supply, fill #1

## 2021-07-15 ENCOUNTER — Other Ambulatory Visit (HOSPITAL_BASED_OUTPATIENT_CLINIC_OR_DEPARTMENT_OTHER): Payer: Self-pay

## 2021-08-12 ENCOUNTER — Other Ambulatory Visit (HOSPITAL_BASED_OUTPATIENT_CLINIC_OR_DEPARTMENT_OTHER): Payer: Self-pay

## 2021-08-20 ENCOUNTER — Other Ambulatory Visit: Payer: Self-pay | Admitting: Orthopedic Surgery

## 2021-08-20 DIAGNOSIS — M5416 Radiculopathy, lumbar region: Secondary | ICD-10-CM

## 2021-08-25 ENCOUNTER — Ambulatory Visit
Admission: RE | Admit: 2021-08-25 | Discharge: 2021-08-25 | Disposition: A | Discharge status: Home / Self Care | Payer: BC Managed Care – PPO | Source: Ambulatory Visit | Attending: Orthopedic Surgery | Admitting: Orthopedic Surgery

## 2021-08-25 DIAGNOSIS — M5416 Radiculopathy, lumbar region: Secondary | ICD-10-CM

## 2021-09-16 ENCOUNTER — Other Ambulatory Visit (HOSPITAL_BASED_OUTPATIENT_CLINIC_OR_DEPARTMENT_OTHER): Payer: Self-pay

## 2021-10-11 ENCOUNTER — Other Ambulatory Visit (HOSPITAL_BASED_OUTPATIENT_CLINIC_OR_DEPARTMENT_OTHER): Payer: Self-pay

## 2021-10-16 ENCOUNTER — Other Ambulatory Visit (HOSPITAL_BASED_OUTPATIENT_CLINIC_OR_DEPARTMENT_OTHER): Payer: Self-pay

## 2021-11-01 ENCOUNTER — Other Ambulatory Visit (HOSPITAL_BASED_OUTPATIENT_CLINIC_OR_DEPARTMENT_OTHER): Payer: Self-pay

## 2021-11-04 ENCOUNTER — Other Ambulatory Visit (HOSPITAL_BASED_OUTPATIENT_CLINIC_OR_DEPARTMENT_OTHER): Payer: Self-pay

## 2021-12-03 ENCOUNTER — Other Ambulatory Visit (HOSPITAL_BASED_OUTPATIENT_CLINIC_OR_DEPARTMENT_OTHER): Payer: Self-pay

## 2021-12-08 ENCOUNTER — Other Ambulatory Visit: Payer: Self-pay

## 2021-12-08 ENCOUNTER — Encounter (HOSPITAL_COMMUNITY): Payer: Self-pay

## 2021-12-08 ENCOUNTER — Emergency Department (HOSPITAL_COMMUNITY): Payer: BC Managed Care – PPO

## 2021-12-08 ENCOUNTER — Emergency Department (HOSPITAL_COMMUNITY)
Admission: EM | Admit: 2021-12-08 | Discharge: 2021-12-08 | Disposition: A | Discharge status: Home / Self Care | Payer: BC Managed Care – PPO | Attending: Emergency Medicine | Admitting: Emergency Medicine

## 2021-12-08 DIAGNOSIS — E119 Type 2 diabetes mellitus without complications: Secondary | ICD-10-CM | POA: Insufficient documentation

## 2021-12-08 DIAGNOSIS — D72829 Elevated white blood cell count, unspecified: Secondary | ICD-10-CM | POA: Diagnosis not present

## 2021-12-08 DIAGNOSIS — R079 Chest pain, unspecified: Secondary | ICD-10-CM | POA: Diagnosis present

## 2021-12-08 DIAGNOSIS — Z79899 Other long term (current) drug therapy: Secondary | ICD-10-CM | POA: Insufficient documentation

## 2021-12-08 DIAGNOSIS — R072 Precordial pain: Secondary | ICD-10-CM | POA: Diagnosis not present

## 2021-12-08 DIAGNOSIS — Z7951 Long term (current) use of inhaled steroids: Secondary | ICD-10-CM | POA: Insufficient documentation

## 2021-12-08 DIAGNOSIS — J45909 Unspecified asthma, uncomplicated: Secondary | ICD-10-CM | POA: Insufficient documentation

## 2021-12-08 DIAGNOSIS — R1110 Vomiting, unspecified: Secondary | ICD-10-CM | POA: Diagnosis not present

## 2021-12-08 DIAGNOSIS — R0602 Shortness of breath: Secondary | ICD-10-CM | POA: Diagnosis not present

## 2021-12-08 DIAGNOSIS — I10 Essential (primary) hypertension: Secondary | ICD-10-CM | POA: Diagnosis not present

## 2021-12-08 DIAGNOSIS — Z91040 Latex allergy status: Secondary | ICD-10-CM | POA: Diagnosis not present

## 2021-12-08 LAB — BASIC METABOLIC PANEL
Anion gap: 6 (ref 5–15)
BUN: 19 mg/dL (ref 6–20)
CO2: 24 mmol/L (ref 22–32)
Calcium: 9.4 mg/dL (ref 8.9–10.3)
Chloride: 105 mmol/L (ref 98–111)
Creatinine, Ser: 0.79 mg/dL (ref 0.44–1.00)
GFR, Estimated: >60 mL/min (ref >60)
Glucose, Bld: 219 mg/dL — ABNORMAL HIGH (ref 70–99)
Potassium: 4 mmol/L (ref 3.5–5.1)
Sodium: 135 mmol/L (ref 135–145)

## 2021-12-08 LAB — I-STAT BETA HCG BLOOD, ED (MC, WL, AP ONLY): I-stat hCG, quantitative: <5 m[IU]/mL (ref <5)

## 2021-12-08 LAB — CBC
HCT: 44.1 % (ref 36.0–46.0)
Hemoglobin: 14.6 g/dL (ref 12.0–15.0)
MCH: 28.7 pg (ref 26.0–34.0)
MCHC: 33.1 g/dL (ref 30.0–36.0)
MCV: 86.6 fL (ref 80.0–100.0)
Platelets: 375 10*3/uL (ref 150–400)
RBC: 5.09 MIL/uL (ref 3.87–5.11)
RDW: 13.5 % (ref 11.5–15.5)
WBC: 11.2 10*3/uL — ABNORMAL HIGH (ref 4.0–10.5)
nRBC: 0 % (ref 0.0–0.2)

## 2021-12-08 LAB — D-DIMER, QUANTITATIVE: D-Dimer, Quant: <0.27 ug/mL-FEU (ref 0.00–0.50)

## 2021-12-08 LAB — TROPONIN I (HIGH SENSITIVITY)
Troponin I (High Sensitivity): 4 ng/L (ref <18)
Troponin I (High Sensitivity): 4 ng/L (ref <18)

## 2021-12-08 MED ORDER — ALUM & MAG HYDROXIDE-SIMETH 200-200-20 MG/5ML PO SUSP
30.0000 mL | Freq: Once | ORAL | Status: AC
  Administered 2021-12-08: 30 mL via ORAL
  Filled 2021-12-08: qty 30

## 2021-12-08 NOTE — ED Provider Notes (Signed)
  Physical Exam  BP 128/81   Pulse 92   Temp 98.4 F (36.9 C) (Oral)   Resp 16   LMP 06/27/2012   SpO2 96%   Physical Exam  Procedures  Procedures  ED Course / MDM   Clinical Course as of 12/08/21 1528  Sun Dec 08, 2021  1526 D/c if d dimer and 2nd trop neg. No clinical indication for pneumonia [CR]    Clinical Course User Index [CR] Peter Garter, PA   Medical Decision Making Amount and/or Complexity of Data Reviewed Labs: ordered. Radiology: ordered.  Risk OTC drugs.   Patient care handed off from [facility], PA-C at shift change; see prior note for full HPI/PE.  Treatment plan was to follow-up on D-dimer and second troponin and expectation of discharge if negative.  No clinical suspicion for pneumonia despite chest x-ray findings with bilateral mild atelectasis.  In short, patient presents emergency department for squeezing chest pain is left-sided in nature with radiation to her left neck.  She describes it as stabbing/squeezing type pain.  She had 1 episode of vomiting at the time of onset.  She took albuterol inhaler which partially relieved her symptoms but persistence brought her to the emergency department today.  She has had similar symptoms in the past which she reports the same but slightly more intense than usual. Past medical history significant for hypertension, unstable angina, obesity, OSA, diabetes, asthma  Laboratory studies significant for no electrolyte abnormality.  Mild leukocytosis of 11.2 with no left shift.  Pregnancy negative.  Initial troponin of 4 with delta negative.  D-dimer less than 0.27 so need for CT angiography of chest to evaluate for PE deemed unnecessary at this time.  Imaging studies significant for no defined bilateral lower lobe airspace disease.  Clinical suspicion low for pneumonia given patient's presenting symptoms doubt infectious source.  EKG significant for T wave inversion in inferior leads of 1 and 2 with mild ST  elevation of V1, V2.  Comparable in appearance when compared to EKG from 6 months ago on 12/23/2020.  Sinus rhythm noted.  Patient had prior nuclear stress test from 10/22 which showed no signs of reversible ischemia/infarction left ventricular ejection fraction of 70%.  Echocardiogram from 10/20 showed similarly and left ventricular ejection fraction of 60 to 65% with normal wall motion.  No significant valvular disease.  Chest CTA from August 2022 showed no pulmonary embolism.  Patient has planned cardiac echo, Holter monitor, Lexiscan nuclear stress test, chest x-ray, laboratory studies planned for tomorrow at [facility] through cardiologist Wille Glaser MD.   Given delta negative troponin, lack of EKG findings, ACS deemed unlikely at this time.  Doubt PE given negative D-dimer.  Doubt pneumonia.  Doubt pneumothorax.  Doubt dissection.  Doubt pericarditis/myocarditis.  Given patient close cardiology follow-up tomorrow with thorough laboratory as well as imaging studies scheduled, patient deemed safe for discharge.  Treatment plan was discussed with patient and patient was understanding and was agreeable.  Worrisome signs symptoms were discussed with the patient the patient knowledge understanding to return to emergency department if noticed.  Patient was stable upon discharge.   Peter Garter, [place] 12/08/21 1842    Lonell Grandchild, MD 12/09/21 [phone]

## 2021-12-08 NOTE — ED Notes (Signed)
The pt and family are eating fried chicken currently

## 2021-12-08 NOTE — Discharge Instructions (Addendum)
Please read and follow all provided instructions.  Your diagnoses today include:  1. Precordial pain     Tests performed today include: An EKG of your heart: unchanged from previous A chest x-ray: no concerning findings Cardiac enzymes - Blood tests for heart muscle damage D-dimer: screening test for blood clot Blood counts and electrolytes: No concerning findings Vital signs. See below for your results today.   Medications prescribed:  None  Take any prescribed medications only as directed.  Follow-up instructions: Please follow-up with your cardiologist as planned for further evaluation of your symptoms.   Return instructions:  SEEK IMMEDIATE MEDICAL ATTENTION IF: You have severe chest pain, especially if the pain is crushing or pressure-like and spreads to the arms, back, neck, or jaw, or if you have sweating, nausea or vomiting, or trouble with breathing. THIS IS AN EMERGENCY. Do not wait to see if the pain will go away. Get medical help at once. Call 911. DO NOT drive yourself to the hospital.  Your chest pain gets worse and does not go away after a few minutes of rest.  You have an attack of chest pain lasting longer than what you usually experience.  You have significant dizziness, if you pass out, or have trouble walking.  You have chest pain not typical of your usual pain for which you originally saw your caregiver.  You have any other emergent concerns regarding your health.  Additional Information: Chest pain comes from many different causes. Your caregiver has diagnosed you as having chest pain that is not specific for one problem, but does not require admission.  You are at low risk for an acute heart condition or other serious illness.   Your vital signs today were: BP 128/81   Pulse 92   Temp 98.4 F (36.9 C) (Oral)   Resp 16   LMP 06/27/2012   SpO2 96%  If your blood pressure (BP) was elevated above 135/85 this visit, please have this repeated by your doctor  within one month. --------------

## 2021-12-08 NOTE — ED Provider Notes (Signed)
[facility] EMERGENCY DEPARTMENT Provider Note   CSN: [id number] Arrival date & time: 12/08/21  1043     History  No chief complaint on file.   Amber Salas is a 46 y.o. female.  Patient with history of asthma, diabetes, hypertension --presents to the emergency department today for evaluation of chest pain and shortness of breath.  Patient states that she awoke around 3 AM with a squeezing pain in her left chest that radiates to her left neck.  She states that she feels like she is being stabbed.  She had an episode of vomiting around the time of onset.  She states that she took her albuterol inhaler which allowed her to rest.  Symptoms persisted this morning, prompting emergency department visit.  No fevers or cough.  No associated diaphoresis.  Patient denies risk factors for pulmonary embolism including: unilateral leg swelling, history of DVT/PE/other blood clots, use of exogenous hormones, recent immobilizations, recent surgery, recent travel (>4hr segment), malignancy, hemoptysis.  States that symptoms are unchanged by eating or drinking.  She has had similar symptoms in the past however the squeezing is more intense and typical.   She has recently seen cardiology for elevated heart rate.  She states that she has plans to have a monitor placed tomorrow.  States she was prescribed metoprolol but has not started taking it yet.   Per recent cardiology note in [facility] system: Labs 12/03/21:  BNP normal Troponin T <6 CMP blood sugar 140 otherwise unremarkable D-dimer 0.41  Cardiac echo from Oct, 2020 revealed  normal LV systolic function with EF 60 to 65%,  normal LV wall motion,  Moderate concentric LVH, indeterminate LV diastolic function, no significant valvular disease.  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 No reversible ischemia or infarction.  2. Normal left ventricular wall motion.  3. Left ventricular ejection fraction 70%   4. Non invasive risk stratification: Low   Patient's father had enlarged heart and he died at age early 7s. Patient's grandmother some heart disease as per patient and she died at age 77. Also reports younger brother died of cardiac arrest, told it was COVID related.       Home Medications Prior to Admission medications   Medication Sig Start Date End Date Taking? Authorizing Provider  albuterol (PROAIR HFA) 108 (90 BASE) MCG/ACT inhaler Inhale 2 puffs into the lungs every 4 (four) hours as needed for wheezing or shortness of breath. 01/14/13   Sandford Craze, NP  albuterol (PROVENTIL) (2.5 MG/3ML) 0.083% nebulizer solution Take 3 mLs (2.5 mg total) by nebulization every 4 (four) hours as needed. For shortness of breath or wheezing 01/14/13   Sandford Craze, NP  cetirizine (ZYRTEC) 10 MG tablet Take 10 mg by mouth daily.    [provider]  Dulaglutide (TRULICITY) 3 MG/0.5ML SOPN Inject 3 mg into the skin once a week. 04/11/21     Dulaglutide (TRULICITY) 3 MG/0.5ML SOPN Inject 3 mg (0.5 ml) into the skin once a week at 9am. 06/25/21   McClanahan, Bella Kennedy, NP  hydrochlorothiazide (MICROZIDE) 12.5 MG capsule Take 1 capsule (12.5 mg total) by mouth daily. Patient not taking: Reported on 10/03/2014 03/08/14   Creig Hines, NP  naproxen (NAPROSYN) 500 MG tablet Take 1 tablet (500 mg total) by mouth 2 (two) times daily. 07/02/19   Wieters, Hallie C, PA-C  omeprazole (PRILOSEC OTC) 20 MG tablet Take 1 tablet (20 mg total) by mouth daily. Patient not taking: Reported on 09/20/2014  03/08/14   Creig Hines, NP  ondansetron (ZOFRAN ODT) 4 MG disintegrating tablet Take 1 tablet (4 mg total) by mouth every 8 (eight) hours as needed for nausea or vomiting. 12/23/20   Gloris Manchester, MD  tiotropium (SPIRIVA) 18 MCG inhalation capsule Place 18 mcg into inhaler and inhale daily.    [provider]  Vitamin D, Ergocalciferol, (DRISDOL) 50000 UNITS CAPS capsule Take 1  capsule (50,000 Units total) by mouth every 7 (seven) days. Patient not taking: Reported on 08/29/2017 10/06/14   Sheliah Hatch, MD      Allergies    Amlodipine, Peppermint oil, Fluticasone-salmeterol, Latex, and Spinach    Review of Systems   Review of Systems  Physical Exam Updated Vital Signs BP (!) 145/104 (BP Location: Right Wrist)   Pulse 95   Temp 98.4 F (36.9 C) (Oral)   Resp 17   LMP 06/27/2012   SpO2 97%   Physical Exam Vitals and nursing note reviewed.  Constitutional:      Appearance: She is well-developed. She is not diaphoretic.  HENT:     Head: Normocephalic and atraumatic.     Mouth/Throat:     Mouth: Mucous membranes are not dry.  Eyes:     Conjunctiva/sclera: Conjunctivae normal.  Neck:     Vascular: Normal carotid pulses. No JVD.     Trachea: Trachea normal. No tracheal deviation.  Cardiovascular:     Rate and Rhythm: Normal rate and regular rhythm.     Pulses: No decreased pulses.          Radial pulses are 2+ on the right side and 2+ on the left side.     Heart sounds: Normal heart sounds, S1 normal and S2 normal. No murmur heard. Pulmonary:     Effort: Pulmonary effort is normal. No respiratory distress.     Breath sounds: No wheezing.  Chest:     Chest wall: No tenderness.  Abdominal:     General: Bowel sounds are normal.     Palpations: Abdomen is soft.     Tenderness: There is no abdominal tenderness. There is no guarding or rebound.  Musculoskeletal:        General: Normal range of motion.     Cervical back: Normal range of motion and neck supple. No muscular tenderness.  Skin:    General: Skin is warm and dry.     Coloration: Skin is not pale.  Neurological:     Mental Status: She is alert.     ED Results / Procedures / Treatments   Labs (all labs ordered are listed, but only abnormal results are displayed) Labs Reviewed  BASIC METABOLIC PANEL - Abnormal; Notable for the following components:      Result Value   Glucose,  Bld 219 (*)    All other components within normal limits  CBC - Abnormal; Notable for the following components:   WBC 11.2 (*)    All other components within normal limits  D-DIMER, QUANTITATIVE  I-STAT BETA HCG BLOOD, ED ([facility], [facility], [facility] ONLY)  TROPONIN I (HIGH SENSITIVITY)  TROPONIN I (HIGH SENSITIVITY)    EKG EKG Interpretation  Date/Time:  Sunday December 08 2021 10:55:57 EDT Ventricular Rate:  95 PR Interval:  196 QRS Duration: 74 QT Interval:  346 QTC Calculation: 434 R Axis:   20 Text Interpretation: Normal sinus rhythm When compared with ECG of 23-Dec-2020 13:48, PREVIOUS ECG IS PRESENT Confirmed by Virgina Norfolk ([id number]) on 12/08/2021 12:56:04 PM  Radiology DG  Chest 2 View  Result Date: 12/08/2021 CLINICAL DATA:  Shortness of breath. Chest pain. Dizziness. EXAM: CHEST - 2 VIEW COMPARISON:  None Available. FINDINGS: Heart size is normal. Lung volumes are low. Ill-defined airspace opacities are present over the lower lobes bilaterally. No edema or effusion is present. The visualized soft tissues and bony thorax are unremarkable. IMPRESSION: Ill-defined bilateral lower lobe airspace disease. While this may represent atelectasis, early infection is not excluded. Electronically Signed   By: Marin Roberts M.D.   On: 12/08/2021 11:34    Procedures Procedures    Medications Ordered in ED Medications  alum & mag hydroxide-simeth (MAALOX/MYLANTA) 200-200-20 MG/5ML suspension 30 mL (has no administration in time range)    ED Course/ Medical Decision Making/ A&P    Patient seen and examined. History obtained directly from patient. Work-up including labs, imaging, EKG ordered in triage, if performed, were reviewed.    Labs/EKG: Independently reviewed and interpreted.  This included: CBC with mildly elevated white blood cell count 11.2; BMP with glucose 219 otherwise unremarkable; first troponin 4; pregnancy negative.  EKG normal sinus rhythm, heart rate 95, with T wave abnormality,  appears improved though from 12/2020.   Imaging: Independently visualized and interpreted.  This included: Chest x-ray, no focal infiltrates noted.  Given clinical picture, suspect that bilateral lower lobe airspace disease noted on radiology read, more likely atelectasis.  Medications/Fluids: None ordered  Most recent vital signs reviewed and are as follows: BP 128/81   Pulse 92   Temp 98.4 F (36.9 C) (Oral)   Resp 16   LMP 06/27/2012   SpO2 96%   Initial impression: Precordial pain, patient with risk factors  3:16 PM Reassessment performed. Patient appears stable.  Labs personally reviewed and interpreted including: Continuing to await second troponin and D-dimer  Reviewed pertinent lab work and imaging with patient at bedside. Questions answered.   Most current vital signs reviewed and are as follows: BP 128/81   Pulse 92   Temp 98.4 F (36.9 C) (Oral)   Resp 16   LMP 06/27/2012   SpO2 96%   Plan: Discharge to home if remainder of work-up is reassuring.  Signout to [employer] at shift change.                             Medical Decision Making Amount and/or Complexity of Data Reviewed Labs: ordered. Radiology: ordered.   For this patient's complaint of chest pain, the following emergent conditions were considered on the differential diagnosis: acute coronary syndrome, pulmonary embolism, pneumothorax, myocarditis, pericardial tamponade, aortic dissection, thoracic aortic aneurysm complication, esophageal perforation.   Other causes were also considered including: gastroesophageal reflux disease, musculoskeletal pain including costochondritis, pneumonia/pleurisy, herpes zoster, pericarditis.  In regards to possibility of ACS, patient has atypical features of pain, unchanged EKG and negative troponin. Heart score was calculated to be 3.   In regards to possibility of PE, d-dimer pending. Recent d-dimer neg but symptoms worsening so rechecking.            Final Clinical Impression(s) / ED Diagnoses Final diagnoses:  Precordial pain    Rx / DC Orders ED Discharge Orders     None         Renne Crigler, PA-C 12/08/21 1520    Virgina Norfolk, DO 12/08/21 1524

## 2021-12-08 NOTE — ED Triage Notes (Signed)
Patient complains of ongoing chest pain and early am worsening CP with sob and nausea/vomiting. Patient alert and oriented, states that she is have heart monitor placed tomorrow at cardiology

## 2022-01-03 ENCOUNTER — Other Ambulatory Visit (HOSPITAL_BASED_OUTPATIENT_CLINIC_OR_DEPARTMENT_OTHER): Payer: Self-pay

## 2022-03-24 ENCOUNTER — Other Ambulatory Visit (HOSPITAL_BASED_OUTPATIENT_CLINIC_OR_DEPARTMENT_OTHER): Payer: Self-pay

## 2022-03-29 ENCOUNTER — Other Ambulatory Visit (HOSPITAL_COMMUNITY): Payer: Self-pay

## 2022-04-01 ENCOUNTER — Other Ambulatory Visit (HOSPITAL_BASED_OUTPATIENT_CLINIC_OR_DEPARTMENT_OTHER): Payer: Self-pay

## 2022-04-04 ENCOUNTER — Other Ambulatory Visit (HOSPITAL_BASED_OUTPATIENT_CLINIC_OR_DEPARTMENT_OTHER): Payer: Self-pay

## 2022-04-28 ENCOUNTER — Other Ambulatory Visit (HOSPITAL_BASED_OUTPATIENT_CLINIC_OR_DEPARTMENT_OTHER): Payer: Self-pay

## 2022-05-30 ENCOUNTER — Other Ambulatory Visit (HOSPITAL_BASED_OUTPATIENT_CLINIC_OR_DEPARTMENT_OTHER): Payer: Self-pay

## 2022-06-30 ENCOUNTER — Other Ambulatory Visit (HOSPITAL_BASED_OUTPATIENT_CLINIC_OR_DEPARTMENT_OTHER): Payer: Self-pay | Admitting: Adult Health Nurse Practitioner

## 2022-06-30 ENCOUNTER — Other Ambulatory Visit (HOSPITAL_BASED_OUTPATIENT_CLINIC_OR_DEPARTMENT_OTHER): Payer: Self-pay

## 2022-07-01 ENCOUNTER — Other Ambulatory Visit (HOSPITAL_BASED_OUTPATIENT_CLINIC_OR_DEPARTMENT_OTHER): Payer: Self-pay

## 2022-07-01 MED ORDER — TRULICITY 3 MG/0.5ML ~~LOC~~ SOAJ
SUBCUTANEOUS | 3 refills | Status: DC
  Filled 2022-07-01 – 2022-07-08 (×2): qty 6, 84d supply, fill #0
  Filled 2022-09-19: qty 6, 84d supply, fill #1
  Filled 2022-12-01 – 2022-12-02 (×2): qty 6, 84d supply, fill #2
  Filled 2023-03-12: qty 6, 84d supply, fill #3

## 2022-07-07 ENCOUNTER — Other Ambulatory Visit (HOSPITAL_BASED_OUTPATIENT_CLINIC_OR_DEPARTMENT_OTHER): Payer: Self-pay

## 2022-07-07 ENCOUNTER — Encounter (HOSPITAL_BASED_OUTPATIENT_CLINIC_OR_DEPARTMENT_OTHER): Payer: Self-pay

## 2022-07-08 ENCOUNTER — Other Ambulatory Visit (HOSPITAL_BASED_OUTPATIENT_CLINIC_OR_DEPARTMENT_OTHER): Payer: Self-pay

## 2022-09-19 ENCOUNTER — Other Ambulatory Visit (HOSPITAL_BASED_OUTPATIENT_CLINIC_OR_DEPARTMENT_OTHER): Payer: Self-pay

## 2022-11-10 ENCOUNTER — Encounter (HOSPITAL_BASED_OUTPATIENT_CLINIC_OR_DEPARTMENT_OTHER): Payer: Self-pay

## 2022-11-10 ENCOUNTER — Other Ambulatory Visit: Payer: Self-pay

## 2022-11-10 ENCOUNTER — Emergency Department (HOSPITAL_BASED_OUTPATIENT_CLINIC_OR_DEPARTMENT_OTHER): Payer: BC Managed Care – PPO | Admitting: Radiology

## 2022-11-10 ENCOUNTER — Ambulatory Visit
Admission: EM | Admit: 2022-11-10 | Discharge: 2022-11-10 | Disposition: A | Discharge status: Home / Self Care | Payer: BC Managed Care – PPO | Attending: Family Medicine | Admitting: Family Medicine

## 2022-11-10 ENCOUNTER — Emergency Department (HOSPITAL_BASED_OUTPATIENT_CLINIC_OR_DEPARTMENT_OTHER)
Admission: EM | Admit: 2022-11-10 | Discharge: 2022-11-10 | Disposition: A | Discharge status: Home / Self Care | Payer: BC Managed Care – PPO | Attending: Emergency Medicine | Admitting: Emergency Medicine

## 2022-11-10 DIAGNOSIS — R0789 Other chest pain: Secondary | ICD-10-CM | POA: Diagnosis not present

## 2022-11-10 DIAGNOSIS — E1165 Type 2 diabetes mellitus with hyperglycemia: Secondary | ICD-10-CM | POA: Diagnosis not present

## 2022-11-10 DIAGNOSIS — Z20822 Contact with and (suspected) exposure to covid-19: Secondary | ICD-10-CM | POA: Diagnosis not present

## 2022-11-10 DIAGNOSIS — R079 Chest pain, unspecified: Secondary | ICD-10-CM

## 2022-11-10 DIAGNOSIS — R739 Hyperglycemia, unspecified: Secondary | ICD-10-CM

## 2022-11-10 DIAGNOSIS — R519 Headache, unspecified: Secondary | ICD-10-CM

## 2022-11-10 DIAGNOSIS — Z91040 Latex allergy status: Secondary | ICD-10-CM | POA: Insufficient documentation

## 2022-11-10 DIAGNOSIS — E119 Type 2 diabetes mellitus without complications: Secondary | ICD-10-CM

## 2022-11-10 LAB — CBG MONITORING, ED
Glucose-Capillary: 270 mg/dL — ABNORMAL HIGH (ref 70–99)
Glucose-Capillary: 225 mg/dL — ABNORMAL HIGH (ref 70–99)

## 2022-11-10 LAB — URINALYSIS, ROUTINE W REFLEX MICROSCOPIC
Bacteria, UA: NONE SEEN
Bilirubin Urine: NEGATIVE
Glucose, UA: 500 mg/dL — AB
Hgb urine dipstick: NEGATIVE
Ketones, ur: NEGATIVE mg/dL
Leukocytes,Ua: NEGATIVE
Nitrite: NEGATIVE
Protein, ur: NEGATIVE mg/dL
Specific Gravity, Urine: 1.014 (ref 1.005–1.030)
pH: 5 (ref 5.0–8.0)

## 2022-11-10 LAB — BASIC METABOLIC PANEL
Anion gap: 7 (ref 5–15)
BUN: 20 mg/dL (ref 6–20)
CO2: 28 mmol/L (ref 22–32)
Calcium: 9.9 mg/dL (ref 8.9–10.3)
Chloride: 99 mmol/L (ref 98–111)
Creatinine, Ser: 0.73 mg/dL (ref 0.44–1.00)
GFR, Estimated: >60 mL/min (ref >60)
Glucose, Bld: 303 mg/dL — ABNORMAL HIGH (ref 70–99)
Potassium: 4.7 mmol/L (ref 3.5–5.1)
Sodium: 134 mmol/L — ABNORMAL LOW (ref 135–145)

## 2022-11-10 LAB — CBC
HCT: 42.6 % (ref 36.0–46.0)
Hemoglobin: 14 g/dL (ref 12.0–15.0)
MCH: 28.9 pg (ref 26.0–34.0)
MCHC: 32.9 g/dL (ref 30.0–36.0)
MCV: 87.8 fL (ref 80.0–100.0)
Platelets: 334 10*3/uL (ref 150–400)
RBC: 4.85 MIL/uL (ref 3.87–5.11)
RDW: 12.7 % (ref 11.5–15.5)
WBC: 10.1 10*3/uL (ref 4.0–10.5)
nRBC: 0 % (ref 0.0–0.2)

## 2022-11-10 LAB — POCT FASTING CBG KUC MANUAL ENTRY: POCT Glucose (KUC): 309 mg/dL — AB (ref 70–99)

## 2022-11-10 LAB — SARS CORONAVIRUS 2 BY RT PCR: SARS Coronavirus 2 by RT PCR: NEGATIVE

## 2022-11-10 LAB — TROPONIN I (HIGH SENSITIVITY): Troponin I (High Sensitivity): 3 ng/L (ref <18)

## 2022-11-10 MED ORDER — SODIUM CHLORIDE 0.9 % IV BOLUS
1000.0000 mL | Freq: Once | INTRAVENOUS | Status: AC
  Administered 2022-11-10: 1000 mL via INTRAVENOUS

## 2022-11-10 MED ORDER — INSULIN ASPART 100 UNIT/ML IJ SOLN
5.0000 [IU] | Freq: Once | INTRAMUSCULAR | Status: AC
Start: 2022-11-10 — End: 2022-11-10
  Administered 2022-11-10: 5 [IU] via SUBCUTANEOUS

## 2022-11-10 MED ORDER — KETOROLAC TROMETHAMINE 15 MG/ML IJ SOLN
15.0000 mg | Freq: Once | INTRAMUSCULAR | Status: AC
  Administered 2022-11-10: 15 mg via INTRAVENOUS
  Filled 2022-11-10: qty 1

## 2022-11-10 MED ORDER — METOCLOPRAMIDE HCL 5 MG/ML IJ SOLN
10.0000 mg | Freq: Once | INTRAMUSCULAR | Status: AC
  Administered 2022-11-10: 10 mg via INTRAVENOUS
  Filled 2022-11-10: qty 2

## 2022-11-10 NOTE — ED Triage Notes (Signed)
Pt reports her blood sugars have been 300+ . Feels tingling all over and chest is tight.     Pt has an appointment with her PCP at the end of this month.

## 2022-11-10 NOTE — ED Notes (Signed)
Pt states she did not take regular medications this AM d/t severe headache.

## 2022-11-10 NOTE — ED Notes (Addendum)
Patient is being discharged from the Urgent Care and sent to the Emergency Department via POS . Per provider, patient is in need of higher level of care due to hyperglycemia. Patient is aware and verbalizes understanding of plan of care.  Vitals:   11/10/22 0907  BP: (!) 161/94  Pulse: 70  Resp: 20  Temp: 98.1 F (36.7 C)  SpO2: 93%

## 2022-11-10 NOTE — ED Triage Notes (Signed)
Reports blood sugar has been high for a week but woke up this morning feeling dizzy.  Reports tingling in her face and tightness in her chest. Denies cough and urinary symptoms.

## 2022-11-10 NOTE — Discharge Instructions (Signed)
She will proceed to the emergency room for further evaluation

## 2022-11-10 NOTE — ED Provider Notes (Signed)
[place] EMERGENCY DEPARTMENT AT [facility] Provider Note   CSN: [id number] Arrival date & time: 11/10/22  1024     History  Chief Complaint  Patient presents with   Hyperglycemia    Amber Salas is a 47 y.o. female with history of obesity, diabetes, presented to ED with constellation of symptoms.  Patient reports that her primary concern was that she woke up with a headache, although she does suffer from chronic headaches and migraines is felt worse than normal this morning.  The headache is significantly improved that she took Excedrin at home.  She is also been having a loss of appetite, some malaise, paresthesias in her hands and feet.  She has been compliant with her medications.  Average glucose at home tends to run around 200.  Today at the urgent care where she went originally, her blood sugar was 300.  She had a UA which I reviewed which showed glucose in the urine but no evidence of infection.  She was advised to come into the ED for further evaluation.  She does report that she has had some intermittent chest pressures which is a chronic ongoing issue for her, for which she sees a cardiologist.  She denies history of coronary disease or MI.  HPI     Home Medications Prior to Admission medications   Medication Sig Start Date End Date Taking? Authorizing Provider  albuterol (PROAIR HFA) 108 (90 BASE) MCG/ACT inhaler Inhale 2 puffs into the lungs every 4 (four) hours as needed for wheezing or shortness of breath. 01/14/13   Sandford Craze, NP  albuterol (PROVENTIL) (2.5 MG/3ML) 0.083% nebulizer solution Take 3 mLs (2.5 mg total) by nebulization every 4 (four) hours as needed. For shortness of breath or wheezing 01/14/13   Sandford Craze, NP  cetirizine (ZYRTEC) 10 MG tablet Take 10 mg by mouth daily.    [provider]  diclofenac (VOLTAREN) 75 MG EC tablet Take 75 mg by mouth 2 (two) times daily. 09/20/21   [provider]  Dulaglutide  (TRULICITY) 3 MG/0.5ML SOPN Inject 3 mg into the skin once a week. Patient not taking: Reported on 12/08/2021 04/11/21     Dulaglutide (TRULICITY) 3 MG/0.5ML SOPN Inject 3 mg into the skin once a week at 0900. 07/01/22     glipiZIDE (GLUCOTROL) 10 MG tablet Take 10 mg by mouth 2 (two) times daily. 12/01/21   [provider]  JARDIANCE 25 MG TABS tablet Take 25 mg by mouth daily. 12/01/21   [provider]  losartan-hydrochlorothiazide (HYZAAR) 100-25 MG tablet Take 1 tablet by mouth daily. 10/07/21   [provider]  naproxen (NAPROSYN) 500 MG tablet Take 1 tablet (500 mg total) by mouth 2 (two) times daily. 07/02/19   Wieters, Hallie C, PA-C  Nutritional Supplements (LIPOTROPIC COMPLEX PO) Take 0.5 mLs by mouth in the morning, at noon, and at bedtime.    [provider]  tiotropium (SPIRIVA) 18 MCG inhalation capsule Place 18 mcg into inhaler and inhale daily.    [provider]  Vitamin D, Ergocalciferol, (DRISDOL) 50000 UNITS CAPS capsule Take 1 capsule (50,000 Units total) by mouth every 7 (seven) days. Patient not taking: Reported on 08/29/2017 10/06/14   Sheliah Hatch, MD      Allergies    Amlodipine, Peppermint oil, Fluticasone-salmeterol, Latex, and Spinach    Review of Systems   Review of Systems  Physical Exam Updated Vital Signs BP (!) 119/93   Pulse 79   Temp  98.7 F (37.1 C)   Resp 15   Ht 5\' 7"  (1.702 m)   Wt (!) 168.7 kg   LMP 06/27/2012   SpO2 100%   BMI 58.26 kg/m  Physical Exam Constitutional:      General: She is not in acute distress.    Appearance: She is obese.  HENT:     Head: Normocephalic and atraumatic.  Eyes:     Conjunctiva/sclera: Conjunctivae normal.     Pupils: Pupils are equal, round, and reactive to light.  Cardiovascular:     Rate and Rhythm: Normal rate and regular rhythm.  Pulmonary:     Effort: Pulmonary effort is normal. No respiratory distress.  Abdominal:     General: There is no  distension.     Tenderness: There is no abdominal tenderness.  Skin:    General: Skin is warm and dry.  Neurological:     General: No focal deficit present.     Mental Status: She is alert and oriented to person, place, and time. Mental status is at baseline.  Psychiatric:        Mood and Affect: Mood normal.        Behavior: Behavior normal.     ED Results / Procedures / Treatments   Labs (all labs ordered are listed, but only abnormal results are displayed) Labs Reviewed  BASIC METABOLIC PANEL - Abnormal; Notable for the following components:      Result Value   Sodium 134 (*)    Glucose, Bld 303 (*)    All other components within normal limits  URINALYSIS, ROUTINE W REFLEX MICROSCOPIC - Abnormal; Notable for the following components:   Glucose, UA 500 (*)    All other components within normal limits  CBG MONITORING, ED - Abnormal; Notable for the following components:   Glucose-Capillary 270 (*)    All other components within normal limits  CBG MONITORING, ED - Abnormal; Notable for the following components:   Glucose-Capillary 225 (*)    All other components within normal limits  SARS CORONAVIRUS 2 BY RT PCR  CBC  TROPONIN I (HIGH SENSITIVITY)  TROPONIN I (HIGH SENSITIVITY)    EKG EKG Interpretation Date/Time:  Monday November 10 2022 10:38:10 EDT Ventricular Rate:  68 PR Interval:  206 QRS Duration:  76 QT Interval:  402 QTC Calculation: 427 R Axis:   29  Text Interpretation: Normal sinus rhythm When compared with ECG of 08-Dec-2021 10:55, No sig changes, T wave inversions in lateral leads noted on prior tracing Confirmed by Alvester Chou [phone] on 11/10/2022 10:44:44 AM  Radiology DG Chest 2 View  Result Date: 11/10/2022 CLINICAL DATA:  Chest discomfort EXAM: CHEST - 2 VIEW COMPARISON:  X-ray 12/08/2021 FINDINGS: No consolidation, pneumothorax or effusion. No edema. Normal cardiopericardial silhouette. Overlapping cardiac leads. IMPRESSION: No acute  cardiopulmonary disease. Electronically Signed   By: Karen Kays M.D.   On: 11/10/2022 12:56    Procedures Procedures    Medications Ordered in ED Medications  sodium chloride 0.9 % bolus 1,000 mL (0 mLs Intravenous Paused 11/10/22 1217)  ketorolac (TORADOL) 15 MG/ML injection 15 mg (15 mg Intravenous Given 11/10/22 1208)  metoCLOPramide (REGLAN) injection 10 mg (10 mg Intravenous Given 11/10/22 1209)  insulin aspart (novoLOG) injection 5 Units (5 Units Subcutaneous Given 11/10/22 1215)    ED Course/ Medical Decision Making/ A&P Clinical Course as of 11/10/22 1337  Mon Nov 10, 2022  1335 Patient reassessed and feeling much better after the medications.  No chest discomfort.  Initial troponin unremarkable.  Hyperglycemia is improved, but she will need to follow-up with her PCPs office for blood sugar reevaluation.  She already sees a cardiologist for her chronic ongoing chest pain problems.  Okay for discharge [MT]    Clinical Course User Index [MT] Elaiza Shoberg, Kermit Balo, MD                             Medical Decision Making Amount and/or Complexity of Data Reviewed Labs: ordered. Radiology: ordered.  Risk Prescription drug management.   This patient presents to the ED with concern for headache, improving, chest discomfort, tingling, hyperglycemia. This involves an extensive number of treatment options, and is a complaint that carries with it a high risk of complications and morbidity.  The differential diagnosis includes likely multifactorial issues here, with chronic migraines and chronic chest pain, also has hyperglycemia but may have poorly controlled diabetes at baseline.  A1c was > 7 on last check  year ago.    Patient's headache is no red flag features to raise concern for acute intracranial process, including meningitis, bleed, tumor.  Do not see indication for neuroimaging.  She has a benign neurological exam.  I suspect this is likely headache or perhaps migraine will treat with IV  medications  Co-morbidities that complicate the patient evaluation: History of migraines, diabetes, obesity   External records from outside source obtained and reviewed including UC office evaluation  I ordered and personally interpreted labs.  The pertinent results include: No emergent findings.  Some mild to moderate hyperglycemia without evidence of DKA.  Troponin unremarkable  I ordered imaging studies including x-ray of the chest I independently visualized and interpreted imaging which showed no emergent finding I agree with the radiologist interpretation  The patient was maintained on a cardiac monitor.  I personally viewed and interpreted the cardiac monitored which showed an underlying rhythm of: Sinus rhythm  Per my interpretation the patient's ECG shows sinus rhythm no acute ischemic findings  I ordered medication including IV migraine medications, small dose insulin for hyperglycemia  No evidence of DKA.  No ketones on patient's urinalysis from the office today, and no elevated anion gap on her labs today.  I have reviewed the patients home medicines and have made adjustments as needed  After the interventions noted above, I reevaluated the patient and found that they have: improved   Dispostion:  After consideration of the diagnostic results and the patients response to treatment, I feel that the patent would benefit from close outpatient follow up.         Final Clinical Impression(s) / ED Diagnoses Final diagnoses:  Hyperglycemia  Nonintractable headache, unspecified chronicity pattern, unspecified headache type  Chest pain, unspecified type    Rx / DC Orders ED Discharge Orders     None         Terald Sleeper, MD 11/10/22 1337

## 2022-11-10 NOTE — ED Provider Notes (Signed)
[facility]    CSN: [id number] Arrival date & time: 11/10/22  0853      History   Chief Complaint Chief Complaint  Patient presents with   Hyperglycemia    HPI Amber Salas is a 47 y.o. female.    Hyperglycemia  Here for elevated blood sugars and tingling in her extremities.  The last 2 weeks her sugars have been higher than usual -much of the time in the 300 range.  She notes numbness and tingling in both hands some on her face and on her feet.  She does have trouble with neuropathy symptoms when her sugars run higher  No fever or chills.  She has had a little chest tightness that is relieved by her inhaler.  She does have a history of asthma  She is currently taking Jardiance 25 mg, Trulicity 3 mg weekly, and glipizide 10 mg twice daily.  She has not tolerated metformin in the past.  No nausea vomiting or diarrhea.  No hematuria or dysuria.   Past Medical History:  Diagnosis Date   Asthma    history of   Atypical chest pain    a. 02/2014 Cath: nl cors, EF 65-70%->d/c'd on nsaid and PPI.   Diabetes mellitus without complication (HCC)    Extreme obesity    GERD (gastroesophageal reflux disease)    H/O hiatal hernia    Headache(784.0)    migraines   Hypertension    Obstructive sleep apnea on CPAP    OSA (obstructive sleep apnea) 08/30/2010   Retinal hole or tear    left eye- 1996 due to injury    Patient Active Problem List   Diagnosis Date Noted   Low back pain with left-sided sciatica 07/28/2014   Exertional chest pain 03/07/2014   Unstable angina (HCC) 03/07/2014   Essential hypertension 03/07/2014   Abnormal ECG 03/07/2014   Chest pain at rest 03/07/2014   Chest pain 02/02/2014   HTN (hypertension) 07/04/2013   Heart murmur, systolic 03/08/2013   Extreme obesity 03/07/2013   Obstructive apnea 03/07/2013   Encounter for routine gynecological examination 06/30/2012   Atypical chest pain 04/05/2012   GERD (gastroesophageal reflux  disease) 12/30/2011   Other dysphagia 12/30/2011   Thyroid cyst 12/01/2011   Chronic cough 10/31/2011   General medical examination 05/27/2011   OSA (obstructive sleep apnea) 08/30/2010   HIP PAIN, LEFT 03/13/2010   Morbid obesity (HCC) 10/17/2009   ALLERGIC RHINITIS 09/19/2009   Asthma 09/19/2009    Past Surgical History:  Procedure Laterality Date   ABDOMINAL HYSTERECTOMY     CESAREAN SECTION  4/04   ESOPHAGOGASTRODUODENOSCOPY  12/30/2011   Procedure: ESOPHAGOGASTRODUODENOSCOPY (EGD);  Surgeon: [name] Dare, MD,FACG;  Location: [facility];  Service: Endoscopy;  Laterality: N/A;   EYE SURGERY  11/2010   lasar repair of retinal tear   HERNIA REPAIR  04/2009   abdominal hernia   LAPAROSCOPIC TOTAL HYSTERECTOMY  03/17/13   LEFT HEART CATHETERIZATION WITH CORONARY ANGIOGRAM N/A 03/07/2014   Procedure: LEFT HEART CATHETERIZATION WITH CORONARY ANGIOGRAM;  Surgeon: Micheline Chapman, MD;  Location: [facility] CATH LAB;  Service: Cardiovascular;  Laterality: N/A;   TUBAL LIGATION     WISDOM TOOTH EXTRACTION  1999    OB History   No obstetric history on file.      Home Medications    Prior to Admission medications   Medication Sig Start Date End Date Taking? Authorizing Provider  albuterol (PROAIR HFA) 108 (90 BASE) MCG/ACT inhaler Inhale 2  puffs into the lungs every 4 (four) hours as needed for wheezing or shortness of breath. 01/14/13   Sandford Craze, NP  albuterol (PROVENTIL) (2.5 MG/3ML) 0.083% nebulizer solution Take 3 mLs (2.5 mg total) by nebulization every 4 (four) hours as needed. For shortness of breath or wheezing 01/14/13   Sandford Craze, NP  cetirizine (ZYRTEC) 10 MG tablet Take 10 mg by mouth daily.    [provider]  diclofenac (VOLTAREN) 75 MG EC tablet Take 75 mg by mouth 2 (two) times daily. 09/20/21   [provider]  Dulaglutide (TRULICITY) 3 MG/0.5ML SOPN Inject 3 mg into the skin once a week. Patient not taking: Reported on 12/08/2021  04/11/21     Dulaglutide (TRULICITY) 3 MG/0.5ML SOPN Inject 3 mg into the skin once a week at 0900. 07/01/22     glipiZIDE (GLUCOTROL) 10 MG tablet Take 10 mg by mouth 2 (two) times daily. 12/01/21   [provider]  JARDIANCE 25 MG TABS tablet Take 25 mg by mouth daily. 12/01/21   [provider]  losartan-hydrochlorothiazide (HYZAAR) 100-25 MG tablet Take 1 tablet by mouth daily. 10/07/21   [provider]  naproxen (NAPROSYN) 500 MG tablet Take 1 tablet (500 mg total) by mouth 2 (two) times daily. 07/02/19   Wieters, Hallie C, PA-C  Nutritional Supplements (LIPOTROPIC COMPLEX PO) Take 0.5 mLs by mouth in the morning, at noon, and at bedtime.    [provider]  tiotropium (SPIRIVA) 18 MCG inhalation capsule Place 18 mcg into inhaler and inhale daily.    [provider]  Vitamin D, Ergocalciferol, (DRISDOL) 50000 UNITS CAPS capsule Take 1 capsule (50,000 Units total) by mouth every 7 (seven) days. Patient not taking: Reported on 08/29/2017 10/06/14   Sheliah Hatch, MD    Family History Family History  Problem Relation Age of Onset   Heart disease Maternal Grandmother    Hypertension Maternal Grandmother    Bipolar disorder Maternal Grandmother    Hypertension Maternal Grandfather    Hypertension Mother    Diabetes type II Mother    Other Mother        cervical dysplasia   Ovarian cancer Mother    Hypertension Father    Bipolar disorder Father    Hypertension Paternal Grandmother    Hypertension Paternal Grandfather    Hypertension Sister    Heart attack Neg Hx    Hyperlipidemia Neg Hx    Sudden death Neg Hx    Breast cancer Neg Hx     Social History Social History   Tobacco Use   Smoking status: Never   Smokeless tobacco: Never  Substance Use Topics   Alcohol use: No    Alcohol/week: 0.0 standard drinks of alcohol   Drug use: No     Allergies   Amlodipine, Peppermint oil, Fluticasone-salmeterol, Latex, and  Spinach   Review of Systems Review of Systems   Physical Exam Triage Vital Signs ED Triage Vitals  Enc Vitals Group     BP 11/10/22 0907 (!) 161/94     Pulse Rate 11/10/22 0907 70     Resp 11/10/22 0907 20     Temp 11/10/22 0907 98.1 F (36.7 C)     Temp Source 11/10/22 0907 Oral     SpO2 11/10/22 0907 93 %     Weight --      Height --      Head Circumference --      Peak Flow --  Pain Score 11/10/22 0909 0     Pain Loc --      Pain Edu? --      Excl. in GC? --    No data found.  Updated Vital Signs BP (!) 161/94 (BP Location: Left Wrist)   Pulse 70   Temp 98.1 F (36.7 C) (Oral)   Resp 20   LMP 06/27/2012   SpO2 93%   Visual Acuity Right Eye Distance:   Left Eye Distance:   Bilateral Distance:    Right Eye Near:   Left Eye Near:    Bilateral Near:     Physical Exam Vitals reviewed.  Constitutional:      General: She is not in acute distress.    Appearance: She is not ill-appearing, toxic-appearing or diaphoretic.  HENT:     Mouth/Throat:     Mouth: Mucous membranes are moist.  Eyes:     Extraocular Movements: Extraocular movements intact.     Conjunctiva/sclera: Conjunctivae normal.     Pupils: Pupils are equal, round, and reactive to light.  Cardiovascular:     Rate and Rhythm: Normal rate and regular rhythm.     Heart sounds: No murmur heard. Pulmonary:     Effort: Pulmonary effort is normal. No respiratory distress.     Breath sounds: Normal breath sounds. No stridor. No wheezing, rhonchi or rales.  Musculoskeletal:     Cervical back: Neck supple.  Lymphadenopathy:     Cervical: No cervical adenopathy.  Skin:    Coloration: Skin is not jaundiced or pale.  Neurological:     General: No focal deficit present.     Mental Status: She is alert and oriented to person, place, and time.  Psychiatric:        Behavior: Behavior normal.      UC Treatments / Results  Labs (all labs ordered are listed, but only abnormal results are  displayed) Labs Reviewed  POCT FASTING [facility] MANUAL ENTRY - Abnormal; Notable for the following components:      Result Value   POCT Glucose ([facility]) 309 (*)    All other components within normal limits    EKG   Radiology No results found.  Procedures Procedures (including critical care time)  Medications Ordered in UC Medications - No data to display  Initial Impression / Assessment and Plan / UC Course  I have reviewed the triage vital signs and the nursing notes.  Pertinent labs & imaging results that were available during my care of the patient were reviewed by me and considered in my medical decision making (see chart for details).       Fingerstick glucose here is 309.  I reviewed her med list with her.  There is nothing on her med list that I can increase or tweak the dose to help her sugars improved.  She does not tolerate metformin so I cannot add that.  I have asked her to proceed to the emergency room for urgent evaluation.  She is agreeable and will go by private car. Final Clinical Impressions(s) / UC Diagnoses   Final diagnoses:  Hyperglycemia  Diabetes mellitus type 2, noninsulin dependent [facility])     Discharge Instructions      She will proceed to the emergency room for further evaluation     ED Prescriptions   None    PDMP not reviewed this encounter.   Zenia Resides, MD 11/10/22 [phone]

## 2022-11-10 NOTE — ED Notes (Signed)
CBG 225 MD notified 

## 2022-12-01 ENCOUNTER — Other Ambulatory Visit: Payer: Self-pay

## 2023-05-20 ENCOUNTER — Other Ambulatory Visit (HOSPITAL_BASED_OUTPATIENT_CLINIC_OR_DEPARTMENT_OTHER): Payer: Self-pay

## 2023-05-20 MED ORDER — TRULICITY 3 MG/0.5ML ~~LOC~~ SOAJ
3.0000 mg | SUBCUTANEOUS | 3 refills | Status: DC
  Filled 2023-05-20: qty 6, 84d supply, fill #0
  Filled 2023-06-01 – 2023-06-05 (×2): qty 2, 28d supply, fill #0
  Filled 2023-07-27: qty 2, 28d supply, fill #1
  Filled 2023-07-28: qty 6, 84d supply, fill #0
  Filled 2023-07-28 (×2): qty 6, 84d supply, fill #1

## 2023-05-21 ENCOUNTER — Other Ambulatory Visit (HOSPITAL_BASED_OUTPATIENT_CLINIC_OR_DEPARTMENT_OTHER): Payer: Self-pay

## 2023-05-25 ENCOUNTER — Other Ambulatory Visit (HOSPITAL_BASED_OUTPATIENT_CLINIC_OR_DEPARTMENT_OTHER): Payer: Self-pay

## 2023-05-26 ENCOUNTER — Other Ambulatory Visit (HOSPITAL_BASED_OUTPATIENT_CLINIC_OR_DEPARTMENT_OTHER): Payer: Self-pay

## 2023-06-01 ENCOUNTER — Other Ambulatory Visit (HOSPITAL_BASED_OUTPATIENT_CLINIC_OR_DEPARTMENT_OTHER): Payer: Self-pay

## 2023-06-01 ENCOUNTER — Emergency Department (HOSPITAL_BASED_OUTPATIENT_CLINIC_OR_DEPARTMENT_OTHER): Payer: BC Managed Care – PPO

## 2023-06-01 ENCOUNTER — Encounter (HOSPITAL_BASED_OUTPATIENT_CLINIC_OR_DEPARTMENT_OTHER): Payer: Self-pay | Admitting: Emergency Medicine

## 2023-06-01 ENCOUNTER — Emergency Department (HOSPITAL_BASED_OUTPATIENT_CLINIC_OR_DEPARTMENT_OTHER)
Admission: EM | Admit: 2023-06-01 | Discharge: 2023-06-01 | Disposition: A | Discharge status: Home / Self Care | Payer: BC Managed Care – PPO | Attending: Emergency Medicine | Admitting: Emergency Medicine

## 2023-06-01 ENCOUNTER — Other Ambulatory Visit: Payer: Self-pay

## 2023-06-01 DIAGNOSIS — Z91040 Latex allergy status: Secondary | ICD-10-CM | POA: Insufficient documentation

## 2023-06-01 DIAGNOSIS — Z79899 Other long term (current) drug therapy: Secondary | ICD-10-CM | POA: Diagnosis not present

## 2023-06-01 DIAGNOSIS — I10 Essential (primary) hypertension: Secondary | ICD-10-CM | POA: Diagnosis not present

## 2023-06-01 DIAGNOSIS — R1032 Left lower quadrant pain: Secondary | ICD-10-CM | POA: Insufficient documentation

## 2023-06-01 DIAGNOSIS — E119 Type 2 diabetes mellitus without complications: Secondary | ICD-10-CM | POA: Diagnosis not present

## 2023-06-01 DIAGNOSIS — Z7984 Long term (current) use of oral hypoglycemic drugs: Secondary | ICD-10-CM | POA: Diagnosis not present

## 2023-06-01 LAB — URINALYSIS, ROUTINE W REFLEX MICROSCOPIC
Bacteria, UA: NONE SEEN
Bilirubin Urine: NEGATIVE
Glucose, UA: >1000 mg/dL — AB
Hgb urine dipstick: NEGATIVE
Ketones, ur: 15 mg/dL — AB
Leukocytes,Ua: NEGATIVE
Nitrite: NEGATIVE
Protein, ur: NEGATIVE mg/dL
Specific Gravity, Urine: 1.029 (ref 1.005–1.030)
pH: 5 (ref 5.0–8.0)

## 2023-06-01 LAB — COMPREHENSIVE METABOLIC PANEL
ALT: 18 U/L (ref 0–44)
AST: 15 U/L (ref 15–41)
Albumin: 4.5 g/dL (ref 3.5–5.0)
Alkaline Phosphatase: 97 U/L (ref 38–126)
Anion gap: 9 (ref 5–15)
BUN: 20 mg/dL (ref 6–20)
CO2: 26 mmol/L (ref 22–32)
Calcium: 10.6 mg/dL — ABNORMAL HIGH (ref 8.9–10.3)
Chloride: 102 mmol/L (ref 98–111)
Creatinine, Ser: 0.71 mg/dL (ref 0.44–1.00)
GFR, Estimated: >60 mL/min (ref >60)
Glucose, Bld: 189 mg/dL — ABNORMAL HIGH (ref 70–99)
Potassium: 4.6 mmol/L (ref 3.5–5.1)
Sodium: 137 mmol/L (ref 135–145)
Total Bilirubin: 0.6 mg/dL (ref 0.0–1.2)
Total Protein: 7.9 g/dL (ref 6.5–8.1)

## 2023-06-01 LAB — LIPASE, BLOOD: Lipase: 31 U/L (ref 11–51)

## 2023-06-01 LAB — CBC
HCT: 45.1 % (ref 36.0–46.0)
Hemoglobin: 15 g/dL (ref 12.0–15.0)
MCH: 28.6 pg (ref 26.0–34.0)
MCHC: 33.3 g/dL (ref 30.0–36.0)
MCV: 86.1 fL (ref 80.0–100.0)
Platelets: 302 10*3/uL (ref 150–400)
RBC: 5.24 MIL/uL — ABNORMAL HIGH (ref 3.87–5.11)
RDW: 13.9 % (ref 11.5–15.5)
WBC: 9.8 10*3/uL (ref 4.0–10.5)
nRBC: 0 % (ref 0.0–0.2)

## 2023-06-01 LAB — PREGNANCY, URINE: Preg Test, Ur: NEGATIVE

## 2023-06-01 MED ORDER — IOHEXOL 300 MG/ML  SOLN
100.0000 mL | Freq: Once | INTRAMUSCULAR | Status: AC | PRN
  Administered 2023-06-01: 100 mL via INTRAVENOUS

## 2023-06-01 MED ORDER — ONDANSETRON HCL 4 MG/2ML IJ SOLN
4.0000 mg | Freq: Once | INTRAMUSCULAR | Status: AC
  Administered 2023-06-01: 4 mg via INTRAVENOUS
  Filled 2023-06-01: qty 2

## 2023-06-01 MED ORDER — MORPHINE SULFATE (PF) 4 MG/ML IV SOLN
4.0000 mg | Freq: Once | INTRAVENOUS | Status: AC
  Administered 2023-06-01: 4 mg via INTRAVENOUS
  Filled 2023-06-01: qty 1

## 2023-06-01 NOTE — ED Notes (Signed)
Patient transported to CT 

## 2023-06-01 NOTE — ED Notes (Signed)
Ambulatory to restroom

## 2023-06-01 NOTE — ED Provider Notes (Signed)
[place] EM[facility] DE[facility]MENT AT [address] [facility] Provider Note   CSN: [id number] Arrival date & time: 06/01/23  1058     History  Chief Complaint  Patient presents with   Abdominal Pain    Amber Salas is a 48 y.o. female.   Abdominal Pain   48 year old female presents emergency department with complaints of left lower abdominal pain.  States that abdominal pain began 2 days ago when she was walking.  Reports persistent pain since onset.  States that she has had hernia repair with mesh placement x 2 and is concerned about complications regarding mesh that was placed.  Most recent operation was performed October 2020.  Denies any fever, chills, nausea, vomiting, urinary symptoms, change in bowel habits.  Denies radiation of pain.  States that patient is worsened with any bearing down movement such as urinating, having a bowel movement or coughing as well as with certain movements.  Past medical history significant for diabetes mellitus, GERD, obesity, headache, hypertension, OSA  Home Medications Prior to Admission medications   Medication Sig Start Date End Date Taking? Authorizing Provider  albuterol (PROAIR HFA) 108 (90 BASE) MCG/ACT inhaler Inhale 2 puffs into the lungs every 4 (four) hours as needed for wheezing or shortness of breath. 01/14/13   Sandford Craze, NP  albuterol (PROVENTIL) (2.5 MG/3ML) 0.083% nebulizer solution Take 3 mLs (2.5 mg total) by nebulization every 4 (four) hours as needed. For shortness of breath or wheezing 01/14/13   Sandford Craze, NP  cetirizine (ZYRTEC) 10 MG tablet Take 10 mg by mouth daily.    [provider]  diclofenac (VOLTAREN) 75 MG EC tablet Take 75 mg by mouth 2 (two) times daily. 09/20/21   [provider]  Dulaglutide (TRULICITY) 3 MG/0.5ML SOAJ Inject 3 mg into the skin once a week. 05/20/23     Dulaglutide (TRULICITY) 3 MG/0.5ML SOPN Inject 3 mg into the skin once a week. Patient not taking: Reported on  12/08/2021 04/11/21     glipiZIDE (GLUCOTROL) 10 MG tablet Take 10 mg by mouth 2 (two) times daily. 12/01/21   [provider]  JARDIANCE 25 MG TABS tablet Take 25 mg by mouth daily. 12/01/21   [provider]  losartan-hydrochlorothiazide (HYZAAR) 100-25 MG tablet Take 1 tablet by mouth daily. 10/07/21   [provider]  naproxen (NAPROSYN) 500 MG tablet Take 1 tablet (500 mg total) by mouth 2 (two) times daily. 07/02/19   Wieters, Hallie C, PA-C  Nutritional Supplements (LIPOTROPIC COMPLEX PO) Take 0.5 mLs by mouth in the morning, at noon, and at bedtime.    [provider]  tiotropium (SPIRIVA) 18 MCG inhalation capsule Place 18 mcg into inhaler and inhale daily.    [provider]  Vitamin D, Ergocalciferol, (DRISDOL) 50000 UNITS CAPS capsule Take 1 capsule (50,000 Units total) by mouth every 7 (seven) days. Patient not taking: Reported on 08/29/2017 10/06/14   Sheliah Hatch, MD      Allergies    Amlodipine, Peppermint oil, Fluticasone-salmeterol, Latex, and Spinach    Review of Systems   Review of Systems  Gastrointestinal:  Positive for abdominal pain.  All other systems reviewed and are negative.   Physical Exam Updated Vital Signs BP (!) 143/95   Pulse 71   Temp 98.3 F (36.8 C) (Oral)   Resp 18   Ht 5\' 7"  (1.702 m)   Wt (!) 169.6 kg   LMP 06/27/2012   SpO2 96%   BMI 58.58 kg/m  Physical  Exam Vitals and nursing note reviewed.  Constitutional:      General: She is not in acute distress.    Appearance: She is well-developed.  HENT:     Head: Normocephalic and atraumatic.  Eyes:     Conjunctiva/sclera: Conjunctivae normal.  Cardiovascular:     Rate and Rhythm: Normal rate and regular rhythm.     Heart sounds: No murmur heard. Pulmonary:     Effort: Pulmonary effort is normal. No respiratory distress.     Breath sounds: Normal breath sounds.  Abdominal:     Palpations: Abdomen is soft.     Tenderness: There is abdominal  tenderness in the left lower quadrant.     Comments: No obvious palpable mass/deformity left lower quadrant of abdomen.  Difficult to ascertain clinically given amount of pannus present.  Musculoskeletal:        General: No swelling.     Cervical back: Neck supple.  Skin:    General: Skin is warm and dry.     Capillary Refill: Capillary refill takes less than 2 seconds.  Neurological:     Mental Status: She is alert.  Psychiatric:        Mood and Affect: Mood normal.     ED Results / Procedures / Treatments   Labs (all labs ordered are listed, but only abnormal results are displayed) Labs Reviewed  COMPREHENSIVE METABOLIC PANEL - Abnormal; Notable for the following components:      Result Value   Glucose, Bld 189 (*)    Calcium 10.6 (*)    All other components within normal limits  CBC - Abnormal; Notable for the following components:   RBC 5.24 (*)    All other components within normal limits  URINALYSIS, ROUTINE W REFLEX MICROSCOPIC - Abnormal; Notable for the following components:   Color, Urine COLORLESS (*)    Glucose, UA >1,000 (*)    Ketones, ur 15 (*)    All other components within normal limits  PREGNANCY, URINE  LIPASE, BLOOD    EKG None  Radiology CT ABDOMEN PELVIS W CONTRAST Result Date: 06/01/2023 CLINICAL DATA:  Left lower quadrant abdominal pain. EXAM: CT ABDOMEN AND PELVIS WITH CONTRAST TECHNIQUE: Multidetector CT imaging of the abdomen and pelvis was performed using the standard protocol following bolus administration of intravenous contrast. RADIATION DOSE REDUCTION: This exam was performed according to the departmental dose-optimization program which includes automated exposure control, adjustment of the mA and/or kV according to patient size and/or use of iterative reconstruction technique. CONTRAST:  OMNIPAQUE IOHEXOL 300 MG/ML  SOLN COMPARISON:  Abdominal ultrasound dated November 11, 2011. CT abdomen pelvis dated March 26, 2009. FINDINGS: Lower  chest: No acute abnormality. Hepatobiliary: No focal liver abnormality is seen. No gallstones, gallbladder wall thickening, or biliary dilatation. Pancreas: Unremarkable. No pancreatic ductal dilatation or surrounding inflammatory changes. Spleen: Normal in size without focal abnormality. Adrenals/Urinary Tract: Adrenal glands are unremarkable. Kidneys are normal, without renal calculi, focal lesion, or hydronephrosis. Bladder is unremarkable. Stomach/Bowel: Stomach is within normal limits. Appendix appears normal. No evidence of bowel wall thickening, distention, or inflammatory changes. Vascular/Lymphatic: No significant vascular findings are present. No enlarged abdominal or pelvic lymph nodes. Reproductive: Status post hysterectomy. No adnexal masses. Other: Prior ventral hernia repair. There are two small fat containing supraumbilical ventral hernias. No free fluid or pneumoperitoneum. Musculoskeletal: No acute or significant osseous findings. IMPRESSION: 1. No acute intra-abdominal process. Electronically Signed   By: Obie Dredge M.D.   On: 06/01/2023 15:57  Procedures Procedures    Medications Ordered in ED Medications  morphine (PF) 4 MG/ML injection 4 mg (4 mg Intravenous Given 06/01/23 1300)  ondansetron (ZOFRAN) injection 4 mg (4 mg Intravenous Given 06/01/23 1300)  iohexol (OMNIPAQUE) 300 MG/ML solution 100 mL (100 mLs Intravenous Contrast Given 06/01/23 1416)    ED Course/ Medical Decision Making/ A&P                                 Medical Decision Making Amount and/or Complexity of Data Reviewed Labs: ordered. Radiology: ordered.  Risk Prescription drug management.   This patient presents to the ED for concern of abdominal pain, this involves an extensive number of treatment options, and is a complaint that carries with it a high risk of complications and morbidity.  The differential diagnosis includes hernia, diverticulitis appendicitis, SBO/LBO, volvulus, cystitis,  pyelonephritis, nephrolithiasis, ovarian cyst, other   Co morbidities that complicate the patient evaluation  See HPI   Additional history obtained:  Additional history obtained from EMR External records from outside source obtained and reviewed including hospital records   Lab Tests:  I Ordered, and personally interpreted labs.  The pertinent results include: No leukocytosis.  No evidence of anemia.  Placed within range.  No electrolyte abnormalities besides slight hypercalcemia of 10.6.  No transaminitis.  No renal dysfunction.  UA with greater than thousand glucose, 15 ketones otherwise unremarkable.  Lipase within normal limits.   Imaging Studies ordered:  I ordered imaging studies including CT abdomen pelvis I independently visualized and interpreted imaging which showed no acute abnormality I agree with the radiologist interpretation   Cardiac Monitoring: / EKG:  The patient was maintained on a cardiac monitor.  I personally viewed and interpreted the cardiac monitored which showed an underlying rhythm of: Sinus rhythm   Consultations Obtained:  N/a   Problem List / ED Course / Critical interventions / Medication management  Left lower quadrant abdominal pain I ordered medication including morphine   Reevaluation of the patient after these medicines showed that the patient improved I have reviewed the patients home medicines and have made adjustments as needed   Social Determinants of Health:  Denies tobacco, licit drug use   Test / Admission - Considered:  Left quadrant abdominal pain Vitals signs significant for hypertension blood pressure 141/83. Otherwise within normal range and stable throughout visit. Laboratory/imaging studies significant for: Above 48 year old female presents emergency department with complaints of left lower abdominal pain.  Symptoms present for the past 2 days.  On exam, reducible with reproducible tenderness left lower quadrant.   Labs reassuring.  CT imaging without any acute process.  Unsure of exact etiology of patient's abdominal pain but symptoms do not seem emergent/life-threatening in nature.  No overlying rash concerning for HZV.  Will treat patient's symptoms with Tylenol and recommend follow-up with PCP in the outpatient setting.  Treatment plan discussed at length with patient and she acknowledged understanding was agreeable to said plan.  Patient overall well-appearing, afebrile in no acute distress. Worrisome signs and symptoms were discussed with the patient, and the patient acknowledged understanding to return to the ED if noticed. Patient was stable upon discharge.          Final Clinical Impression(s) / ED Diagnoses Final diagnoses:  Left lower quadrant abdominal pain    Rx / DC Orders ED Discharge Orders     None  Peter Garter, [place] 06/01/23 1704    Loetta Rough, MD 06/02/23 2119

## 2023-06-01 NOTE — ED Notes (Signed)
  Patient unable to provide urine sample at this time. Specimen cup left at bedside and reviewed instructions for clean catch collection. Call bell attached to bed and advice patient to call for assistance if needed.

## 2023-06-01 NOTE — ED Notes (Signed)
Reviewed AVS/discharge instruction with patient. Time allotted for and all questions answered. Patient is agreeable for d/c and escorted to ed exit by staff.  

## 2023-06-01 NOTE — Discharge Instructions (Signed)
As discussed, CT scan appears normal.  Your labs are also reassuring.  I am not sure exactly what is causing your abdominal pain but symptoms do not seem life-threatening or major organ threatening.  You may treat your pain at home with Tylenol or Motrin.  Please do not hesitate to return to emergency department if the worrisome signs and symptoms we discussed become apparent.

## 2023-06-01 NOTE — ED Triage Notes (Signed)
C/o LUQ pain since 1/17. States she is worried about a hernia that she had surgery on in 2020. States hurts worse after eating.

## 2023-06-01 NOTE — ED Notes (Signed)
Patient provided with food for PO challenger

## 2023-06-02 ENCOUNTER — Other Ambulatory Visit (HOSPITAL_BASED_OUTPATIENT_CLINIC_OR_DEPARTMENT_OTHER): Payer: Self-pay

## 2023-06-05 ENCOUNTER — Other Ambulatory Visit (HOSPITAL_BASED_OUTPATIENT_CLINIC_OR_DEPARTMENT_OTHER): Payer: Self-pay

## 2023-06-23 ENCOUNTER — Other Ambulatory Visit (HOSPITAL_BASED_OUTPATIENT_CLINIC_OR_DEPARTMENT_OTHER): Payer: Self-pay

## 2023-07-28 ENCOUNTER — Other Ambulatory Visit (HOSPITAL_COMMUNITY): Payer: Self-pay

## 2023-07-28 ENCOUNTER — Other Ambulatory Visit (HOSPITAL_BASED_OUTPATIENT_CLINIC_OR_DEPARTMENT_OTHER): Payer: Self-pay

## 2023-07-29 ENCOUNTER — Other Ambulatory Visit (HOSPITAL_BASED_OUTPATIENT_CLINIC_OR_DEPARTMENT_OTHER): Payer: Self-pay

## 2023-07-29 ENCOUNTER — Other Ambulatory Visit (HOSPITAL_COMMUNITY): Payer: Self-pay

## 2023-09-17 ENCOUNTER — Other Ambulatory Visit (HOSPITAL_BASED_OUTPATIENT_CLINIC_OR_DEPARTMENT_OTHER): Payer: Self-pay

## 2023-09-17 MED ORDER — MOUNJARO 2.5 MG/0.5ML ~~LOC~~ SOAJ
2.5000 mg | SUBCUTANEOUS | 0 refills | Status: DC
  Filled 2023-09-17 – 2023-09-23 (×4): qty 2, 28d supply, fill #0

## 2023-09-22 ENCOUNTER — Other Ambulatory Visit (HOSPITAL_BASED_OUTPATIENT_CLINIC_OR_DEPARTMENT_OTHER): Payer: Self-pay

## 2023-09-23 ENCOUNTER — Other Ambulatory Visit (HOSPITAL_BASED_OUTPATIENT_CLINIC_OR_DEPARTMENT_OTHER): Payer: Self-pay

## 2023-09-23 ENCOUNTER — Other Ambulatory Visit (HOSPITAL_COMMUNITY): Payer: Self-pay

## 2023-09-23 ENCOUNTER — Other Ambulatory Visit: Payer: Self-pay

## 2023-09-24 ENCOUNTER — Other Ambulatory Visit (HOSPITAL_COMMUNITY): Payer: Self-pay

## 2023-09-24 ENCOUNTER — Other Ambulatory Visit: Payer: Self-pay

## 2023-09-24 ENCOUNTER — Other Ambulatory Visit (HOSPITAL_BASED_OUTPATIENT_CLINIC_OR_DEPARTMENT_OTHER): Payer: Self-pay

## 2023-09-24 MED ORDER — INSULIN ASPART PROT & ASPART (70-30 MIX) 100 UNIT/ML PEN
24.0000 [IU] | PEN_INJECTOR | Freq: Two times a day (BID) | SUBCUTANEOUS | 6 refills | Status: DC
  Filled 2023-09-24 – 2023-10-20 (×3): qty 15, 31d supply, fill #0
  Filled 2023-11-16: qty 15, 31d supply, fill #1
  Filled 2023-12-17: qty 15, 31d supply, fill #2
  Filled 2024-01-17: qty 15, 31d supply, fill #3
  Filled 2024-02-14: qty 15, 31d supply, fill #4
  Filled 2024-03-16: qty 15, 31d supply, fill #5

## 2023-09-24 MED ORDER — EMPAGLIFLOZIN 25 MG PO TABS
25.0000 mg | ORAL_TABLET | Freq: Every day | ORAL | 8 refills | Status: AC
  Filled 2023-09-24 – 2023-10-19 (×4): qty 30, 30d supply, fill #0
  Filled 2023-11-08 – 2023-11-16 (×2): qty 30, 30d supply, fill #1
  Filled 2023-12-17: qty 30, 30d supply, fill #2
  Filled 2024-01-14: qty 30, 30d supply, fill #3
  Filled 2024-02-14 – 2024-02-23 (×2): qty 30, 30d supply, fill #4
  Filled 2024-03-29: qty 30, 30d supply, fill #5
  Filled 2024-04-24: qty 30, 30d supply, fill #6

## 2023-09-24 MED ORDER — PRAVASTATIN SODIUM 20 MG PO TABS
20.0000 mg | ORAL_TABLET | Freq: Every evening | ORAL | 1 refills | Status: DC
  Filled 2023-09-24 (×2): qty 30, 30d supply, fill #0

## 2023-09-24 MED ORDER — DEXCOM G7 SENSOR MISC
1 refills | Status: AC
  Filled 2023-09-24 – 2023-09-28 (×2): qty 3, 30d supply, fill #0

## 2023-09-24 MED ORDER — CELECOXIB 200 MG PO CAPS
200.0000 mg | ORAL_CAPSULE | Freq: Every day | ORAL | 7 refills | Status: AC
  Filled 2023-09-24 – 2023-10-14 (×3): qty 30, 30d supply, fill #0
  Filled 2023-10-19: qty 7, 7d supply, fill #0
  Filled 2023-10-30: qty 30, 30d supply, fill #0
  Filled 2023-10-30: qty 7, 7d supply, fill #1
  Filled 2023-12-01: qty 30, 30d supply, fill #0
  Filled 2023-12-29 (×2): qty 30, 30d supply, fill #1
  Filled 2024-01-27: qty 30, 30d supply, fill #2
  Filled 2024-03-03: qty 30, 30d supply, fill #3
  Filled 2024-03-29: qty 30, 30d supply, fill #4

## 2023-09-24 MED ORDER — TIOTROPIUM BROMIDE MONOHYDRATE 1.25 MCG/ACT IN AERS
2.0000 | INHALATION_SPRAY | Freq: Every day | RESPIRATORY_TRACT | 1 refills | Status: AC
Start: 2023-09-12 — End: ?
  Filled 2023-09-24 – 2023-10-20 (×2): qty 4, 30d supply, fill #0

## 2023-09-24 MED ORDER — GLIPIZIDE 10 MG PO TABS
10.0000 mg | ORAL_TABLET | Freq: Two times a day (BID) | ORAL | 5 refills | Status: DC
  Filled 2023-09-24 (×5): qty 60, 30d supply, fill #0
  Filled 2023-10-20: qty 60, 30d supply, fill #1
  Filled 2023-10-30: qty 60, 30d supply, fill #2
  Filled 2023-11-08: qty 14, 7d supply, fill #2
  Filled 2023-11-17: qty 60, 30d supply, fill #3
  Filled 2023-12-29: qty 60, 30d supply, fill #4
  Filled 2024-01-27: qty 60, 30d supply, fill #5
  Filled 2024-02-23: qty 60, 30d supply, fill #6

## 2023-09-25 ENCOUNTER — Other Ambulatory Visit (HOSPITAL_COMMUNITY): Payer: Self-pay

## 2023-09-25 ENCOUNTER — Other Ambulatory Visit (HOSPITAL_BASED_OUTPATIENT_CLINIC_OR_DEPARTMENT_OTHER): Payer: Self-pay

## 2023-09-25 MED ORDER — DEXCOM G7 SENSOR MISC
11 refills | Status: AC
  Filled 2023-09-25 – 2023-09-28 (×2): qty 3, 30d supply, fill #0
  Filled 2023-10-30: qty 3, 30d supply, fill #1
  Filled 2023-10-30: qty 3, 30d supply, fill #0
  Filled 2023-12-29: qty 3, 30d supply, fill #1
  Filled 2024-01-27: qty 3, 30d supply, fill #2

## 2023-09-26 ENCOUNTER — Other Ambulatory Visit (HOSPITAL_BASED_OUTPATIENT_CLINIC_OR_DEPARTMENT_OTHER): Payer: Self-pay

## 2023-09-26 MED ORDER — ONETOUCH DELICA LANCETS 33G MISC
10 refills | Status: AC
  Filled 2023-09-26: qty 100, 30d supply, fill #0
  Filled 2023-09-28: qty 100, 25d supply, fill #0

## 2023-09-27 ENCOUNTER — Other Ambulatory Visit (HOSPITAL_COMMUNITY): Payer: Self-pay

## 2023-09-28 ENCOUNTER — Other Ambulatory Visit: Payer: Self-pay

## 2023-09-28 ENCOUNTER — Other Ambulatory Visit (HOSPITAL_BASED_OUTPATIENT_CLINIC_OR_DEPARTMENT_OTHER): Payer: Self-pay

## 2023-09-28 ENCOUNTER — Other Ambulatory Visit (HOSPITAL_COMMUNITY): Payer: Self-pay

## 2023-09-28 ENCOUNTER — Other Ambulatory Visit (HOSPITAL_COMMUNITY): Payer: Self-pay | Admitting: Adult Health Nurse Practitioner

## 2023-09-28 MED ORDER — PRAVASTATIN SODIUM 20 MG PO TABS
20.0000 mg | ORAL_TABLET | Freq: Every evening | ORAL | 1 refills | Status: DC
  Filled 2023-09-28 – 2023-10-20 (×3): qty 30, 30d supply, fill #0
  Filled 2023-10-30 – 2023-11-16 (×3): qty 30, 30d supply, fill #1

## 2023-09-29 ENCOUNTER — Other Ambulatory Visit (HOSPITAL_COMMUNITY): Payer: Self-pay

## 2023-09-29 ENCOUNTER — Other Ambulatory Visit: Payer: Self-pay

## 2023-09-30 ENCOUNTER — Other Ambulatory Visit (HOSPITAL_BASED_OUTPATIENT_CLINIC_OR_DEPARTMENT_OTHER): Payer: Self-pay

## 2023-09-30 MED ORDER — DEXCOM G7 SENSOR MISC
11 refills | Status: AC
  Filled 2023-09-30 – 2023-12-01 (×6): qty 3, 30d supply, fill #0
  Filled 2024-02-11 – 2024-02-22 (×3): qty 3, 30d supply, fill #1
  Filled 2024-03-29: qty 3, 30d supply, fill #2
  Filled 2024-04-24: qty 3, 30d supply, fill #3
  Filled 2024-05-22: qty 3, 30d supply, fill #4

## 2023-10-02 ENCOUNTER — Encounter: Payer: Self-pay | Admitting: Pharmacist

## 2023-10-02 ENCOUNTER — Other Ambulatory Visit: Payer: Self-pay

## 2023-10-02 ENCOUNTER — Other Ambulatory Visit (HOSPITAL_COMMUNITY): Payer: Self-pay

## 2023-10-08 ENCOUNTER — Other Ambulatory Visit: Payer: Self-pay

## 2023-10-14 ENCOUNTER — Other Ambulatory Visit: Payer: Self-pay

## 2023-10-14 ENCOUNTER — Other Ambulatory Visit (HOSPITAL_BASED_OUTPATIENT_CLINIC_OR_DEPARTMENT_OTHER): Payer: Self-pay

## 2023-10-14 ENCOUNTER — Other Ambulatory Visit (HOSPITAL_COMMUNITY): Payer: Self-pay

## 2023-10-15 ENCOUNTER — Encounter: Payer: Self-pay | Admitting: Pharmacist

## 2023-10-15 ENCOUNTER — Other Ambulatory Visit: Payer: Self-pay

## 2023-10-16 ENCOUNTER — Other Ambulatory Visit (HOSPITAL_BASED_OUTPATIENT_CLINIC_OR_DEPARTMENT_OTHER): Payer: Self-pay

## 2023-10-16 MED ORDER — MOUNJARO 5 MG/0.5ML ~~LOC~~ SOAJ
5.0000 mg | SUBCUTANEOUS | 0 refills | Status: AC
  Filled 2023-10-16: qty 2, 28d supply, fill #0

## 2023-10-19 ENCOUNTER — Other Ambulatory Visit (HOSPITAL_BASED_OUTPATIENT_CLINIC_OR_DEPARTMENT_OTHER): Payer: Self-pay

## 2023-10-20 ENCOUNTER — Other Ambulatory Visit: Payer: Self-pay

## 2023-10-21 ENCOUNTER — Other Ambulatory Visit: Payer: Self-pay

## 2023-10-21 ENCOUNTER — Other Ambulatory Visit (HOSPITAL_COMMUNITY): Payer: Self-pay

## 2023-10-21 ENCOUNTER — Other Ambulatory Visit (HOSPITAL_BASED_OUTPATIENT_CLINIC_OR_DEPARTMENT_OTHER): Payer: Self-pay

## 2023-10-30 ENCOUNTER — Other Ambulatory Visit (HOSPITAL_COMMUNITY): Payer: Self-pay

## 2023-10-30 ENCOUNTER — Other Ambulatory Visit (HOSPITAL_BASED_OUTPATIENT_CLINIC_OR_DEPARTMENT_OTHER): Payer: Self-pay

## 2023-10-30 ENCOUNTER — Other Ambulatory Visit: Payer: Self-pay

## 2023-11-05 ENCOUNTER — Other Ambulatory Visit (HOSPITAL_COMMUNITY): Payer: Self-pay

## 2023-11-09 ENCOUNTER — Other Ambulatory Visit (HOSPITAL_BASED_OUTPATIENT_CLINIC_OR_DEPARTMENT_OTHER): Payer: Self-pay

## 2023-11-09 ENCOUNTER — Other Ambulatory Visit (HOSPITAL_COMMUNITY): Payer: Self-pay

## 2023-11-09 ENCOUNTER — Other Ambulatory Visit: Payer: Self-pay

## 2023-11-09 MED ORDER — MOUNJARO 7.5 MG/0.5ML ~~LOC~~ SOAJ
7.5000 mg | SUBCUTANEOUS | 1 refills | Status: DC
  Filled 2023-11-09 – 2023-11-11 (×3): qty 2, 28d supply, fill #0

## 2023-11-11 ENCOUNTER — Encounter: Payer: Self-pay | Admitting: Pharmacist

## 2023-11-11 ENCOUNTER — Other Ambulatory Visit (HOSPITAL_COMMUNITY): Payer: Self-pay

## 2023-11-11 ENCOUNTER — Other Ambulatory Visit (HOSPITAL_BASED_OUTPATIENT_CLINIC_OR_DEPARTMENT_OTHER): Payer: Self-pay

## 2023-11-11 ENCOUNTER — Other Ambulatory Visit: Payer: Self-pay

## 2023-11-12 ENCOUNTER — Other Ambulatory Visit: Payer: Self-pay

## 2023-11-12 ENCOUNTER — Other Ambulatory Visit (HOSPITAL_BASED_OUTPATIENT_CLINIC_OR_DEPARTMENT_OTHER): Payer: Self-pay

## 2023-11-16 ENCOUNTER — Other Ambulatory Visit (HOSPITAL_BASED_OUTPATIENT_CLINIC_OR_DEPARTMENT_OTHER): Payer: Self-pay

## 2023-11-16 ENCOUNTER — Other Ambulatory Visit (HOSPITAL_COMMUNITY): Payer: Self-pay

## 2023-11-16 ENCOUNTER — Other Ambulatory Visit: Payer: Self-pay

## 2023-11-17 ENCOUNTER — Other Ambulatory Visit (HOSPITAL_BASED_OUTPATIENT_CLINIC_OR_DEPARTMENT_OTHER): Payer: Self-pay

## 2023-11-17 ENCOUNTER — Other Ambulatory Visit (HOSPITAL_COMMUNITY): Payer: Self-pay

## 2023-11-17 ENCOUNTER — Encounter: Payer: Self-pay | Admitting: Pharmacist

## 2023-11-17 ENCOUNTER — Other Ambulatory Visit: Payer: Self-pay

## 2023-11-18 ENCOUNTER — Other Ambulatory Visit (HOSPITAL_COMMUNITY): Payer: Self-pay

## 2023-11-18 ENCOUNTER — Other Ambulatory Visit: Payer: Self-pay

## 2023-11-18 ENCOUNTER — Other Ambulatory Visit (HOSPITAL_BASED_OUTPATIENT_CLINIC_OR_DEPARTMENT_OTHER): Payer: Self-pay

## 2023-11-18 MED ORDER — BD PEN NEEDLE MICRO ULTRAFINE 32G X 6 MM MISC
5 refills | Status: DC
  Filled 2023-11-18 – 2024-01-14 (×2): qty 100, 25d supply, fill #0
  Filled 2024-01-17: qty 100, 30d supply, fill #0
  Filled 2024-02-03: qty 100, 50d supply, fill #0
  Filled 2024-02-04 – 2024-03-03 (×2): qty 100, 25d supply, fill #0
  Filled 2024-03-29: qty 100, 30d supply, fill #0
  Filled 2024-04-03 – 2024-04-14 (×2): qty 100, 25d supply, fill #0
  Filled 2024-04-24: qty 100, 30d supply, fill #0
  Filled 2024-05-13: qty 100, 25d supply, fill #0

## 2023-11-18 MED ORDER — INSULIN PEN NEEDLE 32G X 6 MM MISC
1 refills | Status: AC
  Filled 2023-11-18: qty 100, 50d supply, fill #0

## 2023-11-19 ENCOUNTER — Other Ambulatory Visit (HOSPITAL_BASED_OUTPATIENT_CLINIC_OR_DEPARTMENT_OTHER): Payer: Self-pay

## 2023-11-19 MED ORDER — EMBECTA PEN NEEDLE ULTRAFINE 32G X 6 MM MISC
1 refills | Status: DC
  Filled 2023-11-19: qty 100, 30d supply, fill #0
  Filled 2024-02-07: qty 100, 90d supply, fill #0
  Filled 2024-02-08: qty 100, 25d supply, fill #0
  Filled 2024-03-04 (×2): qty 100, 25d supply, fill #1

## 2023-11-20 ENCOUNTER — Other Ambulatory Visit (HOSPITAL_COMMUNITY): Payer: Self-pay

## 2023-11-27 ENCOUNTER — Other Ambulatory Visit (HOSPITAL_COMMUNITY): Payer: Self-pay

## 2023-12-01 ENCOUNTER — Other Ambulatory Visit (HOSPITAL_COMMUNITY): Payer: Self-pay

## 2023-12-01 ENCOUNTER — Other Ambulatory Visit: Payer: Self-pay

## 2023-12-01 ENCOUNTER — Other Ambulatory Visit (HOSPITAL_BASED_OUTPATIENT_CLINIC_OR_DEPARTMENT_OTHER): Payer: Self-pay

## 2023-12-04 ENCOUNTER — Other Ambulatory Visit (HOSPITAL_BASED_OUTPATIENT_CLINIC_OR_DEPARTMENT_OTHER): Payer: Self-pay

## 2023-12-09 ENCOUNTER — Other Ambulatory Visit (HOSPITAL_BASED_OUTPATIENT_CLINIC_OR_DEPARTMENT_OTHER): Payer: Self-pay

## 2023-12-09 MED ORDER — MOUNJARO 10 MG/0.5ML ~~LOC~~ SOAJ
10.0000 mg | SUBCUTANEOUS | 1 refills | Status: DC
  Filled 2023-12-09 – 2023-12-10 (×2): qty 2, 28d supply, fill #0
  Filled 2024-01-02: qty 2, 28d supply, fill #1

## 2023-12-10 ENCOUNTER — Other Ambulatory Visit (HOSPITAL_BASED_OUTPATIENT_CLINIC_OR_DEPARTMENT_OTHER): Payer: Self-pay

## 2023-12-10 ENCOUNTER — Other Ambulatory Visit (HOSPITAL_COMMUNITY): Payer: Self-pay

## 2023-12-10 ENCOUNTER — Other Ambulatory Visit: Payer: Self-pay

## 2023-12-10 MED ORDER — MAGNESIUM OXIDE 400 MG PO TABS
200.0000 mg | ORAL_TABLET | Freq: Every day | ORAL | 5 refills | Status: AC
  Filled 2023-12-10 (×2): qty 30, 60d supply, fill #0
  Filled 2024-02-03 – 2024-02-04 (×2): qty 30, 60d supply, fill #1
  Filled 2024-04-03: qty 30, 60d supply, fill #2
  Filled 2024-05-23: qty 30, 60d supply, fill #3

## 2023-12-17 ENCOUNTER — Other Ambulatory Visit (HOSPITAL_COMMUNITY): Payer: Self-pay

## 2023-12-17 ENCOUNTER — Other Ambulatory Visit: Payer: Self-pay

## 2023-12-17 MED ORDER — PRAVASTATIN SODIUM 20 MG PO TABS
20.0000 mg | ORAL_TABLET | Freq: Every evening | ORAL | 1 refills | Status: DC
  Filled 2023-12-17: qty 30, 30d supply, fill #0
  Filled 2024-01-14 – 2024-01-15 (×2): qty 30, 30d supply, fill #1

## 2023-12-24 ENCOUNTER — Other Ambulatory Visit (HOSPITAL_COMMUNITY): Payer: Self-pay

## 2023-12-25 ENCOUNTER — Other Ambulatory Visit (HOSPITAL_COMMUNITY): Payer: Self-pay

## 2023-12-25 ENCOUNTER — Encounter (HOSPITAL_COMMUNITY): Payer: Self-pay | Admitting: Pharmacist

## 2023-12-29 ENCOUNTER — Other Ambulatory Visit (HOSPITAL_COMMUNITY): Payer: Self-pay

## 2023-12-30 ENCOUNTER — Other Ambulatory Visit (HOSPITAL_COMMUNITY): Payer: Self-pay

## 2023-12-31 ENCOUNTER — Other Ambulatory Visit (HOSPITAL_COMMUNITY): Payer: Self-pay

## 2024-01-02 ENCOUNTER — Other Ambulatory Visit (HOSPITAL_COMMUNITY): Payer: Self-pay

## 2024-01-04 ENCOUNTER — Other Ambulatory Visit (HOSPITAL_BASED_OUTPATIENT_CLINIC_OR_DEPARTMENT_OTHER): Payer: Self-pay

## 2024-01-14 ENCOUNTER — Other Ambulatory Visit (HOSPITAL_COMMUNITY): Payer: Self-pay

## 2024-01-14 ENCOUNTER — Other Ambulatory Visit: Payer: Self-pay

## 2024-01-17 ENCOUNTER — Other Ambulatory Visit (HOSPITAL_COMMUNITY): Payer: Self-pay

## 2024-01-18 ENCOUNTER — Other Ambulatory Visit (HOSPITAL_COMMUNITY): Payer: Self-pay

## 2024-01-18 ENCOUNTER — Other Ambulatory Visit: Payer: Self-pay

## 2024-01-18 ENCOUNTER — Other Ambulatory Visit (HOSPITAL_BASED_OUTPATIENT_CLINIC_OR_DEPARTMENT_OTHER): Payer: Self-pay

## 2024-01-18 MED ORDER — BD PEN NEEDLE MICRO ULTRAFINE 32G X 6 MM MISC
4 refills | Status: DC
  Filled 2024-01-18: qty 100, 30d supply, fill #0
  Filled 2024-02-03: qty 100, fill #0

## 2024-01-19 ENCOUNTER — Other Ambulatory Visit (HOSPITAL_COMMUNITY): Payer: Self-pay

## 2024-01-27 ENCOUNTER — Other Ambulatory Visit: Payer: Self-pay

## 2024-01-27 ENCOUNTER — Other Ambulatory Visit (HOSPITAL_COMMUNITY): Payer: Self-pay

## 2024-01-28 ENCOUNTER — Other Ambulatory Visit (HOSPITAL_COMMUNITY): Payer: Self-pay

## 2024-01-28 ENCOUNTER — Other Ambulatory Visit (HOSPITAL_BASED_OUTPATIENT_CLINIC_OR_DEPARTMENT_OTHER): Payer: Self-pay

## 2024-01-29 ENCOUNTER — Other Ambulatory Visit (HOSPITAL_COMMUNITY): Payer: Self-pay

## 2024-02-02 ENCOUNTER — Other Ambulatory Visit (HOSPITAL_COMMUNITY): Payer: Self-pay

## 2024-02-03 ENCOUNTER — Other Ambulatory Visit (HOSPITAL_BASED_OUTPATIENT_CLINIC_OR_DEPARTMENT_OTHER): Payer: Self-pay

## 2024-02-03 ENCOUNTER — Other Ambulatory Visit: Payer: Self-pay

## 2024-02-03 MED ORDER — AZELASTINE HCL 0.1 % NA SOLN
1.0000 | Freq: Two times a day (BID) | NASAL | 0 refills | Status: AC
  Filled 2024-02-03 – 2024-02-08 (×2): qty 30, 30d supply, fill #0

## 2024-02-04 ENCOUNTER — Other Ambulatory Visit (HOSPITAL_COMMUNITY): Payer: Self-pay

## 2024-02-04 ENCOUNTER — Other Ambulatory Visit: Payer: Self-pay

## 2024-02-05 ENCOUNTER — Other Ambulatory Visit (HOSPITAL_COMMUNITY): Payer: Self-pay

## 2024-02-05 ENCOUNTER — Other Ambulatory Visit (HOSPITAL_BASED_OUTPATIENT_CLINIC_OR_DEPARTMENT_OTHER): Payer: Self-pay

## 2024-02-05 MED ORDER — MOUNJARO 10 MG/0.5ML ~~LOC~~ SOAJ
10.0000 mg | SUBCUTANEOUS | 1 refills | Status: AC
  Filled 2024-02-05 – 2024-02-08 (×2): qty 2, 28d supply, fill #0
  Filled 2024-03-03 – 2024-03-29 (×3): qty 2, 28d supply, fill #1

## 2024-02-07 ENCOUNTER — Other Ambulatory Visit (HOSPITAL_COMMUNITY): Payer: Self-pay

## 2024-02-08 ENCOUNTER — Other Ambulatory Visit (HOSPITAL_COMMUNITY): Payer: Self-pay

## 2024-02-08 ENCOUNTER — Other Ambulatory Visit: Payer: Self-pay

## 2024-02-08 ENCOUNTER — Other Ambulatory Visit (HOSPITAL_BASED_OUTPATIENT_CLINIC_OR_DEPARTMENT_OTHER): Payer: Self-pay

## 2024-02-08 MED ORDER — IPRATROPIUM-ALBUTEROL 0.5-2.5 (3) MG/3ML IN SOLN
3.0000 mL | RESPIRATORY_TRACT | 3 refills | Status: AC | PRN
  Filled 2024-02-08: qty 360, 20d supply, fill #0

## 2024-02-09 ENCOUNTER — Other Ambulatory Visit: Payer: Self-pay

## 2024-02-09 ENCOUNTER — Other Ambulatory Visit (HOSPITAL_COMMUNITY): Payer: Self-pay

## 2024-02-10 ENCOUNTER — Other Ambulatory Visit (HOSPITAL_COMMUNITY): Payer: Self-pay

## 2024-02-10 MED ORDER — PRAVASTATIN SODIUM 20 MG PO TABS
20.0000 mg | ORAL_TABLET | Freq: Every evening | ORAL | 0 refills | Status: DC
  Filled 2024-02-10 – 2024-02-14 (×2): qty 30, 30d supply, fill #0

## 2024-02-11 ENCOUNTER — Other Ambulatory Visit (HOSPITAL_BASED_OUTPATIENT_CLINIC_OR_DEPARTMENT_OTHER): Payer: Self-pay

## 2024-02-14 ENCOUNTER — Other Ambulatory Visit (HOSPITAL_COMMUNITY): Payer: Self-pay

## 2024-02-15 ENCOUNTER — Other Ambulatory Visit: Payer: Self-pay

## 2024-02-15 ENCOUNTER — Encounter: Payer: Self-pay | Admitting: Pharmacist

## 2024-02-16 ENCOUNTER — Encounter (HOSPITAL_COMMUNITY): Payer: Self-pay

## 2024-02-16 ENCOUNTER — Other Ambulatory Visit (HOSPITAL_COMMUNITY): Payer: Self-pay

## 2024-02-17 ENCOUNTER — Other Ambulatory Visit: Payer: Self-pay

## 2024-02-22 ENCOUNTER — Other Ambulatory Visit (HOSPITAL_BASED_OUTPATIENT_CLINIC_OR_DEPARTMENT_OTHER): Payer: Self-pay

## 2024-02-22 ENCOUNTER — Other Ambulatory Visit (HOSPITAL_COMMUNITY): Payer: Self-pay

## 2024-02-23 ENCOUNTER — Other Ambulatory Visit: Payer: Self-pay

## 2024-02-23 ENCOUNTER — Other Ambulatory Visit (HOSPITAL_COMMUNITY): Payer: Self-pay

## 2024-02-23 ENCOUNTER — Other Ambulatory Visit (HOSPITAL_BASED_OUTPATIENT_CLINIC_OR_DEPARTMENT_OTHER): Payer: Self-pay

## 2024-02-23 MED ORDER — GLIPIZIDE 10 MG PO TABS
10.0000 mg | ORAL_TABLET | Freq: Two times a day (BID) | ORAL | 5 refills | Status: AC
  Filled 2024-02-23 (×2): qty 60, 30d supply, fill #0
  Filled 2024-03-29: qty 60, 30d supply, fill #1
  Filled 2024-04-24: qty 60, 30d supply, fill #2
  Filled 2024-06-06: qty 60, 30d supply, fill #3

## 2024-03-03 ENCOUNTER — Other Ambulatory Visit (HOSPITAL_COMMUNITY): Payer: Self-pay

## 2024-03-03 ENCOUNTER — Other Ambulatory Visit: Payer: Self-pay

## 2024-03-04 ENCOUNTER — Other Ambulatory Visit (HOSPITAL_COMMUNITY): Payer: Self-pay

## 2024-03-04 ENCOUNTER — Other Ambulatory Visit: Payer: Self-pay

## 2024-03-05 ENCOUNTER — Other Ambulatory Visit (HOSPITAL_COMMUNITY): Payer: Self-pay

## 2024-03-10 ENCOUNTER — Other Ambulatory Visit (HOSPITAL_COMMUNITY): Payer: Self-pay

## 2024-03-16 ENCOUNTER — Other Ambulatory Visit (HOSPITAL_COMMUNITY): Payer: Self-pay

## 2024-03-16 ENCOUNTER — Other Ambulatory Visit: Payer: Self-pay

## 2024-03-25 ENCOUNTER — Other Ambulatory Visit (HOSPITAL_COMMUNITY): Payer: Self-pay

## 2024-03-25 ENCOUNTER — Other Ambulatory Visit (HOSPITAL_BASED_OUTPATIENT_CLINIC_OR_DEPARTMENT_OTHER): Payer: Self-pay

## 2024-03-29 ENCOUNTER — Other Ambulatory Visit (HOSPITAL_BASED_OUTPATIENT_CLINIC_OR_DEPARTMENT_OTHER): Payer: Self-pay

## 2024-03-29 ENCOUNTER — Other Ambulatory Visit: Payer: Self-pay

## 2024-03-29 ENCOUNTER — Other Ambulatory Visit (HOSPITAL_COMMUNITY): Payer: Self-pay

## 2024-03-30 ENCOUNTER — Other Ambulatory Visit: Payer: Self-pay

## 2024-03-31 ENCOUNTER — Other Ambulatory Visit (HOSPITAL_COMMUNITY): Payer: Self-pay

## 2024-03-31 ENCOUNTER — Other Ambulatory Visit: Payer: Self-pay

## 2024-03-31 MED ORDER — TECHLITE PEN NEEDLES 32G X 6 MM MISC
1.0000 | Freq: Four times a day (QID) | 1 refills | Status: AC
  Filled 2024-03-31: qty 100, 25d supply, fill #0
  Filled 2024-05-16: qty 100, 25d supply, fill #1

## 2024-04-03 ENCOUNTER — Other Ambulatory Visit (HOSPITAL_COMMUNITY): Payer: Self-pay

## 2024-04-04 ENCOUNTER — Other Ambulatory Visit: Payer: Self-pay

## 2024-04-04 ENCOUNTER — Other Ambulatory Visit (HOSPITAL_COMMUNITY): Payer: Self-pay

## 2024-04-05 ENCOUNTER — Other Ambulatory Visit: Payer: Self-pay

## 2024-04-14 ENCOUNTER — Other Ambulatory Visit: Payer: Self-pay

## 2024-04-14 ENCOUNTER — Other Ambulatory Visit (HOSPITAL_COMMUNITY): Payer: Self-pay

## 2024-04-15 ENCOUNTER — Other Ambulatory Visit: Payer: Self-pay

## 2024-04-15 ENCOUNTER — Other Ambulatory Visit (HOSPITAL_COMMUNITY): Payer: Self-pay

## 2024-04-15 MED ORDER — MOUNJARO 12.5 MG/0.5ML ~~LOC~~ SOAJ
12.5000 mg | SUBCUTANEOUS | 0 refills | Status: AC
  Filled 2024-04-15: qty 2, 28d supply, fill #0
  Filled 2024-05-13: qty 2, 28d supply, fill #1

## 2024-04-15 MED ORDER — NOVOLOG MIX 70/30 FLEXPEN (70-30) 100 UNIT/ML ~~LOC~~ SUPN
30.0000 [IU] | PEN_INJECTOR | Freq: Two times a day (BID) | SUBCUTANEOUS | 3 refills | Status: AC
  Filled 2024-04-15: qty 15, 25d supply, fill #0
  Filled 2024-05-13: qty 15, 25d supply, fill #1
  Filled 2024-06-06: qty 15, 25d supply, fill #2

## 2024-04-16 ENCOUNTER — Other Ambulatory Visit (HOSPITAL_COMMUNITY): Payer: Self-pay

## 2024-04-18 ENCOUNTER — Other Ambulatory Visit (HOSPITAL_BASED_OUTPATIENT_CLINIC_OR_DEPARTMENT_OTHER): Payer: Self-pay

## 2024-04-18 ENCOUNTER — Other Ambulatory Visit (HOSPITAL_COMMUNITY): Payer: Self-pay

## 2024-04-24 ENCOUNTER — Other Ambulatory Visit (HOSPITAL_COMMUNITY): Payer: Self-pay

## 2024-04-25 ENCOUNTER — Other Ambulatory Visit: Payer: Self-pay

## 2024-05-11 ENCOUNTER — Other Ambulatory Visit (HOSPITAL_COMMUNITY): Payer: Self-pay

## 2024-05-11 ENCOUNTER — Other Ambulatory Visit: Payer: Self-pay

## 2024-05-11 MED ORDER — SPIRIVA RESPIMAT 1.25 MCG/ACT IN AERS
2.0000 | INHALATION_SPRAY | Freq: Every day | RESPIRATORY_TRACT | 2 refills | Status: AC
  Filled 2024-05-11: qty 4, 30d supply, fill #0
  Filled 2024-05-13 – 2024-06-09 (×2): qty 4, 30d supply, fill #1

## 2024-05-13 ENCOUNTER — Other Ambulatory Visit: Payer: Self-pay

## 2024-05-13 ENCOUNTER — Other Ambulatory Visit (HOSPITAL_COMMUNITY): Payer: Self-pay

## 2024-05-14 ENCOUNTER — Other Ambulatory Visit (HOSPITAL_COMMUNITY): Payer: Self-pay

## 2024-05-14 MED ORDER — PRAVASTATIN SODIUM 20 MG PO TABS
20.0000 mg | ORAL_TABLET | Freq: Every evening | ORAL | 0 refills | Status: AC
  Filled 2024-05-14: qty 30, 30d supply, fill #0

## 2024-05-16 ENCOUNTER — Other Ambulatory Visit: Payer: Self-pay

## 2024-05-20 ENCOUNTER — Other Ambulatory Visit: Payer: Self-pay

## 2024-05-20 ENCOUNTER — Other Ambulatory Visit (HOSPITAL_COMMUNITY): Payer: Self-pay

## 2024-05-20 MED ORDER — LOSARTAN POTASSIUM-HCTZ 100-25 MG PO TABS
1.0000 | ORAL_TABLET | Freq: Every day | ORAL | 0 refills | Status: AC
  Filled 2024-05-20: qty 90, 90d supply, fill #0

## 2024-05-22 ENCOUNTER — Other Ambulatory Visit (HOSPITAL_COMMUNITY): Payer: Self-pay

## 2024-05-23 ENCOUNTER — Other Ambulatory Visit (HOSPITAL_BASED_OUTPATIENT_CLINIC_OR_DEPARTMENT_OTHER): Payer: Self-pay

## 2024-05-23 ENCOUNTER — Other Ambulatory Visit: Payer: Self-pay

## 2024-05-23 ENCOUNTER — Other Ambulatory Visit (HOSPITAL_COMMUNITY): Payer: Self-pay

## 2024-05-23 MED ORDER — CELECOXIB 200 MG PO CAPS
200.0000 mg | ORAL_CAPSULE | Freq: Every day | ORAL | 11 refills | Status: AC
  Filled 2024-05-23: qty 30, 30d supply, fill #0

## 2024-05-24 ENCOUNTER — Other Ambulatory Visit (HOSPITAL_BASED_OUTPATIENT_CLINIC_OR_DEPARTMENT_OTHER): Payer: Self-pay

## 2024-06-01 ENCOUNTER — Other Ambulatory Visit (HOSPITAL_BASED_OUTPATIENT_CLINIC_OR_DEPARTMENT_OTHER): Payer: Self-pay

## 2024-06-01 ENCOUNTER — Other Ambulatory Visit: Payer: Self-pay

## 2024-06-01 ENCOUNTER — Other Ambulatory Visit (HOSPITAL_COMMUNITY): Payer: Self-pay

## 2024-06-01 MED ORDER — DEXCOM G7 SENSOR MISC
1 refills | Status: AC
  Filled 2024-06-01 (×2): qty 3, 30d supply, fill #0

## 2024-06-02 ENCOUNTER — Other Ambulatory Visit: Payer: Self-pay

## 2024-06-02 ENCOUNTER — Other Ambulatory Visit (HOSPITAL_BASED_OUTPATIENT_CLINIC_OR_DEPARTMENT_OTHER): Payer: Self-pay

## 2024-06-06 ENCOUNTER — Other Ambulatory Visit: Payer: Self-pay

## 2024-06-08 ENCOUNTER — Other Ambulatory Visit: Payer: Self-pay

## 2024-06-09 ENCOUNTER — Other Ambulatory Visit: Payer: Self-pay

## 2024-06-09 ENCOUNTER — Other Ambulatory Visit (HOSPITAL_COMMUNITY): Payer: Self-pay

## 2024-07-04 ENCOUNTER — Encounter: Admitting: Obstetrics and Gynecology
